# Patient Record
Sex: Female | Born: 1945
Health system: Southern US, Community
[De-identification: ages and names within clinical notes are randomized; demographics above are authoritative.]

## PROBLEM LIST (undated history)

## (undated) DIAGNOSIS — M1990 Unspecified osteoarthritis, unspecified site: Secondary | ICD-10-CM

## (undated) DIAGNOSIS — K219 Gastro-esophageal reflux disease without esophagitis: Secondary | ICD-10-CM

## (undated) DIAGNOSIS — E119 Type 2 diabetes mellitus without complications: Secondary | ICD-10-CM

## (undated) DIAGNOSIS — T7840XA Allergy, unspecified, initial encounter: Secondary | ICD-10-CM

## (undated) DIAGNOSIS — I10 Essential (primary) hypertension: Secondary | ICD-10-CM

## (undated) DIAGNOSIS — Z8619 Personal history of other infectious and parasitic diseases: Secondary | ICD-10-CM

## (undated) DIAGNOSIS — E785 Hyperlipidemia, unspecified: Secondary | ICD-10-CM

## (undated) DIAGNOSIS — R499 Unspecified voice and resonance disorder: Secondary | ICD-10-CM

## (undated) DIAGNOSIS — Z860100 Personal history of colon polyps, unspecified: Secondary | ICD-10-CM

## (undated) DIAGNOSIS — R251 Tremor, unspecified: Secondary | ICD-10-CM

## (undated) DIAGNOSIS — R739 Hyperglycemia, unspecified: Secondary | ICD-10-CM

## (undated) DIAGNOSIS — F419 Anxiety disorder, unspecified: Secondary | ICD-10-CM

## (undated) DIAGNOSIS — F410 Panic disorder [episodic paroxysmal anxiety] without agoraphobia: Secondary | ICD-10-CM

## (undated) HISTORY — DX: Panic disorder (episodic paroxysmal anxiety): F41.0

## (undated) HISTORY — PX: COLON SURGERY: SHX602

## (undated) HISTORY — PX: RECTOCELE REPAIR: SHX761

## (undated) HISTORY — DX: Anxiety disorder, unspecified: F41.9

## (undated) HISTORY — PX: ABDOMINAL HYSTERECTOMY: SHX81

## (undated) HISTORY — DX: Gastro-esophageal reflux disease without esophagitis: K21.9

## (undated) HISTORY — DX: Hyperglycemia, unspecified: R73.9

## (undated) HISTORY — PX: NASAL SINUS SURGERY: SHX719

## (undated) HISTORY — PX: TONSILLECTOMY: SUR1361

## (undated) HISTORY — PX: JOINT REPLACEMENT: SHX530

## (undated) HISTORY — PX: BLADDER SUSPENSION: SHX72

## (undated) HISTORY — DX: Personal history of colon polyps, unspecified: Z86.0100

## (undated) HISTORY — DX: Allergy, unspecified, initial encounter: T78.40XA

## (undated) HISTORY — DX: Unspecified voice and resonance disorder: R49.9

## (undated) HISTORY — DX: Personal history of other infectious and parasitic diseases: Z86.19

## (undated) HISTORY — DX: Tremor, unspecified: R25.1

## (undated) HISTORY — PX: PARTIAL HYSTERECTOMY: SHX80

## (undated) HISTORY — DX: Hyperlipidemia, unspecified: E78.5

## (undated) HISTORY — DX: Essential (primary) hypertension: I10

## (undated) HISTORY — DX: Personal history of colonic polyps: Z86.010

---

## 1998-09-17 HISTORY — PX: EYE SURGERY: SHX253

## 2004-09-18 LAB — HM DEXA SCAN: HM Dexa Scan: NORMAL

## 2004-09-27 ENCOUNTER — Ambulatory Visit: Payer: Self-pay | Admitting: Unknown Physician Specialty

## 2005-02-28 ENCOUNTER — Ambulatory Visit: Payer: Self-pay | Admitting: Unknown Physician Specialty

## 2005-04-30 ENCOUNTER — Ambulatory Visit: Payer: Self-pay | Admitting: Unknown Physician Specialty

## 2006-04-04 ENCOUNTER — Ambulatory Visit: Payer: Self-pay | Admitting: Unknown Physician Specialty

## 2006-04-05 ENCOUNTER — Ambulatory Visit: Payer: Self-pay | Admitting: Unknown Physician Specialty

## 2006-05-24 ENCOUNTER — Ambulatory Visit: Payer: Self-pay | Admitting: Unknown Physician Specialty

## 2006-06-06 ENCOUNTER — Ambulatory Visit: Payer: Self-pay | Admitting: Family Medicine

## 2006-06-07 ENCOUNTER — Ambulatory Visit: Payer: Self-pay | Admitting: Family Medicine

## 2006-06-12 ENCOUNTER — Ambulatory Visit: Payer: Self-pay | Admitting: Family Medicine

## 2006-06-14 ENCOUNTER — Ambulatory Visit: Payer: Self-pay | Admitting: Family Medicine

## 2006-06-14 ENCOUNTER — Other Ambulatory Visit: Admission: RE | Admit: 2006-06-14 | Discharge: 2006-06-14 | Payer: Self-pay | Admitting: Family Medicine

## 2006-06-14 ENCOUNTER — Encounter: Payer: Self-pay | Admitting: Family Medicine

## 2006-07-08 ENCOUNTER — Ambulatory Visit: Payer: Self-pay | Admitting: Cardiovascular Disease

## 2006-07-19 ENCOUNTER — Ambulatory Visit: Payer: Self-pay | Admitting: Unknown Physician Specialty

## 2006-07-19 LAB — HM COLONOSCOPY: HM Colonoscopy: ABNORMAL

## 2006-08-06 ENCOUNTER — Ambulatory Visit: Payer: Self-pay | Admitting: Family Medicine

## 2006-09-02 ENCOUNTER — Ambulatory Visit: Payer: Self-pay | Admitting: Cardiology

## 2006-10-21 ENCOUNTER — Ambulatory Visit: Payer: Self-pay | Admitting: Family Medicine

## 2007-01-20 ENCOUNTER — Encounter: Payer: Self-pay | Admitting: Family Medicine

## 2007-01-20 DIAGNOSIS — B029 Zoster without complications: Secondary | ICD-10-CM | POA: Insufficient documentation

## 2007-01-20 DIAGNOSIS — R498 Other voice and resonance disorders: Secondary | ICD-10-CM | POA: Insufficient documentation

## 2007-01-20 DIAGNOSIS — R059 Cough, unspecified: Secondary | ICD-10-CM | POA: Insufficient documentation

## 2007-01-20 DIAGNOSIS — R05 Cough: Secondary | ICD-10-CM | POA: Insufficient documentation

## 2007-01-20 DIAGNOSIS — T7840XA Allergy, unspecified, initial encounter: Secondary | ICD-10-CM | POA: Insufficient documentation

## 2007-01-28 DIAGNOSIS — E785 Hyperlipidemia, unspecified: Secondary | ICD-10-CM | POA: Insufficient documentation

## 2007-01-28 DIAGNOSIS — Z860100 Personal history of colon polyps, unspecified: Secondary | ICD-10-CM | POA: Insufficient documentation

## 2007-01-28 DIAGNOSIS — I10 Essential (primary) hypertension: Secondary | ICD-10-CM

## 2007-01-28 DIAGNOSIS — R32 Unspecified urinary incontinence: Secondary | ICD-10-CM | POA: Insufficient documentation

## 2007-01-28 DIAGNOSIS — K219 Gastro-esophageal reflux disease without esophagitis: Secondary | ICD-10-CM

## 2007-01-28 DIAGNOSIS — F411 Generalized anxiety disorder: Secondary | ICD-10-CM | POA: Insufficient documentation

## 2007-05-28 ENCOUNTER — Ambulatory Visit: Payer: Self-pay | Admitting: Family Medicine

## 2007-06-23 ENCOUNTER — Ambulatory Visit: Payer: Self-pay | Admitting: Family Medicine

## 2007-07-01 ENCOUNTER — Ambulatory Visit: Payer: Self-pay | Admitting: Professional

## 2007-07-08 ENCOUNTER — Ambulatory Visit: Payer: Self-pay | Admitting: Professional

## 2007-07-14 ENCOUNTER — Ambulatory Visit: Payer: Self-pay | Admitting: Professional

## 2007-07-14 ENCOUNTER — Telehealth: Payer: Self-pay | Admitting: Family Medicine

## 2007-07-22 ENCOUNTER — Ambulatory Visit: Payer: Self-pay | Admitting: Professional

## 2007-07-28 ENCOUNTER — Telehealth: Payer: Self-pay | Admitting: Family Medicine

## 2007-07-31 ENCOUNTER — Ambulatory Visit: Payer: Self-pay | Admitting: Professional

## 2007-08-04 ENCOUNTER — Ambulatory Visit: Payer: Self-pay | Admitting: Family Medicine

## 2007-08-04 DIAGNOSIS — F329 Major depressive disorder, single episode, unspecified: Secondary | ICD-10-CM

## 2007-08-06 LAB — CONVERTED CEMR LAB
ALT: 38 units/L — ABNORMAL HIGH (ref 0–35)
AST: 26 units/L (ref 0–37)
Albumin: 3.6 g/dL (ref 3.5–5.2)
Basophils Absolute: 0 10*3/uL (ref 0.0–0.1)
Bilirubin, Direct: 0.1 mg/dL (ref 0.0–0.3)
Calcium: 9.1 mg/dL (ref 8.4–10.5)
Chloride: 105 meq/L (ref 96–112)
Eosinophils Absolute: 0.1 10*3/uL (ref 0.0–0.6)
Eosinophils Relative: 1 % (ref 0.0–5.0)
GFR calc Af Amer: 94 mL/min
GFR calc non Af Amer: 78 mL/min
Glucose, Bld: 108 mg/dL — ABNORMAL HIGH (ref 70–99)
Lymphocytes Relative: 20.6 % (ref 12.0–46.0)
MCHC: 34.2 g/dL (ref 30.0–36.0)
MCV: 91.2 fL (ref 78.0–100.0)
Monocytes Relative: 7.1 % (ref 3.0–11.0)
Neutro Abs: 7.2 10*3/uL (ref 1.4–7.7)
Platelets: 326 10*3/uL (ref 150–400)
RBC: 4.82 M/uL (ref 3.87–5.11)
TSH: 3 microintl units/mL (ref 0.35–5.50)
WBC: 10.1 10*3/uL (ref 4.5–10.5)

## 2007-08-07 ENCOUNTER — Ambulatory Visit: Payer: Self-pay | Admitting: Professional

## 2007-08-19 ENCOUNTER — Ambulatory Visit: Payer: Self-pay | Admitting: Professional

## 2007-08-22 ENCOUNTER — Ambulatory Visit: Payer: Self-pay | Admitting: Family Medicine

## 2007-08-25 ENCOUNTER — Encounter (INDEPENDENT_AMBULATORY_CARE_PROVIDER_SITE_OTHER): Payer: Self-pay | Admitting: *Deleted

## 2007-08-25 LAB — FECAL OCCULT BLOOD, GUAIAC: Fecal Occult Blood: NEGATIVE

## 2007-09-02 ENCOUNTER — Ambulatory Visit: Payer: Self-pay | Admitting: Professional

## 2007-09-23 ENCOUNTER — Ambulatory Visit: Payer: Self-pay | Admitting: Professional

## 2007-10-09 ENCOUNTER — Encounter: Payer: Self-pay | Admitting: Family Medicine

## 2007-10-09 ENCOUNTER — Ambulatory Visit: Payer: Self-pay | Admitting: Family Medicine

## 2007-10-10 ENCOUNTER — Encounter (INDEPENDENT_AMBULATORY_CARE_PROVIDER_SITE_OTHER): Payer: Self-pay | Admitting: *Deleted

## 2007-12-02 ENCOUNTER — Ambulatory Visit: Payer: Self-pay | Admitting: Family Medicine

## 2007-12-02 DIAGNOSIS — R259 Unspecified abnormal involuntary movements: Secondary | ICD-10-CM | POA: Insufficient documentation

## 2007-12-02 DIAGNOSIS — R7303 Prediabetes: Secondary | ICD-10-CM | POA: Insufficient documentation

## 2007-12-02 DIAGNOSIS — R739 Hyperglycemia, unspecified: Secondary | ICD-10-CM

## 2007-12-04 LAB — CONVERTED CEMR LAB
Glucose, 1 Hour GTT: 160 mg/dL (ref 120–170)
Glucose, 2 hour: 140 mg/dL — ABNORMAL HIGH (ref 70–139)
Glucose, GTT - 3 Hour: 106 mg/dL

## 2007-12-17 ENCOUNTER — Ambulatory Visit: Payer: Self-pay | Admitting: Family Medicine

## 2008-03-18 ENCOUNTER — Ambulatory Visit: Payer: Self-pay | Admitting: Family Medicine

## 2008-03-24 ENCOUNTER — Ambulatory Visit: Payer: Self-pay | Admitting: Family Medicine

## 2008-04-21 ENCOUNTER — Telehealth: Payer: Self-pay | Admitting: Family Medicine

## 2008-04-22 ENCOUNTER — Telehealth (INDEPENDENT_AMBULATORY_CARE_PROVIDER_SITE_OTHER): Payer: Self-pay | Admitting: *Deleted

## 2008-04-26 ENCOUNTER — Ambulatory Visit: Payer: Self-pay | Admitting: Family Medicine

## 2008-04-27 LAB — CONVERTED CEMR LAB
Albumin: 3.7 g/dL (ref 3.5–5.2)
BUN: 20 mg/dL (ref 6–23)
Calcium: 9.2 mg/dL (ref 8.4–10.5)
Glucose, Bld: 105 mg/dL — ABNORMAL HIGH (ref 70–99)
Phosphorus: 3.7 mg/dL (ref 2.3–4.6)
Sodium: 139 meq/L (ref 135–145)

## 2008-06-18 ENCOUNTER — Encounter (INDEPENDENT_AMBULATORY_CARE_PROVIDER_SITE_OTHER): Payer: Self-pay | Admitting: *Deleted

## 2008-08-25 ENCOUNTER — Ambulatory Visit: Payer: Self-pay | Admitting: Family Medicine

## 2008-09-06 ENCOUNTER — Telehealth: Payer: Self-pay | Admitting: Family Medicine

## 2008-09-28 ENCOUNTER — Ambulatory Visit: Payer: Self-pay | Admitting: Family Medicine

## 2008-09-29 LAB — CONVERTED CEMR LAB
ALT: 20 units/L (ref 0–35)
Albumin: 3.9 g/dL (ref 3.5–5.2)
BUN: 17 mg/dL (ref 6–23)
Basophils Relative: 0.5 % (ref 0.0–3.0)
Bilirubin, Direct: 0.1 mg/dL (ref 0.0–0.3)
CO2: 29 meq/L (ref 19–32)
Calcium: 9.6 mg/dL (ref 8.4–10.5)
Creatinine, Ser: 0.9 mg/dL (ref 0.4–1.2)
Direct LDL: 251.3 mg/dL
Eosinophils Relative: 2.1 % (ref 0.0–5.0)
Glucose, Bld: 109 mg/dL — ABNORMAL HIGH (ref 70–99)
HCT: 44 % (ref 36.0–46.0)
Hemoglobin: 15.2 g/dL — ABNORMAL HIGH (ref 12.0–15.0)
Hgb A1c MFr Bld: 6.1 % — ABNORMAL HIGH (ref 4.6–6.0)
Lymphocytes Relative: 30.4 % (ref 12.0–46.0)
Monocytes Relative: 10.2 % (ref 3.0–12.0)
Neutro Abs: 4.4 10*3/uL (ref 1.4–7.7)
RBC: 4.96 M/uL (ref 3.87–5.11)
Total CHOL/HDL Ratio: 8.2
Total Protein: 6.9 g/dL (ref 6.0–8.3)
VLDL: 34 mg/dL (ref 0–40)

## 2008-10-04 ENCOUNTER — Telehealth: Payer: Self-pay | Admitting: Family Medicine

## 2008-10-04 ENCOUNTER — Ambulatory Visit: Payer: Self-pay | Admitting: Family Medicine

## 2008-10-04 DIAGNOSIS — N951 Menopausal and female climacteric states: Secondary | ICD-10-CM

## 2008-10-13 ENCOUNTER — Ambulatory Visit: Payer: Self-pay | Admitting: Family Medicine

## 2008-10-13 ENCOUNTER — Encounter: Payer: Self-pay | Admitting: Family Medicine

## 2008-10-15 ENCOUNTER — Encounter (INDEPENDENT_AMBULATORY_CARE_PROVIDER_SITE_OTHER): Payer: Self-pay | Admitting: *Deleted

## 2008-11-15 ENCOUNTER — Telehealth: Payer: Self-pay | Admitting: Family Medicine

## 2009-03-08 ENCOUNTER — Telehealth: Payer: Self-pay | Admitting: Family Medicine

## 2009-03-14 ENCOUNTER — Ambulatory Visit: Payer: Self-pay | Admitting: Family Medicine

## 2009-03-16 LAB — CONVERTED CEMR LAB
BUN: 16 mg/dL (ref 6–23)
Chloride: 105 meq/L (ref 96–112)
Creatinine, Ser: 0.9 mg/dL (ref 0.4–1.2)
Hgb A1c MFr Bld: 6.1 % (ref 4.6–6.5)

## 2009-03-23 ENCOUNTER — Ambulatory Visit: Payer: Self-pay | Admitting: Family Medicine

## 2009-05-25 ENCOUNTER — Ambulatory Visit: Payer: Self-pay | Admitting: Family Medicine

## 2009-06-29 ENCOUNTER — Telehealth: Payer: Self-pay | Admitting: Family Medicine

## 2009-07-19 ENCOUNTER — Ambulatory Visit: Payer: Self-pay | Admitting: Family Medicine

## 2009-09-20 ENCOUNTER — Ambulatory Visit: Payer: Self-pay | Admitting: Family Medicine

## 2009-09-20 LAB — CONVERTED CEMR LAB
Alkaline Phosphatase: 67 units/L (ref 39–117)
Basophils Absolute: 0 10*3/uL (ref 0.0–0.1)
Bilirubin, Direct: 0.1 mg/dL (ref 0.0–0.3)
CO2: 28 meq/L (ref 19–32)
Calcium: 9.7 mg/dL (ref 8.4–10.5)
Creatinine, Ser: 0.9 mg/dL (ref 0.4–1.2)
Eosinophils Absolute: 0.2 10*3/uL (ref 0.0–0.7)
Glucose, Bld: 105 mg/dL — ABNORMAL HIGH (ref 70–99)
HDL: 46.1 mg/dL (ref >39.00)
Hgb A1c MFr Bld: 6.1 % (ref 4.6–6.5)
Lymphocytes Relative: 30.5 % (ref 12.0–46.0)
MCHC: 32.7 g/dL (ref 30.0–36.0)
Neutrophils Relative %: 56.1 % (ref 43.0–77.0)
RBC: 4.97 M/uL (ref 3.87–5.11)
RDW: 12 % (ref 11.5–14.6)
Total Bilirubin: 1.1 mg/dL (ref 0.3–1.2)
Total CHOL/HDL Ratio: 7
Triglycerides: 162 mg/dL — ABNORMAL HIGH (ref 0.0–149.0)

## 2009-10-19 ENCOUNTER — Ambulatory Visit: Payer: Self-pay | Admitting: Family Medicine

## 2009-11-11 ENCOUNTER — Encounter: Payer: Self-pay | Admitting: Family Medicine

## 2009-11-11 ENCOUNTER — Ambulatory Visit: Payer: Self-pay | Admitting: Family Medicine

## 2009-11-11 LAB — HM MAMMOGRAPHY: HM Mammogram: NORMAL

## 2009-11-16 ENCOUNTER — Encounter (INDEPENDENT_AMBULATORY_CARE_PROVIDER_SITE_OTHER): Payer: Self-pay | Admitting: *Deleted

## 2010-04-11 ENCOUNTER — Ambulatory Visit: Payer: Self-pay | Admitting: Family Medicine

## 2010-04-18 ENCOUNTER — Telehealth: Payer: Self-pay | Admitting: Family Medicine

## 2010-07-10 ENCOUNTER — Telehealth: Payer: Self-pay | Admitting: Family Medicine

## 2010-07-11 ENCOUNTER — Ambulatory Visit: Payer: Self-pay | Admitting: Family Medicine

## 2010-10-17 NOTE — Progress Notes (Signed)
Summary: wants labs  Phone Note Call from Patient Call back at Home Phone [phone]   Caller: Patient Call For: dr tower Summary of Call: pt feels tired , no energy. wonders if the diuretic she is taking and her glucose levels are depleating her potassium, she has appt to see you in january but wants to have labs checked now Initial call taken by: Lowella Petties,  April 22, 2008 3:51 PM  Follow-up for Phone Call        please schedule for labs - renal profile 995.2-- thanks  Follow-up by: Judith Part MD,  April 22, 2008 5:16 PM  Additional Follow-up for Phone Call Additional follow up Details #1::        Advised patient.  ......................................................Marland KitchenNatasha Analia Zuk April 23, 2008 9:03 AM

## 2010-10-17 NOTE — Miscellaneous (Signed)
Summary: flu shot  Clinical Lists Changes  Observations: Added new observation of FLU VAX: Historical (06/17/2008 8:54)      Influenza Immunization History:    Influenza # 1:  Historical (06/17/2008)  pt given flu vacc at [facility] in [place] ([phone])  0.69ml fluvrin im L deltoid lot # [id number]

## 2010-10-17 NOTE — Progress Notes (Signed)
Summary: refill request for xanax  Phone Note Refill Request Message from:  Fax from Pharmacy  Refills Requested: Medication #1:  XANAX XR 0.5 MG XR24H-TAB 1 by mouth once daily.   Last Refilled: 01/16/2010 Faxed request from [facility] [address], [phone].  Initial call taken by: Lowella Petties CMA,  April 18, 2010 3:43 PM  Follow-up for Phone Call        she was given 58 with 3 refils in feb- that would have covered her for the year  Follow-up by: Judith Part MD,  April 18, 2010 3:59 PM  Additional Follow-up for Phone Call Additional follow up Details #1::        Spoke with Zella Ball at [facility] . He will ck with pt about written rx given to pt 10/2009.Lewanda Rife LPN  April 18, 2010 5:30 PM

## 2010-10-17 NOTE — Letter (Signed)
Summary: Results Follow up Letter  [place] [facility]  [address] [place], [place] [address]   Phone: [phone]  Fax: [phone]    11/16/2009 MRN: [id number]    [facility] [address] [place], [place]  [address]    Dear Ms. [place],  The following are the results of your recent test(s):  Test         Result    Pap Smear:        Normal _____  Not Normal _____ Comments: ______________________________________________________ Cholesterol: LDL(Bad cholesterol):         Your goal is less than:         HDL (Good cholesterol):       Your goal is more than: Comments:  ______________________________________________________ Mammogram:        Normal __X___  Not Normal _____ Comments:  Yearly follow up is recommended.   ___________________________________________________________________ Hemoccult:        Normal _____  Not normal _______ Comments:    _____________________________________________________________________ Other Tests:    We routinely do not discuss normal results over the telephone.  If you desire a copy of the results, or you have any questions about this information we can discuss them at your next office visit.   Sincerely,    [name], M.D.  MAT:lsf

## 2010-10-17 NOTE — Miscellaneous (Signed)
  Clinical Lists Changes  Observations: Added new observation of MAMMO DUE: 11/11/2010 (11/11/2009 16:52) Added new observation of MAMMOGRAM: Normal (11/11/2009 16:52)

## 2010-10-17 NOTE — Miscellaneous (Signed)
Summary: hemo results  Clinical Lists Changes  Observations: Added new observation of HEMOCCULT: negative x 3 (08/25/2007 8:05)       Preventive Care Screening  Hemoccult:    Date:  08/25/2007    Results:  negative x 3

## 2010-10-17 NOTE — Assessment & Plan Note (Signed)
Summary: 6 mo. f/u/bir   Vital Signs:  Patient profile:   65 year old female Height:      63 inches Weight:      171 pounds BMI:     30.40 Temp:     97.9 degrees F oral Pulse rate:   80 / minute Pulse rhythm:   regular BP sitting:   140 / 86  (left arm) Cuff size:   regular  Vitals Entered By: Liane Comber CMA (March 23, 2009 11:21 AM)  History of Present Illness: here for follow up of hyperglycemia/ mild DM , and HTN and dep/anx is overall doing just fine   last AIC recent 6.1-- overall stable  last opthy-- just had exam in winter 2009- Dr Clayborne Dana  diet- good overall -- but wants to loose more weight  is following DM diet the best she can ? if zoloft makes her gain had to stop walking due to R achilles tendon  wt is down one pound   is going on a cruise  ? what would be safe for sea sickness -- does not think she will have a problem  has never been on one  does like to swim   has some hand weights    HTN- bp is up a bit today at 140/86 pt ran out of amlodipine 2 days ago-- that is why bp is high    stress- mother in law is sick in [place]- has to go back and forth  anxiety is about the same- overall functioning very well  is interested in weaning off zoloft -- taking every other day-- is doing okwith that   Allergies: 1)  ! * Zetia 2)  ! Pneumovax 23 3)  Penicillin 4)  Zocor 5)  Zestril 6)  * Crestor 7)  Lipitor  Past History:  Past Surgical History: Last updated: 10/04/2008 Hysterectomy- partial- with fibroid  Sinus surgery Bladder susp. 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 Eyes:  Denies blurring and eye pain. CV:  Denies chest pain or discomfort, palpitations, shortness of breath with exertion, and swelling of feet. Resp:  Denies cough and wheezing. GI:  Denies abdominal pain, bloody stools, change in bowel habits, and indigestion. MS:  Denies joint pain. Derm:  Denies itching, poor wound healing, and rash. Neuro:  Denies numbness and tingling. Psych:  Denies panic attacks and sense of great danger. Endo:  Denies cold intolerance, excessive thirst, excessive urination, and heat intolerance.  Physical Exam  General:  overweight but generally well appearing  Head:  normocephalic, atraumatic, and no abnormalities observed.   Neck:  supple with full rom and no masses or thyromegally, no JVD or carotid bruit  Lungs:  Normal respiratory effort, chest expands  symmetrically. Lungs are clear to auscultation, no crackles or wheezes. Heart:  Normal rate and regular rhythm. S1 and S2 normal without gallop, murmur, click, rub or other extra sounds. Abdomen:  soft, non-tender, normal bowel sounds, and no masses.   Msk:  No deformity or scoliosis noted of thoracic or lumbar spine.  no acute joint changes  Pulses:  R and L carotid,radial,femoral,dorsalis pedis and posterior tibial pulses are full and equal bilaterally Extremities:  No clubbing, cyanosis, edema, or deformity noted with normal full range of motion of all joints.   Neurologic:  sensation intact to light touch, gait normal, and DTRs  symmetrical and normal.  very slt hand tremor Skin:  Intact without suspicious lesions or rashes lentigos diffusely Cervical Nodes:  No lymphadenopathy noted Psych:  normal affect, talkative and pleasant   Diabetes Management Exam:    Foot Exam (with socks and/or shoes not present):       Sensory-Pinprick/Light touch:          Left medial foot (L-4): normal          Left dorsal foot (L-5): normal          Left lateral foot (S-1): normal          Right medial foot (L-4): normal          Right dorsal foot (L-5): normal          Right lateral foot (S-1): normal       Sensory-Monofilament:          Left foot: normal          Right foot: normal       Inspection:          Left foot: normal          Right foot: normal       Nails:          Left foot: normal          Right foot: normal    Eye Exam:       Eye Exam done elsewhere          Date: 08/17/2008          Results: normal          Done by: Dr Clayborne Dana   Impression & Recommendations:  Problem # 1:  HYPERTENSION (ICD-401.9) Assessment Deteriorated  bp up off amlodipine- px given - to restart  will update if bp does not imp on it  labs and PE planned 6 mo  work on low impact exercise  Her updated medication list for this problem includes:    Altace 10 Mg Caps (Ramipril) .Marland Kitchen... 1 by mouth once daily    Triamterene-hctz 37.5-25 Mg Tabs (Triamterene-hctz) .Marland Kitchen... Take on half tab by mouth daily    Amlodipine Besylate 5 Mg Tabs (Amlodipine besylate) .Marland Kitchen... Take one by mouth daily  BP today: 140/86 Prior BP: 130/80 (10/04/2008)  Labs Reviewed: K+: 4.2 (03/14/2009) Creat: : 0.9 (03/14/2009)   Chol: 345 (09/28/2008)   HDL: 42.0 (09/28/2008)   LDL: DEL (09/28/2008)   TG: 172 (09/28/2008)  Problem # 2:  DIABETES MELLITUS, TYPE II, MILD (ICD-250.00)  mild DM stable with good diet  disc healthy diet (low simple sugar/ choose complex carbs/ low sat fat) diet and exercise in detail  opthy utd  labs in 6 mo then f/u PE  aim to get [facility]  under 6 with wt loss  Her updated medication list for this problem includes:  Altace 10 Mg Caps (Ramipril) .Marland Kitchen... 1 by mouth once daily  Problem # 3:  HYPERLIPIDEMIA (ICD-272.4) Assessment: Comment Only  very high- intol to meds pt has choosen not to re check at this time disc low sat fat diet pt understands risks  Labs Reviewed: SGOT: 22 (09/28/2008)   SGPT: 20 (09/28/2008)   HDL:42.0 (09/28/2008)  LDL:DEL (09/28/2008)  Chol:345 (09/28/2008)  Trig:172 (09/28/2008)  Problem # 4:  DEPRESSION (ICD-311) Assessment: Improved overall gradually imp is slowly weaning zoloft to every other day  will update if problems  Her updated medication list for this problem includes:    Alprazolam 1 Mg Xr24h-tab (Alprazolam) .Marland Kitchen... 1 by mouth once daily    Zoloft 100 Mg Tabs (Sertraline hcl) .Marland Kitchen... Take one by mouth daily  Complete Medication List: 1)  Altace 10 Mg Caps (Ramipril) .Marland Kitchen.. 1 by mouth once daily 2)  Prilosec Otc 20 Mg Tbec (Omeprazole magnesium) .... Take 1 tablet by mouth two times a day 3)  Zyrtec 10 Mg Tabs (Cetirizine hcl) .... Take one by mouth daily 4)  Triamterene-hctz 37.5-25 Mg Tabs (Triamterene-hctz) .... Take on half tab by mouth daily 5)  Premarin 0.45 Mg Tabs (Estrogens conjugated) .... Take one by mouth daily 6)  Amlodipine Besylate 5 Mg Tabs (Amlodipine besylate) .... Take one by mouth daily 7)  Fish Oil 1200 Mg Caps (Omega-3 fatty acids) .... Take one by mouth daily 8)  Multi-vitamin Tabs (Multiple vitamin) .... Take by mouth daily as directed 9)  Sm Calcium 600 +d Tabs (Calcium carbonate-vitamin d tabs) .... Take 2 by mouth daily 10)  Alprazolam 1 Mg Xr24h-tab (Alprazolam) .Marland Kitchen.. 1 by mouth once daily 11)  Zoloft 100 Mg Tabs (Sertraline hcl) .... Take one by mouth daily  Patient Instructions: 1)  keep watching diet carefully 2)  try to add exercise at least 20 minutes per day -- you may need to consider low impact - like bike or water -- try to vary it on different  days  3)  start back on amlodipine - since your blood pressure is up  4)  watch salt in diet  5)  schedule fasting labs in 6 months and then follow up for PE wellness prof v70.0, AIC  Prescriptions: ALPRAZOLAM 1 MG XR24H-TAB (ALPRAZOLAM) 1 by mouth once daily  #90 x 1   Entered and Authorized by:   Judith Part MD   Signed by:   Judith Part MD on 03/23/2009   Method used:   Print then Give to Patient   RxID:   [id number] PRILOSEC OTC 20 MG TBEC (OMEPRAZOLE MAGNESIUM) Take 1 tablet by mouth two times a day  #180 x 1   Entered and Authorized by:   Judith Part MD   Signed by:   Judith Part MD on 03/23/2009   Method used:   Print then Give to Patient   RxID:   [id number] AMLODIPINE BESYLATE 5 MG TABS (AMLODIPINE BESYLATE) Take one by mouth daily  #90 x 1   Entered and Authorized by:   Judith Part MD   Signed by:   Judith Part MD on 03/23/2009   Method used:   Print then Give to Patient   RxID:   [id number] TRIAMTERENE-HCTZ 37.5-25 MG TABS (TRIAMTERENE-HCTZ) take on half tab by mouth daily  #45 x 1   Entered and Authorized by:   Judith Part MD   Signed by:   Judith Part MD on 03/23/2009   Method  used:   Print then Give to Patient   RxID:   [id number] ALTACE 10 MG CAPS (RAMIPRIL) 1 by mouth once daily  #90 x 1   Entered and Authorized by:   Judith Part MD   Signed by:   Judith Part MD on 03/23/2009   Method used:   Print then Give to Patient   RxID:   [id number]   Prior Medications (reviewed today): ALTACE 10 MG CAPS (RAMIPRIL) 1 by mouth once daily PRILOSEC OTC 20 MG TBEC (OMEPRAZOLE MAGNESIUM) Take 1 tablet by mouth two times a day ZYRTEC 10 MG TABS (CETIRIZINE HCL) Take one by mouth daily TRIAMTERENE-HCTZ 37.5-25 MG TABS (TRIAMTERENE-HCTZ) take on half tab by mouth daily PREMARIN 0.45 MG TABS (ESTROGENS CONJUGATED) Take one by mouth daily AMLODIPINE BESYLATE 5 MG TABS (AMLODIPINE BESYLATE) Take one by mouth  daily FISH OIL 1200 MG CAPS (OMEGA-3 FATTY ACIDS) Take one by mouth daily MULTI-VITAMIN  TABS (MULTIPLE VITAMIN) Take by mouth daily as directed SM CALCIUM 600 +D  TABS (CALCIUM CARBONATE-VITAMIN D TABS) Take 2 by mouth daily ALPRAZOLAM 1 MG XR24H-TAB (ALPRAZOLAM) 1 by mouth once daily ZOLOFT 100 MG  TABS (SERTRALINE HCL) take one by mouth daily Current Allergies (reviewed today): ! * ZETIA ! PNEUMOVAX 23 PENICILLIN ZOCOR ZESTRIL * CRESTOR LIPITOR Current Medications (including changes made in today's visit):  ALTACE 10 MG CAPS (RAMIPRIL) 1 by mouth once daily PRILOSEC OTC 20 MG TBEC (OMEPRAZOLE MAGNESIUM) Take 1 tablet by mouth two times a day ZYRTEC 10 MG TABS (CETIRIZINE HCL) Take one by mouth daily TRIAMTERENE-HCTZ 37.5-25 MG TABS (TRIAMTERENE-HCTZ) take on half tab by mouth daily PREMARIN 0.45 MG TABS (ESTROGENS CONJUGATED) Take one by mouth daily AMLODIPINE BESYLATE 5 MG TABS (AMLODIPINE BESYLATE) Take one by mouth daily FISH OIL 1200 MG CAPS (OMEGA-3 FATTY ACIDS) Take one by mouth daily MULTI-VITAMIN  TABS (MULTIPLE VITAMIN) Take by mouth daily as directed SM CALCIUM 600 +D  TABS (CALCIUM CARBONATE-VITAMIN D TABS) Take 2 by mouth daily ALPRAZOLAM 1 MG XR24H-TAB (ALPRAZOLAM) 1 by mouth once daily ZOLOFT 100 MG  TABS (SERTRALINE HCL) take one by mouth daily    Preventive Care Screening     rxn to pneumovax 2004- severe swelling

## 2010-10-17 NOTE — Progress Notes (Signed)
Summary: alprazolam   Phone Note Refill Request Message from:  Fax from Pharmacy on July 10, 2010 11:32 AM  Refills Requested: Medication #1:  XANAX XR 0.5 MG XR24H-TAB 1 by mouth once daily.   Last Refilled: 04/18/2010 [facility] [address] [phone].  Initial call taken by: Melody Comas,  July 10, 2010 11:33 AM  Follow-up for Phone Call        I gave her #90 with 3 ref in feb of 2011-- she should not need refils  Follow-up by: Judith Part MD,  July 10, 2010 11:37 AM  Additional Follow-up for Phone Call Additional follow up Details #1::        Spoke with steve at [facility]. His computer went down while we were talking. Brett Canales said he would call our office back when his computer comes back up.Lewanda Rife LPN  July 10, 2010 2:56 PM   [facility] and was told Xanax refills expire after 6 months.so they do not have refills for the pt.Please advise. Lewanda Rife LPN  July 10, 2010 4:20 PM     Additional Follow-up for Phone Call Additional follow up Details #2::    thanks- I did not know that px written on EMR for call in  Follow-up by: Judith Part MD,  July 10, 2010 4:37 PM  Additional Follow-up for Phone Call Additional follow up Details #3:: Details for Additional Follow-up Action Taken: [facility]ication [facility] pharmacy as instructed. Lewanda Rife LPN  July 10, 2010 5:19 PM   Prescriptions: XANAX XR 0.5 MG XR24H-TAB (ALPRAZOLAM) 1 by mouth once daily  #90 x 1   Entered and Authorized by:   Judith Part MD   Signed by:   Lewanda Rife LPN on 52/84/1324   Method used:   Telephoned to ...       [facility] [phone]* (retail)       [address] [place], [place]  [address]       Ph: [phone] or [phone]       Fax: [phone]   RxID:   [id number]

## 2010-10-17 NOTE — Progress Notes (Signed)
Summary: zoloft rx ([facility] pt)  Phone Note Refill Request Message from:  Patient on March 08, 2009 2:10 PM  Refills Requested: Medication #1:  ZOLOFT 100 MG  TABS take one by mouth daily.   Last Refilled: 11/22/2008    Ph[facility]acy[facility]        [facility]. [phone]*   Pharmacy Phone:  [phone]  Initial call taken by: Liane Comber,  March 08, 2009 2:10 PM      Prescriptions: ZOLOFT 100 MG  TABS (SERTRALINE HCL) take one by mouth daily  #30 x 5   Entered and Authorized by:   Shaune Leeks MD   Signed by:   Shaune Leeks MD on 03/08/2009   Method used:   Electronically to        [facility] [phone]* (retail)       [address] [place], [place]  [address]       Ph: [phone] or [phone]       Fax: [phone]   RxID:   [id number]

## 2010-10-17 NOTE — Assessment & Plan Note (Signed)
Summary: FLU SHOT  CYD  Nurse Visit   Allergies: 1)  ! * Zetia 2)  ! Pneumovax 23 3)  Penicillin 4)  Zocor 5)  Zestril 6)  * Crestor 7)  Lipitor  Orders Added: 1)  Admin 1st Vaccine [90471] 2)  Flu Vaccine 97yrs + [13086]  Flu Vaccine Consent Questions     Do you have a history of severe allergic reactions to this vaccine? no    Any prior history of allergic reactions to egg and/or gelatin? no    Do you have a sensitivity to the preservative Thimersol? no    Do you have a past history of Guillan-Barre Syndrome? no    Do you currently have an acute febrile illness? no    Have you ever had a severe reaction to latex? no    Vaccine information given and explained to patient? yes    Are you currently pregnant? no    Lot Number:[id number]   Exp Date:03/17/2011   Site Given  Left Deltoid IM

## 2010-10-17 NOTE — Assessment & Plan Note (Signed)
Summary: SHINGLES VACCINE/DLO  Nurse Visit   Allergies: 1)  ! * Zetia 2)  ! Pneumovax 23 3)  Penicillin 4)  Zocor 5)  Zestril 6)  * Crestor 7)  Lipitor  Immunizations Administered:  Zostavax # 1:    Vaccine Type: Zostavax    Site: left deltoid    Mfr: Merck    Dose: 0.65 ml    Route: [place]    Given by: Liane Comber CMA (AAMA)    Exp. Date: 01/12/2010    Lot #: [id number]    VIS given: 06/29/05 given May 25, 2009.  Orders Added: 1)  Zoster (Shingles) Vaccine Live [90736] 2)  Admin 1st Vaccine [phone]

## 2010-10-17 NOTE — Assessment & Plan Note (Signed)
Summary: CPX/BIR   Vital Signs:  Patient profile:   65 year old female Height:      63.75 inches Weight:      194 pounds BMI:     33.68 Temp:     97.9 degrees F oral Pulse rate:   72 / minute Pulse rhythm:   regular BP sitting:   130 / 82  (left arm) Cuff size:   large  Vitals Entered By: Lowella Petties CMA (October 19, 2009 2:27 PM) CC: 30 minute check up   History of Present Illness: here for health mt exam   is doing pretty good overall   got off zoloft this summer- and feels a lot better, not a bit depressed  then she weaned off xanax - got down to .5 mg per day off of totally 1 mo and then anx started to return with poor sleep - tolerated for a while  then worse in the ams  went back on it .5 xr per day -- is feeling good on that  wants to stay on this -- may try to wean one more time  stress level is about the same   when she was anxious -- she got a "scalded tougue feeling"  ? if due to gerd or anx that is improved too -- is almost gone   acid reflux started to act up -- now is a little better  this occasionally made her cough (of note not for the altace)  once took a pill for gastric motility  (? if it was reglan)  this is gradually getting better  ? if needs to inc prilosec  did gain significant weight   wt is up 23 lb this visit  is really working on that -- is walking every am for 50 minutes  has not been eating differently   bp 130/82- stable on current meds  hyperglycemia is stable with AIC of 6.1- no change   last colonosc 07-- is due 2012 with hx of polyps and fam hx of ca   lipids high -- intol meds with LDL of 227.8 is taking the fish oil and exercising   dexa 2006 nl  ca and D is good about that twice per day   pap nl 07 hysterectomy- no gyn symptoms , no new partners    mam 1/10 self exam - no lumps or changes   TD 1/10 flu shot utd  pneumovax 04 -- bad local rxn to it  had her shingles vaccine     Allergies: 1)  ! *  Zetia 2)  ! Pneumovax 23 3)  Penicillin 4)  Zocor 5)  Zestril 6)  * Crestor 7)  Lipitor  Past History:  Past Medical History: Last updated: 03/23/2009 Anxiety / panic disorder depression voice disorder Colonic polyps, hx of GERD Hyperlipidemia (very high chol intol of statins- does not want to check it) Hypertension hyperglycemia tremor- from anx Urinary incontinence- mixed all rhinitis- gets allergy shot from Dr Chestine Spore  had shingles at age 51- mild   ENT -- Dr Chestine Spore  Past Surgical History: Last updated: 10/04/2008 Hysterectomy- partial- with fibroid  Sinus surgery Bladder susp. 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 Eyes:  Denies blurring and eye irritation. CV:  Denies chest pain or discomfort, palpitations, and shortness of breath with exertion. Resp:  Denies cough and wheezing. GI:  Complains of indigestion; denies abdominal pain and change in bowel habits. GU:  Denies abnormal vaginal bleeding, discharge, and dysuria. MS:  Denies cramps and muscle weakness. Derm:  Denies lesion(s), poor wound healing, and rash. Neuro:  Denies numbness and tingling. Psych:  Complains of anxiety; denies depression. Endo:  Denies cold intolerance, excessive thirst, excessive urination, and heat intolerance. Heme:  Denies abnormal bruising and bleeding.  Physical Exam  General:  overweight but generally well appearing  Head:  normocephalic, atraumatic, and no abnormalities observed.    Eyes:  vision grossly intact, pupils equal, pupils round, and pupils reactive to light.  no conjunctival pallor, injection or icterus  Ears:  R ear normal and L ear normal.   Nose:  no nasal discharge.   Mouth:  pharynx pink and moist.   Neck:  supple with full rom and no masses or thyromegally, no JVD or carotid bruit  Chest Wall:  No deformities, masses, or tenderness noted. Breasts:  No mass, nodules, thickening, tenderness, bulging, retraction, inflamation, nipple discharge or skin changes noted.   Lungs:  Normal respiratory effort, chest expands symmetrically. Lungs are clear to auscultation, no crackles or wheezes. Heart:  Normal rate and regular rhythm. S1 and S2 normal without gallop, murmur, click, rub or other extra sounds. Abdomen:  Bowel sounds positive,abdomen soft and non-tender without masses, organomegaly or hernias noted. no renal bruits  Msk:  No deformity or scoliosis noted of thoracic or lumbar spine.  no acute joint changes  Pulses:  R and L carotid,radial,femoral,dorsalis pedis and posterior tibial pulses are full and equal bilaterally Extremities:  No clubbing, cyanosis, edema, or deformity noted with normal full range of motion of all joints.   Neurologic:  sensation intact to light touch, gait normal, and DTRs symmetrical and normal.  very slt hand tremor Skin:  Intact without suspicious lesions or rashes Cervical Nodes:  No lymphadenopathy noted Axillary Nodes:  No palpable lymphadenopathy Inguinal Nodes:  No significant adenopathy Psych:  normal affect, talkative and pleasant   Diabetes Management Exam:    Foot Exam (with socks and/or shoes not present):       Sensory-Pinprick/Light touch:          Left medial foot (L-4): normal          Left dorsal foot (L-5): normal          Left lateral foot (S-1): normal          Right medial foot (L-4): normal          Right dorsal foot (L-5): normal          Right lateral foot (S-1): normal       Sensory-Monofilament:           Left foot: normal          Right foot: normal       Inspection:          Left foot: normal          Right foot: normal       Nails:          Left foot: normal          Right foot: normal  Impression & Recommendations:  Problem # 1:  HEALTH MAINTENANCE EXAM (ICD-V70.0) Assessment Comment Only reviewed health habits including diet, exercise and skin cancer prevention reviewed health maintenance list and family history rev labs today  Problem # 2:  OTHER SCREENING MAMMOGRAM (ICD-V76.12) Assessment: Comment Only annual mammogram scheduled adv pt to continue regular self breast exams non remarkable breast exam today  Orders: Radiology Referral (Radiology)  Problem # 3:  DIABETES MELLITUS, TYPE II, MILD (ICD-250.00) Assessment: Unchanged  overall stable with diet control disc healthy diet (low simple sugar/ choose complex carbs/ low sat fat) diet and exercise in detail  plan lab and f/u in 6 mo  Her updated medication list for this problem includes:    Altace 10 Mg Caps (Ramipril) .Marland Kitchen... 1 by mouth once daily  Labs Reviewed: Creat: 0.9 (09/20/2009)     Last Eye Exam: normal (08/17/2008) Reviewed HgBA1c results: 6.1 (09/20/2009)  6.1 (03/14/2009)  Problem # 4:  HYPERTENSION (ICD-401.9) Assessment: Unchanged  good control with current meds rev labs disc healthy diet (low simple sugar/ choose complex carbs/ low sat fat) diet and exercise in detail  f.u in 6 mo  Her updated medication list for this problem includes:    Altace 10 Mg Caps (Ramipril) .Marland Kitchen... 1 by mouth once daily    Triamterene-hctz 37.5-25 Mg Tabs (Triamterene-hctz) .Marland Kitchen... Take on half tab by mouth daily    Amlodipine Besylate 5 Mg Tabs (Amlodipine besylate) .Marland Kitchen... Take one by mouth daily  BP today: 130/82 Prior BP: 140/86 (03/23/2009)  Labs Reviewed: K+: 4.4 (09/20/2009) Creat: : 0.9 (09/20/2009)   Chol: 312 (09/20/2009)   HDL: 46.10 (09/20/2009)   LDL: DEL (09/28/2008)   TG: 162.0  (09/20/2009)  Problem # 5:  HYPERLIPIDEMIA (ICD-272.4) Assessment: Unchanged  very high- intol to meds pt has choosen not to re check at this time disc low sat fat diet pt understands risks  Labs Reviewed: SGOT: 22 (09/28/2008)   SGPT: 20 (09/28/2008)   HDL:42.0 (09/28/2008)  LDL:DEL (09/28/2008)  Chol:345 (09/28/2008)  Trig:172 (09/28/2008)  Problem # 6:  GERD (ICD-530.81) Assessment: Deteriorated worse - and pt does not want to change ppi brand  will inc to three times a day prilosec exp imp with wt loss f/u 6 mo  Her updated medication list for this problem includes:    Prilosec Otc 20 Mg Tbec (Omeprazole magnesium) .Marland Kitchen... Take 1 tablet by mouth three times a day  Problem # 7:  ANXIETY (ICD-300.00) Assessment: Deteriorated now again stable on xanax xr - will continue current dose also stressed imp of exercise The following medications were removed from the medication list:    Zoloft 100 Mg Tabs (Sertraline hcl) .Marland Kitchen... Take one by mouth daily Her updated medication list for this problem includes:    Xanax Xr 0.5 Mg Xr24h-tab (Alprazolam) .Marland Kitchen... 1 by mouth once daily  Complete Medication List: 1)  Altace 10 Mg Caps (Ramipril) .Marland Kitchen.. 1 by mouth once daily 2)  Prilosec Otc 20 Mg Tbec (Omeprazole magnesium) .... Take 1 tablet by mouth three times a day 3)  Zyrtec 10 Mg Tabs (Cetirizine hcl) .... Take one by mouth daily 4)  Triamterene-hctz 37.5-25 Mg Tabs (Triamterene-hctz) .... Take on half tab by mouth daily 5)  Premarin 0.3 Mg Tabs (Estrogens conjugated) .Marland Kitchen.. 1 by mouth once daily 6)  Amlodipine Besylate 5 Mg Tabs (Amlodipine besylate) .... Take one by mouth daily 7)  Fish Oil 1200 Mg Caps (Omega-3 fatty acids) .... Take one by mouth daily 8)  Multi-vitamin Tabs (  Multiple vitamin) .... Take by mouth daily as directed 9)  Sm Calcium 600 +d Tabs (Calcium carbonate-vitamin d tabs) .... Take 2 by mouth daily 10)  Xanax Xr 0.5 Mg Xr24h-tab (Alprazolam) .Marland Kitchen.. 1 by mouth once  daily  Patient Instructions: 1)  we will schedule mammogram at check out  2)  continue the xanax xr at current dose  3)  increase prilosec to three times per day- if reflux symptoms do not improve let me know  4)  schedule lab in 6 mo and then follow up AIC, microalbumin 5)  keep working on low sugar/ fat diet and exercise for weight loss  Prescriptions: XANAX XR 0.5 MG XR24H-TAB (ALPRAZOLAM) 1 by mouth once daily  #90 x 3   Entered and Authorized by:   Judith Part MD   Signed by:   Judith Part MD on 10/19/2009   Method used:   Print then Give to Patient   RxID:   [id number] PREMARIN 0.3 MG TABS (ESTROGENS CONJUGATED) 1 by mouth once daily  #90 x 3   Entered and Authorized by:   Judith Part MD   Signed by:   Judith Part MD on 10/19/2009   Method used:   Print then Give to Patient   RxID:   [id number] AMLODIPINE BESYLATE 5 MG TABS (AMLODIPINE BESYLATE) Take one by mouth daily  #90 x 3   Entered and Authorized by:   Judith Part MD   Signed by:   Judith Part MD on 10/19/2009   Method used:   Print then Give to Patient   RxID:   [id number] TRIAMTERENE-HCTZ 37.5-25 MG TABS (TRIAMTERENE-HCTZ) take on half tab by mouth daily  #3 months x 3   Entered and Authorized by:   Judith Part MD   Signed by:   Judith Part MD on 10/19/2009   Method used:   Print then Give to Patient   RxID:   [id number] PRILOSEC OTC 20 MG TBEC (OMEPRAZOLE MAGNESIUM) Take 1 tablet by mouth three times a day  #3 months x 3   Entered and Authorized by:   Judith Part MD   Signed by:   Judith Part MD on 10/19/2009   Method used:   Print then Give to Patient   RxID:   [id number] ALTACE 10 MG CAPS (RAMIPRIL) 1 by mouth once daily  #90 x 3   Entered and Authorized by:   Judith Part MD   Signed by:   Judith Part MD on 10/19/2009   Method used:   Print then Give to Patient   RxID:   [id number]   Prior Medications (reviewed today): ALTACE  10 MG CAPS (RAMIPRIL) 1 by mouth once daily ZYRTEC 10 MG TABS (CETIRIZINE HCL) Take one by mouth daily TRIAMTERENE-HCTZ 37.5-25 MG TABS (TRIAMTERENE-HCTZ) take on half tab by mouth daily FISH OIL 1200 MG CAPS (OMEGA-3 FATTY ACIDS) Take one by mouth daily MULTI-VITAMIN  TABS (MULTIPLE VITAMIN) Take by mouth daily as directed SM CALCIUM 600 +D  TABS (CALCIUM CARBONATE-VITAMIN D TABS) Take 2 by mouth daily XANAX XR 0.5 MG XR24H-TAB (ALPRAZOLAM) 1 by mouth once daily Current Allergies: ! * ZETIA ! PNEUMOVAX 23 PENICILLIN ZOCOR ZESTRIL * CRESTOR LIPITOR

## 2010-10-20 ENCOUNTER — Telehealth: Payer: Self-pay | Admitting: Family Medicine

## 2010-10-25 NOTE — Progress Notes (Signed)
Summary: pt has IBS flare up  Phone Note Call from Patient Call back at [phone]   Caller: Patient Summary of Call: Pt is out of town in [place] and she is having a bout of IBS.  She is asking for something to be called in to help with the cramps, something sublingual- Levsin?, to be [phone] [place].  She is also asking if there is something she can take to help coat her digestive track to help with irritation. She is seeing a lot of mucous with a little bit of blood.  She says she knows that if she gets bad enough she will need to go to ER.  Please call pt and let her know. Initial call taken by: Lowella Petties CMA, AAMA,  October 20, 2010 10:04 AM  Follow-up for Phone Call        looks like she will be due for colonosc this fall - but if no improvement at return we need to get her a GI appt if worse go to ER there- esp if fever or worse pain! px written on EMR for call in - levsin Follow-up by: Judith Part MD,  October 20, 2010 10:39 AM  Additional Follow-up for Phone Call Additional follow up Details #1::        Patient notified as instructed by telephone. Medication phoned to [facility] [phone] pharmacy as instructed. Pt stated bleeding has stopped.Lewanda Rife LPN  October 20, 2010 11:41 AM     New/Updated Medications: LEVSIN/SL 0.125 MG SUBL (HYOSCYAMINE SULFATE) 1 under toungue up to every 4-6 hours as needed cramps Prescriptions: LEVSIN/SL 0.125 MG SUBL (HYOSCYAMINE SULFATE) 1 under toungue up to every 4-6 hours as needed cramps  #30 x 0   Entered and Authorized by:   Judith Part MD   Signed by:   Lewanda Rife LPN on 57/84/6962   Method used:   Telephoned to ...       [facility] [phone]* (retail)       [address] [place], [place]  [address]       Ph: [phone] or [phone]       Fax: [phone]   RxID:   [id number]

## 2010-10-27 ENCOUNTER — Other Ambulatory Visit: Payer: Self-pay

## 2010-11-13 ENCOUNTER — Encounter: Payer: Self-pay | Admitting: Family Medicine

## 2010-11-27 ENCOUNTER — Encounter: Payer: Self-pay | Admitting: Family Medicine

## 2010-12-14 ENCOUNTER — Telehealth: Payer: Self-pay | Admitting: Family Medicine

## 2010-12-14 DIAGNOSIS — Z0000 Encounter for general adult medical examination without abnormal findings: Secondary | ICD-10-CM

## 2010-12-14 DIAGNOSIS — E119 Type 2 diabetes mellitus without complications: Secondary | ICD-10-CM

## 2010-12-14 DIAGNOSIS — E785 Hyperlipidemia, unspecified: Secondary | ICD-10-CM

## 2010-12-14 NOTE — Telephone Encounter (Signed)
Message copied by Roxy Manns on Thu Dec 14, 2010  8:22 AM ------      Message from: Margarite Gouge, NATASHA      Created: Wed Dec 13, 2010  1:07 PM      Regarding: cpx labs mon       Please order  future cpx labs for pt's upcomming lab appt.      Thanks      Rodney Booze

## 2010-12-14 NOTE — Telephone Encounter (Signed)
Wellness , lipid and AIC

## 2010-12-18 ENCOUNTER — Other Ambulatory Visit (INDEPENDENT_AMBULATORY_CARE_PROVIDER_SITE_OTHER): Payer: BC Managed Care – PPO | Admitting: Family Medicine

## 2010-12-18 DIAGNOSIS — Z0000 Encounter for general adult medical examination without abnormal findings: Secondary | ICD-10-CM

## 2010-12-18 DIAGNOSIS — E119 Type 2 diabetes mellitus without complications: Secondary | ICD-10-CM

## 2010-12-18 LAB — COMPREHENSIVE METABOLIC PANEL
ALT: 24 U/L (ref 0–35)
Albumin: 3.9 g/dL (ref 3.5–5.2)
BUN: 16 mg/dL (ref 6–23)
CO2: 26 mEq/L (ref 19–32)
Calcium: 9.5 mg/dL (ref 8.4–10.5)
Chloride: 96 mEq/L (ref 96–112)
Creatinine, Ser: 0.8 mg/dL (ref 0.4–1.2)
GFR: 77.73 mL/min (ref >60.00)

## 2010-12-18 LAB — CBC WITH DIFFERENTIAL/PLATELET
Basophils Absolute: 0 10*3/uL (ref 0.0–0.1)
Basophils Relative: 0.5 % (ref 0.0–3.0)
Hemoglobin: 14.6 g/dL (ref 12.0–15.0)
Lymphocytes Relative: 37 % (ref 12.0–46.0)
Monocytes Relative: 9.2 % (ref 3.0–12.0)
Neutro Abs: 4.4 10*3/uL (ref 1.4–7.7)
RBC: 4.83 Mil/uL (ref 3.87–5.11)

## 2010-12-18 LAB — LDL CHOLESTEROL, DIRECT: Direct LDL: 237 mg/dL

## 2010-12-18 LAB — LIPID PANEL
Cholesterol: 328 mg/dL — ABNORMAL HIGH (ref 0–200)
VLDL: 33.6 mg/dL (ref 0.0–40.0)

## 2010-12-18 LAB — TSH: TSH: 6.33 u[IU]/mL — ABNORMAL HIGH (ref 0.35–5.50)

## 2010-12-26 ENCOUNTER — Encounter: Payer: Self-pay | Admitting: Family Medicine

## 2010-12-26 ENCOUNTER — Ambulatory Visit (INDEPENDENT_AMBULATORY_CARE_PROVIDER_SITE_OTHER): Payer: BC Managed Care – PPO | Admitting: Family Medicine

## 2010-12-26 DIAGNOSIS — R4589 Other symptoms and signs involving emotional state: Secondary | ICD-10-CM

## 2010-12-26 DIAGNOSIS — E785 Hyperlipidemia, unspecified: Secondary | ICD-10-CM

## 2010-12-26 DIAGNOSIS — F439 Reaction to severe stress, unspecified: Secondary | ICD-10-CM

## 2010-12-26 DIAGNOSIS — R6889 Other general symptoms and signs: Secondary | ICD-10-CM

## 2010-12-26 DIAGNOSIS — T7840XA Allergy, unspecified, initial encounter: Secondary | ICD-10-CM

## 2010-12-26 DIAGNOSIS — N951 Menopausal and female climacteric states: Secondary | ICD-10-CM

## 2010-12-26 DIAGNOSIS — I10 Essential (primary) hypertension: Secondary | ICD-10-CM

## 2010-12-26 DIAGNOSIS — R7989 Other specified abnormal findings of blood chemistry: Secondary | ICD-10-CM | POA: Insufficient documentation

## 2010-12-26 DIAGNOSIS — F411 Generalized anxiety disorder: Secondary | ICD-10-CM

## 2010-12-26 DIAGNOSIS — Z86010 Personal history of colonic polyps: Secondary | ICD-10-CM

## 2010-12-26 DIAGNOSIS — E119 Type 2 diabetes mellitus without complications: Secondary | ICD-10-CM

## 2010-12-26 DIAGNOSIS — Z1231 Encounter for screening mammogram for malignant neoplasm of breast: Secondary | ICD-10-CM

## 2010-12-26 DIAGNOSIS — Z0000 Encounter for general adult medical examination without abnormal findings: Secondary | ICD-10-CM

## 2010-12-26 MED ORDER — RAMIPRIL 10 MG PO CAPS: 10.0000 mg | ORAL_CAPSULE | Freq: Every day | ORAL | Status: DC

## 2010-12-26 MED ORDER — ALPRAZOLAM ER 0.5 MG PO TB24: 0.5000 mg | ORAL_TABLET | Freq: Every day | ORAL | Status: DC

## 2010-12-26 MED ORDER — TRIAMTERENE-HCTZ 37.5-25 MG PO TABS: 1.0000 | ORAL_TABLET | Freq: Every day | ORAL | Status: DC

## 2010-12-26 MED ORDER — AZELASTINE HCL 0.15 % NA SOLN: 2.0000 | Freq: Two times a day (BID) | NASAL | Status: DC | PRN

## 2010-12-26 MED ORDER — OMEPRAZOLE MAGNESIUM 20 MG PO TBEC: 20.0000 mg | DELAYED_RELEASE_TABLET | Freq: Two times a day (BID) | ORAL | Status: DC

## 2010-12-26 MED ORDER — ESTROGENS CONJUGATED 0.3 MG PO TABS: 0.3000 mg | ORAL_TABLET | Freq: Every day | ORAL | Status: DC

## 2010-12-26 MED ORDER — AMLODIPINE BESYLATE 5 MG PO TABS: 5.0000 mg | ORAL_TABLET | Freq: Every day | ORAL | Status: DC

## 2010-12-26 NOTE — Assessment & Plan Note (Signed)
Still very high -- even with fair diet Failed numerous statins and pt is not open to any other opt for tx  Will need to investigate thyroid issue  Rev labs with pt in detail Rev low sat fat diet also

## 2010-12-26 NOTE — Assessment & Plan Note (Signed)
Due for colonosc in nov- 5 year Also fam hx colon cancer  Will call in fall to schedule - pt aware

## 2010-12-26 NOTE — Assessment & Plan Note (Signed)
Stable and diet controlled Rev low glycemic diet  Needs to re start exercise Will sched own eye exam

## 2010-12-26 NOTE — Assessment & Plan Note (Signed)
Reviewed health habits including diet and exercise and skin cancer prevention Also reviewed health mt list, fam hx and immunizations  Rev wellness labs in detail Enc to keep working on wt loss

## 2010-12-26 NOTE — Assessment & Plan Note (Signed)
Mam ordered Nl breast exam  Enc self exams monthly

## 2010-12-26 NOTE — Assessment & Plan Note (Signed)
Good control No changes in therapy Enc further wt loss

## 2010-12-26 NOTE — Assessment & Plan Note (Signed)
Worse with stress- but this hopefully will calm down Refilled her xanax xr  She does not want ssri at this time

## 2010-12-26 NOTE — Assessment & Plan Note (Signed)
We disc her situation caring for MIL in detail  Disc strategies and coping  Offered counseling if she wants it Does not want ssri at this time

## 2010-12-26 NOTE — Patient Instructions (Addendum)
We will refer you for mammogram at check out Please call us in October to schedule colonoscopy for November Schedule non fasting labs for thyroid profile in 3 months please

## 2010-12-26 NOTE — Progress Notes (Signed)
Subjective:    Patient ID: [name], female    DOB: 1945/11/08, 65 y.o.   MRN: [id number]  HPI Here for health mt exam and to review chronic med problems   Is feeling fairly good in general A lot of stress- MIL is sick in [place] and traveling a lot  A lot of hospital and fast food- so not optimal  Made her ibs bad (stress) and then she had some rectal bleeding after taking some nsaids  Has internal hemorroids - per Dr Markham Jordan   Anxiety got worse -- in past was on zoloft in the past Had to take some extra short acting xanax when she was in [place]  She did not tolerate well   Wt is down 8 lb  HTN is well controlled with 128/76 today No headaches or other problems   colonosc was 11/07  - will be due for that in nov  Hx polyps Father had colon cancer   Pt had partial hyst for fibroids No symptoms or problems   Mam 2/11  - has not had this year's yet  Self exam - no lumps or problems   dexa nl times 2 in past Thinks last one was within the past 5 years  Ca and vit D-- is good about that  Working in the yard a lot - not a lot of time to exercise   Td 1/10  DM  A1c is 6.4-- fairly stable from 6.3 last check Is diet controlled  opthy  Lipids -- still extremely high with LDL 237  (was 227) Intolerant of multiple statins  Does not want to try other meds   tsh is a little high at 6.33- new for her  Never had it in the past  She is not symptomatic -- except for being out of whack from stress   Past Medical History  Diagnosis Date  . Anxiety   . Panic disorder   . Depression   . Voice disorder   . Hx of colonic polyp   . GERD (gastroesophageal reflux disease)   . Hyperlipemia     Very high chol intol of statins-does not want to check it  . Hypertension   . Hyperglycemia   . Tremor     from anx  . Urinary incontinence     mixed  . Allergic rhinitis     gets allergy shot from Dr. Chestine Spore  . History of shingles     at age 89- mild    Past Surgical History    Procedure Date  . Partial hysterectomy     with fibroid  . Nasal sinus surgery   . Bladder suspension   . Rectocele repair    History   Social History  . Marital Status: Married    Spouse Name: N/A    Number of Children: 2  . Years of Education: N/A   Occupational History  . 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 Smoking status: Never Smoker   . Smokeless tobacco: Not on file  . Alcohol Use: Not on file  . Drug Use: Not on file  . Sexually Active: Not on file   Other Topics Concern  . Not on file   Social History Narrative  . No narrative on file    Family History  Problem Relation Age of Onset  . Diabetes Mother   . Hyperlipidemia Mother   . Hypertension Mother   .  Osteoarthritis Mother   . Cancer Father     Colon         Review of Systems  Constitutional: Positive for fatigue. Negative for chills.  HENT: Negative for congestion and tinnitus.   Eyes: Negative for pain and visual disturbance.  Respiratory: Negative for cough.   Cardiovascular: Negative for chest pain, palpitations and leg swelling.  Gastrointestinal: Negative for constipation.  Genitourinary: Negative for urgency, frequency, vaginal bleeding and vaginal discharge.  Musculoskeletal: Negative for back pain and joint swelling.  Skin: Negative for pallor and rash.  Neurological: Negative for dizziness, tremors and numbness.  Hematological: Negative for adenopathy. Does not bruise/bleed easily.  Psychiatric/Behavioral: Negative for suicidal ideas. The patient is nervous/anxious.        Objective:   Physical Exam  Constitutional: She is oriented to person, place, and time. She appears well-developed and well-nourished.       overwt and well appearing   HENT:  Head: Normocephalic and atraumatic.  Right Ear: External ear normal.  Left Ear: External ear normal.  Nose: Nose normal.  Mouth/Throat: Oropharynx is clear and moist.  Eyes: Conjunctivae  and EOM are normal. Pupils are equal, round, and reactive to light.  Neck: Normal range of motion. Neck supple. No JVD present. Carotid bruit is not present. No thyromegaly present.  Cardiovascular: Normal rate, regular rhythm and normal heart sounds.   Pulmonary/Chest: Breath sounds normal. She has no wheezes.  Abdominal: Soft. Bowel sounds are normal. She exhibits no mass. There is no tenderness.  Genitourinary: No breast swelling, tenderness, discharge or bleeding.  Musculoskeletal: Normal range of motion. She exhibits no edema and no tenderness.  Lymphadenopathy:    She has no cervical adenopathy.  Neurological: She is alert and oriented to person, place, and time. She has normal reflexes. She displays normal reflexes. No cranial nerve deficit.  Skin: Skin is warm and dry.  Psychiatric: She has a normal mood and affect.       Slightly anxioius today           Assessment & Plan:

## 2010-12-26 NOTE — Assessment & Plan Note (Signed)
Worse with pollen Given px for astepro Will update if not helpful

## 2010-12-26 NOTE — Assessment & Plan Note (Signed)
New slt elevated tsh Will need to follow Not particularly symptomatic - reviewed Chol high Plan to check thyroid prof in 3 mo

## 2010-12-26 NOTE — Assessment & Plan Note (Signed)
Disc risks and benefits for hrt in detail incl breast ca and vascular events/ blood clots In light of recent stress wants to continue for a bit longer  Will hope to wean soon

## 2010-12-28 ENCOUNTER — Ambulatory Visit: Payer: Self-pay | Admitting: Family Medicine

## 2010-12-28 LAB — HM MAMMOGRAPHY: HM Mammogram: NEGATIVE

## 2010-12-29 ENCOUNTER — Encounter: Payer: Self-pay | Admitting: Family Medicine

## 2011-01-12 ENCOUNTER — Encounter: Payer: Self-pay | Admitting: Family Medicine

## 2011-02-02 NOTE — Assessment & Plan Note (Signed)
[facility]                               LIPID CLINIC NOTE   NAME:[name], [name]                           MRN:          [id number]  DATE:07/08/2006                            DOB:          [birth year]    Patient is seen in the lipid clinic for further evaluation and  medication titration associated with her hyperlipidemia.  Patient had  shoulder and muscle pain initially started with Lipitor, so she had to  stop taking that 2 weeks.  Previously, she has taken Zetia, flax seed  oil, fish oil and Zocor.  The patient tolerated Zocor for a number of  years prior to addition of Zetia, but then at time her muscles aches and  pains began.  Patient exercises rarely due to plantar fasciitis.  She is  thinking about trying water aerobics.  She follows a low-fat, low-  cholesterol diet, and usually stays away from fried foods.   CURRENT MEDICATIONS:  1. Calcium.  2. Multivitamin.  3. Fish oil.  4. Flax seed  5. Prilosec  6. Zyrtec.  7. Metoclopramide  8. Altace 10 daily.  9. Amlodipine 5 daily.  10.Triamterine/hydrochlorothiazide 1/2 tablet daily.  11.Premarin 0.45 mg daily.  12.Lipitor 10 mg daily that was discontinue 2 weeks ago.   PAST MEDICAL HISTORY:  1. Pertinent for gastroesophageal reflux disease.  2. Hypertension.  3. Cholesterol mixed.  4. Incontinence.  5. Dysphonia.  6. Anxiety.  7. Zoster.   Current labs for the patient collected at Dr. Royden Purl office reveal a  total cholesterol of 329 collected on June 06, 2006, triglyceride  123, HDL 50.1, LDL 256.6.  LFTs at that time were normal.   PHYSICAL EXAMINATION:  Weight 181 pounds.  Blood pressure is 120/70.  Heart rate is 68.   PLAN:  The patient follows a low-fat, low-cholesterol diet.  She does  not exercise, but will continue to work on that.  The patient will begin  Zocor 40 mg daily.  She will call with questions or problems in the  meantime.  Prescriptions are provided to  facilitate compliance.  Followup is scheduled in 6 to 8 weeks.   Thank you for the opportunity to see the patient.      Shelby Dubin, PharmD, BCPS, CPP  Electronically Signed      Rollene Rotunda, MD, [facility]  Electronically Signed   MP/MedQ  DD: 08/16/2006  DT: 08/17/2006  Job #: [id number]   cc:   Savannah A. Milinda Antis, MD

## 2011-02-02 NOTE — Assessment & Plan Note (Signed)
[facility]                               LIPID CLINIC NOTE   NAME:[name], [name]                           MRN:          [id number]  DATE:09/02/2006                            DOB:          02-06-1946    The patient has been feeling and doing well overall since her last visit  in the Lipid Clinic.  She has been compliant with her simvastatin 40 mg  p.o. daily at bedtime.  She has had no muscle aches, pains, weakness,  fever or other problems.  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  She is considering  retirement at the end of this school session.  The patient has had a  cold, but is slowly but surely resolving from that.   PAST MEDICAL HISTORY:  Pertinent for:  1. Gastroesophageal reflux disease.  2. Hypertension.  3. Hypercholesterolemia.  4. MEDICATION INTOLERANCES ASSOCIATED WITH HYPERCHOLESTEROLEMIA.  5. Anxiety.  6. Panic disorder.   CURRENT MEDICATIONS:  1. Calcium with vitamin D daily.  2. Multivitamin daily.  3. Fish oil daily.  4. Flax seed oil daily.  5. Prilosec 20 mg twice daily.  6. Zyrtec 10 mg daily.  7. Metoclopramide 10 mg daily.  8. Altace 10 mg daily.  9. Amlodipine 5 mg daily.  10.Maxzide 1/2 tablet daily.  11.Premarin 0.45 mg daily.  12.Simvastatin 40 mg daily at bedtime.   DRUG ALLERGIES:  PENICILLIN, ZOCOR, ZETIA, CRESTOR and LIPITOR, all  other cholesterol medications which caused muscle pain.   REVIEW OF SYSTEMS:  As stated in the HPI and otherwise negative.   PHYSICAL EXAMINATION:  Weight today in the office is 175 pounds, blood  pressure is 124/76, respirations are 16.  Labs have been put into the chart.   ASSESSMENT:  The patient has done well thus far, and is currently  maintaining good improvement on simvastatin at 40 mg daily.  Certainly  we do have additional work to move the patient towards an ultimate goal  of an LDL of less than 100, but I would  like to give her approximately 3  months to get beyond the holidays before we push further or try  additional combo therapy.  The patient will call with questions or  problems in the meantime.  She will have labs drawn in 12 weeks at the  [facility] office, and then a followup appointment with me in  approximately 13 weeks.  I appreciate the opportunity to see this kind  patient.      [name], PharmD, BCPS, CPP  Electronically Signed      Savannah Rotunda, MD, [facility]  Electronically Signed   MP/MedQ  DD: 09/06/2006  DT: 09/06/2006  Job #: [id number]   cc:   Savannah A. Milinda Antis, MD

## 2011-03-27 ENCOUNTER — Other Ambulatory Visit (INDEPENDENT_AMBULATORY_CARE_PROVIDER_SITE_OTHER): Payer: BC Managed Care – PPO | Admitting: Family Medicine

## 2011-03-27 DIAGNOSIS — R7989 Other specified abnormal findings of blood chemistry: Secondary | ICD-10-CM

## 2011-03-27 DIAGNOSIS — R6889 Other general symptoms and signs: Secondary | ICD-10-CM

## 2011-03-28 LAB — T3 UPTAKE: T3 Uptake: 28.3 % (ref 22.5–37.0)

## 2011-07-18 ENCOUNTER — Ambulatory Visit (INDEPENDENT_AMBULATORY_CARE_PROVIDER_SITE_OTHER): Payer: Medicare Other

## 2011-07-18 DIAGNOSIS — Z23 Encounter for immunization: Secondary | ICD-10-CM

## 2011-09-24 ENCOUNTER — Ambulatory Visit: Payer: Self-pay | Admitting: Unknown Physician Specialty

## 2011-09-28 ENCOUNTER — Other Ambulatory Visit: Payer: Self-pay | Admitting: Internal Medicine

## 2011-09-28 NOTE — Telephone Encounter (Signed)
Spoke with [facility] said can only refill for 6 months but would give refill thru April 2013.  Jamie please schedule CPX. Thank you.

## 2011-09-28 NOTE — Telephone Encounter (Signed)
Patient requesting refill. Last seen on 12/26/10.

## 2011-09-28 NOTE — Telephone Encounter (Signed)
?   Not due yet -- until April -- confirm that please Also schedule her PE for April or may-thanks

## 2011-10-02 MED ORDER — ALPRAZOLAM ER 0.5 MG PO TB24: 0.5000 mg | ORAL_TABLET | Freq: Every day | ORAL | Status: DC

## 2011-10-02 NOTE — Telephone Encounter (Signed)
Spoke with pt's husband and pt will call back to set up CPX.

## 2011-12-24 ENCOUNTER — Other Ambulatory Visit: Payer: Self-pay | Admitting: Family Medicine

## 2011-12-24 ENCOUNTER — Other Ambulatory Visit: Payer: Self-pay

## 2011-12-24 NOTE — Telephone Encounter (Signed)
Patient has an appt 01/15/2012.

## 2011-12-24 NOTE — Telephone Encounter (Signed)
On 09/28/11 [facility] was to give Alprazolam 0.5 #90 x 1 refill. Did not list the refill. Spoke with pharmacist today and he will update 09-28-11 to have one refill. Pt already has appt with Dr Milinda Antis 01/15/12. Patient notified as instructed by telephone that [facility] has alprazolam 0.5 mg refill for pt to pick up.

## 2011-12-25 ENCOUNTER — Other Ambulatory Visit: Payer: Self-pay

## 2011-12-25 NOTE — Telephone Encounter (Signed)
Pt left v/m that [facility] did not have refill for Alprazolam. I spoke with Bridgette at [facility] and she said pt had already picked up. I called pt on cell phone and pt said disregard her call she had picked up her med.

## 2012-01-15 ENCOUNTER — Ambulatory Visit (INDEPENDENT_AMBULATORY_CARE_PROVIDER_SITE_OTHER): Payer: Medicare Other | Admitting: Family Medicine

## 2012-01-15 ENCOUNTER — Encounter: Payer: Self-pay | Admitting: Family Medicine

## 2012-01-15 VITALS — BP 122/72 | HR 73 | Temp 97.9°F | Ht 63.75 in | Wt 185.8 lb

## 2012-01-15 DIAGNOSIS — E669 Obesity, unspecified: Secondary | ICD-10-CM

## 2012-01-15 DIAGNOSIS — F411 Generalized anxiety disorder: Secondary | ICD-10-CM

## 2012-01-15 DIAGNOSIS — Z860100 Personal history of colon polyps, unspecified: Secondary | ICD-10-CM

## 2012-01-15 DIAGNOSIS — R7989 Other specified abnormal findings of blood chemistry: Secondary | ICD-10-CM

## 2012-01-15 DIAGNOSIS — E119 Type 2 diabetes mellitus without complications: Secondary | ICD-10-CM

## 2012-01-15 DIAGNOSIS — N951 Menopausal and female climacteric states: Secondary | ICD-10-CM

## 2012-01-15 DIAGNOSIS — Z1231 Encounter for screening mammogram for malignant neoplasm of breast: Secondary | ICD-10-CM

## 2012-01-15 DIAGNOSIS — I10 Essential (primary) hypertension: Secondary | ICD-10-CM

## 2012-01-15 DIAGNOSIS — E785 Hyperlipidemia, unspecified: Secondary | ICD-10-CM

## 2012-01-15 LAB — COMPREHENSIVE METABOLIC PANEL
AST: 23 U/L (ref 0–37)
BUN: 15 mg/dL (ref 6–23)
Calcium: 9.4 mg/dL (ref 8.4–10.5)
Chloride: 100 mEq/L (ref 96–112)
Creatinine, Ser: 0.8 mg/dL (ref 0.4–1.2)
GFR: 79.79 mL/min (ref >60.00)
Glucose, Bld: 105 mg/dL — ABNORMAL HIGH (ref 70–99)

## 2012-01-15 LAB — CBC WITH DIFFERENTIAL/PLATELET
Basophils Relative: 0.6 % (ref 0.0–3.0)
Eosinophils Absolute: 0.2 10*3/uL (ref 0.0–0.7)
Eosinophils Relative: 2.5 % (ref 0.0–5.0)
Hemoglobin: 15.2 g/dL — ABNORMAL HIGH (ref 12.0–15.0)
Lymphocytes Relative: 36 % (ref 12.0–46.0)
MCHC: 34 g/dL (ref 30.0–36.0)
MCV: 86.7 fl (ref 78.0–100.0)
Neutro Abs: 4.9 10*3/uL (ref 1.4–7.7)
RBC: 5.14 Mil/uL — ABNORMAL HIGH (ref 3.87–5.11)

## 2012-01-15 LAB — LIPID PANEL
HDL: 53.4 mg/dL (ref >39.00)
VLDL: 26.2 mg/dL (ref 0.0–40.0)

## 2012-01-15 LAB — TSH: TSH: 1.87 u[IU]/mL (ref 0.35–5.50)

## 2012-01-15 LAB — LDL CHOLESTEROL, DIRECT: Direct LDL: 242.8 mg/dL

## 2012-01-15 MED ORDER — TRIAMTERENE-HCTZ 37.5-25 MG PO TABS: 1.0000 | ORAL_TABLET | Freq: Every day | ORAL | Status: DC

## 2012-01-15 MED ORDER — RAMIPRIL 10 MG PO CAPS: 10.0000 mg | ORAL_CAPSULE | Freq: Every day | ORAL | Status: DC

## 2012-01-15 MED ORDER — OMEPRAZOLE MAGNESIUM 20 MG PO TBEC: 20.0000 mg | DELAYED_RELEASE_TABLET | Freq: Two times a day (BID) | ORAL | Status: DC

## 2012-01-15 NOTE — Assessment & Plan Note (Signed)
Again disc pros/ cons/ risks of HRT and pt chooses to continue due to severe/ disabling vasomotor symptoms and anxiety if she stops it  Is tolerating lower dose however She is aware of risk of breast cancer/ blood clot/ cv risks

## 2012-01-15 NOTE — Assessment & Plan Note (Signed)
Discussed how this problem influences overall health and the risks it imposes  Reviewed plan for weight loss with lower calorie diet (via better food choices and also portion control or program like weight watchers) and exercise building up to or more than 30 minutes 5 days per week including some aerobic activity    

## 2012-01-15 NOTE — Assessment & Plan Note (Signed)
Lipids are very high - baseline / with fam hx  Pt intol of all chol meds unfortunately  Will check lipids today Rev low sat fat diet in detail

## 2012-01-15 NOTE — Assessment & Plan Note (Signed)
utd colonosc this year  5 year f/u due to fam hx

## 2012-01-15 NOTE — Assessment & Plan Note (Signed)
Had normalized last summer Re check today  No symptoms

## 2012-01-15 NOTE — Patient Instructions (Addendum)
We will schedule mammogram at check out  Labs today Keep working on healthy diet and exercise for cholesterol and weight loss  Avoid red meat/ fried foods/ egg yolks/ fatty breakfast meats/ butter, cheese and high fat dairy/ and shellfish   Keep avoiding sugar and watch portions of starches- eat whole grain when you can

## 2012-01-15 NOTE — Progress Notes (Signed)
Subjective:    Patient ID: [name], female    DOB: 1945/10/05, 66 y.o.   MRN: [id number]  HPI Here for check up of chronic medical conditions and to review health mt list   Is doing well  Feels quite good overall  Lots of stress- caring for 66 year old MIL - with lung tumor   Wt is stable with bmi of 32  bp is 122/72    Today No cp or palpitations or headaches or edema  No side effects to medicines    Has hx of hyperglycemia Due for labs Lab Results  Component Value Date   HGBA1C 6.4 12/18/2010   diet- has been fair - tries to do well / but slips occas - recently ate out a lot with her daughter  Avoids sweets but still eats too many carbs  Exercise- building up on that - walks 5-6 d per week for 30 min  Has achilles tendon problems- but getting by   Lipids - due for check Lab Results  Component Value Date   CHOL 328* 12/18/2010   HDL 55.50 12/18/2010   LDLDIRECT 237.0 12/18/2010   TRIG 168.0* 12/18/2010   CHOLHDL 6 12/18/2010    Diet - avoids red meat and eats fish / lean meats and no fried foods  Is intolerant of statins - and all other chol meds   Menopause symptoms - going down on her dose to .3  She is not ready to go off it   Hx of abn tsh- due for check Lab Results  Component Value Date   TSH 1.62 03/27/2011   was ok last time   dexa nl 2 times in past No fx hx  Ca and D- is very good about that   Had pneumovax past - allergic to it so cannot update that Last one she had a very bad reaction - entire arm and neck swelled   Had partial hyst in past  No hx of abn paps  Not supra cervical    mammo 4.12- is due for that  Self exam-no lumps or changes   colonosc 1`/13 Dr Markham Jordan did it - has fam hx of colon cancer and will get one every 5 years  Patient Active Problem List  Diagnoses  . HERPES ZOSTER  . Hyperglycemia  . HYPERLIPIDEMIA  . ANXIETY  . DEPRESSION  . HYPERTENSION  . GERD  . MENOPAUSAL SYNDROME  . TREMOR  . DYSPHONIA  . COUGH, CHRONIC  .  URINARY INCONTINENCE  . ALLERGY  . COLONIC POLYPS, HX OF  . Routine general medical examination at a health care facility  . Other screening mammogram  . Stress reaction, emotional  . Abnormal TSH  . Obesity   Past Medical History  Diagnosis Date  . Anxiety   . Panic disorder   . Depression   . Voice disorder   . Hx of colonic polyp   . GERD (gastroesophageal reflux disease)   . Hyperlipemia     Very high chol intol of statins-does not want to check it  . Hypertension   . Hyperglycemia   . Tremor     from anx  . Urinary incontinence     mixed  . Allergic rhinitis     gets allergy shot from Dr. Chestine Spore  . History of shingles     at age 45- mild   Past Surgical History  Procedure Date  . Partial hysterectomy     with fibroid  . Nasal  sinus surgery   . Bladder suspension   . Rectocele repair    History  Substance Use Topics  . Smoking status: Never Smoker   . Smokeless tobacco: Not on file  . Alcohol Use: Not on file   Family History  Problem Relation Age of Onset  . Diabetes Mother   . Hyperlipidemia Mother   . Hypertension Mother   . Osteoarthritis Mother   . Cancer Father     Colon   Allergies  Allergen Reactions  . Atorvastatin     REACTION: shoulder pain  . Ezetimibe     REACTION: ?  . Lisinopril     REACTION: muscle pain  . Penicillins     REACTION: reaction not known  . Pneumococcal Vaccine Polyvalent     REACTION: severe local reaction  . Rosuvastatin     REACTION: leg pain  . Simvastatin     REACTION: muscle pain   Current Outpatient Prescriptions on File Prior to Visit  Medication Sig Dispense Refill  . ALPRAZolam (XANAX XR) 0.5 MG 24 hr tablet Take 1 tablet (0.5 mg total) by mouth daily.  90 tablet  3  . amLODipine (NORVASC) 5 MG tablet TAKE 1 TABLET EVERY DAY  90 tablet  3  . Azelastine HCl (ASTEPRO) 0.15 % SOLN 2 sprays by Nasal route 2 (two) times daily as needed.  30 mL  11  . Calcium Carbonate-Vitamin D (CALCIUM 600+D PO) Take by  mouth 2 (two) times daily.        . cetirizine (ZYRTEC) 10 MG tablet Take 10 mg by mouth daily.        . hyoscyamine (LEVSIN SL) 0.125 MG SL tablet Use one under tongue up to every 4-6 hours as needed for cramps       . Multiple Vitamin (MULTIVITAMIN) tablet Take 1 tablet by mouth daily.        Marland Kitchen PREMARIN 0.3 MG tablet TAKE 1 TABLET EVERY DAY  90 tablet  3  . DISCONTD: omeprazole (PRILOSEC OTC) 20 MG tablet Take 1 tablet (20 mg total) by mouth 2 (two) times daily.  180 tablet  3  . DISCONTD: ramipril (ALTACE) 10 MG capsule TAKE 1 CAPSULE EVERY DAY  90 capsule  3  . DISCONTD: triamterene-hydrochlorothiazide (MAXZIDE-25) 37.5-25 MG per tablet Take 1 tablet by mouth daily. Take one half tablet by mouth daily  90 tablet  3  . Omega-3 Fatty Acids (FISH OIL) 1200 MG CAPS Take by mouth daily.           Review of Systems    Review of Systems  Constitutional: Negative for fever, appetite change, and unexpected weight change. pos for fatigue  Eyes: Negative for pain and visual disturbance.  Respiratory: Negative for cough and shortness of breath.   Cardiovascular: Negative for cp or palpitations    Gastrointestinal: Negative for nausea, diarrhea and constipation.  Genitourinary: Negative for urgency and frequency.  Skin: Negative for pallor or rash   Neurological: Negative for weakness, light-headedness, numbness and headaches.  Hematological: Negative for adenopathy. Does not bruise/bleed easily.  Psychiatric/Behavioral: Negative for dysphoric mood. Anxiety is fairly controlled      Objective:   Physical Exam  Constitutional: She appears well-developed and well-nourished. No distress.       Obese and well appearing   HENT:  Head: Normocephalic and atraumatic.  Right Ear: External ear normal.  Left Ear: External ear normal.  Nose: Nose normal.  Mouth/Throat: Oropharynx is clear and moist.  Eyes: Conjunctivae  and EOM are normal. Pupils are equal, round, and reactive to light. No scleral  icterus.  Neck: Normal range of motion. Neck supple. No JVD present. Carotid bruit is not present. No thyromegaly present.  Cardiovascular: Normal rate, regular rhythm, normal heart sounds and intact distal pulses.  Exam reveals no gallop.   Pulmonary/Chest: Effort normal and breath sounds normal. No respiratory distress. She has no wheezes.  Abdominal: Soft. Bowel sounds are normal. She exhibits no distension, no abdominal bruit and no mass. There is no tenderness.  Genitourinary: No breast swelling, tenderness, discharge or bleeding.       Breast exam: No mass, nodules, thickening, tenderness, bulging, retraction, inflamation, nipple discharge or skin changes noted.  No axillary or clavicular LA.  Chaperoned exam.    Musculoskeletal: Normal range of motion. She exhibits no edema and no tenderness.  Lymphadenopathy:    She has no cervical adenopathy.  Neurological: She is alert. She has normal reflexes. No cranial nerve deficit. She exhibits normal muscle tone. Coordination normal.  Skin: Skin is warm and dry. No rash noted. No erythema. No pallor.       Solar lentigos diffusely   Psychiatric: She has a normal mood and affect.          Assessment & Plan:

## 2012-01-15 NOTE — Assessment & Plan Note (Signed)
a1c today and update  Rev low glycemic diet and exercise- pt could do better Stressed imp of exercise

## 2012-01-15 NOTE — Assessment & Plan Note (Signed)
bp in fair control at this time  No changes needed  Disc lifstyle change with low sodium diet and exercise  Lab today 

## 2012-01-15 NOTE — Assessment & Plan Note (Signed)
Controlled well with xanax - pt cut dose successfully and doing well  Will continue to follow and px with med with caution

## 2012-01-15 NOTE — Assessment & Plan Note (Signed)
Scheduled annual screening mammogram Nl breast exam today  Encouraged monthly self exams   

## 2012-01-16 ENCOUNTER — Encounter: Payer: Self-pay | Admitting: *Deleted

## 2012-02-13 ENCOUNTER — Ambulatory Visit: Payer: Self-pay | Admitting: Family Medicine

## 2012-02-15 ENCOUNTER — Encounter: Payer: Self-pay | Admitting: Family Medicine

## 2012-02-21 ENCOUNTER — Ambulatory Visit: Payer: Self-pay | Admitting: Family Medicine

## 2012-02-25 ENCOUNTER — Encounter: Payer: Self-pay | Admitting: Family Medicine

## 2012-03-03 ENCOUNTER — Telehealth: Payer: Self-pay | Admitting: *Deleted

## 2012-03-03 DIAGNOSIS — R928 Other abnormal and inconclusive findings on diagnostic imaging of breast: Secondary | ICD-10-CM

## 2012-03-03 NOTE — Telephone Encounter (Signed)
Will do that order now

## 2012-03-03 NOTE — Telephone Encounter (Signed)
Patient called and said she tried to schedule her own 6 month repeat mammo and [facility] is requiring a referral from you. Please place in chart. Thanks!

## 2012-06-04 ENCOUNTER — Ambulatory Visit (INDEPENDENT_AMBULATORY_CARE_PROVIDER_SITE_OTHER): Payer: Medicare Other

## 2012-06-04 ENCOUNTER — Telehealth: Payer: Self-pay | Admitting: Family Medicine

## 2012-06-04 DIAGNOSIS — Z23 Encounter for immunization: Secondary | ICD-10-CM

## 2012-06-04 DIAGNOSIS — R922 Inconclusive mammogram: Secondary | ICD-10-CM

## 2012-06-04 NOTE — Telephone Encounter (Signed)
Message copied by Judy Pimple on Wed Jun 04, 2012 10:16 PM ------      Message from: Carlton Adam      Created: Wed Jun 04, 2012 10:23 AM       Dr Milinda Antis, Please put an order in Epic for a Uni Left Breast Diagnostic MMG for [facility]-external. DX is Density. I have already got her scheduled for January 2014. Thanks, Shirlee Limerick

## 2012-06-26 ENCOUNTER — Other Ambulatory Visit: Payer: Self-pay | Admitting: Family Medicine

## 2012-06-26 NOTE — Telephone Encounter (Signed)
Rx called in as prescribed 

## 2012-06-26 NOTE — Telephone Encounter (Signed)
Ok to refill 

## 2012-06-26 NOTE — Telephone Encounter (Signed)
Px written for call in   

## 2012-08-18 ENCOUNTER — Ambulatory Visit: Payer: Self-pay | Admitting: Orthopedic Surgery

## 2012-09-05 ENCOUNTER — Ambulatory Visit: Payer: Self-pay | Admitting: Orthopedic Surgery

## 2012-09-18 ENCOUNTER — Ambulatory Visit: Payer: Self-pay | Admitting: Family Medicine

## 2012-09-23 ENCOUNTER — Ambulatory Visit: Payer: Self-pay | Admitting: Family Medicine

## 2012-09-25 ENCOUNTER — Encounter: Payer: Self-pay | Admitting: Family Medicine

## 2012-09-26 ENCOUNTER — Telehealth: Payer: Self-pay | Admitting: Family Medicine

## 2012-09-26 NOTE — Telephone Encounter (Signed)
I spoke to her and answered as many questions as I could but could not answer all of them re: why they are doing a bilat mammogram now after her prev study I enc her to call the [place] [facility] and schedule a consultation with one of their radiologists to explain things If that does not work out- she would like a ref to the Breast center in [place]

## 2012-09-26 NOTE — Telephone Encounter (Signed)
Pt has several questions concerning breast cancer and some upcoming procedures. She would like a call back from you. She says she's talked to triage but still has questions. Are you able to call her? Thank you.

## 2012-10-02 ENCOUNTER — Encounter: Payer: Self-pay | Admitting: Family Medicine

## 2012-12-10 ENCOUNTER — Other Ambulatory Visit: Payer: Self-pay | Admitting: *Deleted

## 2012-12-10 MED ORDER — ALPRAZOLAM ER 0.5 MG PO TB24: ORAL_TABLET | ORAL | Status: DC

## 2012-12-10 MED ORDER — AMLODIPINE BESYLATE 5 MG PO TABS: ORAL_TABLET | ORAL | Status: DC

## 2012-12-10 MED ORDER — ESTROGENS CONJUGATED 0.3 MG PO TABS: ORAL_TABLET | ORAL | Status: DC

## 2012-12-10 MED ORDER — RAMIPRIL 10 MG PO CAPS: 10.0000 mg | ORAL_CAPSULE | Freq: Every day | ORAL | Status: DC

## 2012-12-10 NOTE — Telephone Encounter (Signed)
Received fax refill request, ok to refill  

## 2012-12-10 NOTE — Telephone Encounter (Signed)
Px written for call in   

## 2012-12-10 NOTE — Telephone Encounter (Signed)
Rx called in as prescribed 

## 2013-01-14 ENCOUNTER — Encounter: Payer: Self-pay | Admitting: Family Medicine

## 2013-01-14 ENCOUNTER — Ambulatory Visit (INDEPENDENT_AMBULATORY_CARE_PROVIDER_SITE_OTHER): Payer: Medicare Other | Admitting: Family Medicine

## 2013-01-14 VITALS — BP 128/70 | HR 74 | Temp 98.3°F | Ht 64.0 in | Wt 190.5 lb

## 2013-01-14 DIAGNOSIS — E785 Hyperlipidemia, unspecified: Secondary | ICD-10-CM

## 2013-01-14 DIAGNOSIS — R7309 Other abnormal glucose: Secondary | ICD-10-CM

## 2013-01-14 DIAGNOSIS — E669 Obesity, unspecified: Secondary | ICD-10-CM

## 2013-01-14 DIAGNOSIS — R6889 Other general symptoms and signs: Secondary | ICD-10-CM

## 2013-01-14 DIAGNOSIS — Z0000 Encounter for general adult medical examination without abnormal findings: Secondary | ICD-10-CM

## 2013-01-14 DIAGNOSIS — R739 Hyperglycemia, unspecified: Secondary | ICD-10-CM

## 2013-01-14 DIAGNOSIS — R7989 Other specified abnormal findings of blood chemistry: Secondary | ICD-10-CM

## 2013-01-14 DIAGNOSIS — I10 Essential (primary) hypertension: Secondary | ICD-10-CM

## 2013-01-14 LAB — COMPREHENSIVE METABOLIC PANEL
Alkaline Phosphatase: 67 U/L (ref 39–117)
BUN: 19 mg/dL (ref 6–23)
Creatinine, Ser: 0.8 mg/dL (ref 0.4–1.2)
GFR: 72.95 mL/min (ref >60.00)
Glucose, Bld: 103 mg/dL — ABNORMAL HIGH (ref 70–99)
Total Bilirubin: 0.9 mg/dL (ref 0.3–1.2)

## 2013-01-14 LAB — CBC WITH DIFFERENTIAL/PLATELET
Basophils Relative: 0.4 % (ref 0.0–3.0)
Eosinophils Relative: 2.2 % (ref 0.0–5.0)
HCT: 46.5 % — ABNORMAL HIGH (ref 36.0–46.0)
Hemoglobin: 16 g/dL — ABNORMAL HIGH (ref 12.0–15.0)
Lymphs Abs: 3.5 10*3/uL (ref 0.7–4.0)
MCV: 85.8 fl (ref 78.0–100.0)
Monocytes Absolute: 0.9 10*3/uL (ref 0.1–1.0)
Neutro Abs: 4.9 10*3/uL (ref 1.4–7.7)
Platelets: 309 10*3/uL (ref 150.0–400.0)
RBC: 5.42 Mil/uL — ABNORMAL HIGH (ref 3.87–5.11)
WBC: 9.6 10*3/uL (ref 4.5–10.5)

## 2013-01-14 LAB — TSH: TSH: 1 u[IU]/mL (ref 0.35–5.50)

## 2013-01-14 MED ORDER — RAMIPRIL 10 MG PO CAPS: 10.0000 mg | ORAL_CAPSULE | Freq: Every day | ORAL | Status: DC

## 2013-01-14 MED ORDER — TRIAMTERENE-HCTZ 37.5-25 MG PO TABS: 1.0000 | ORAL_TABLET | Freq: Every day | ORAL | Status: DC

## 2013-01-14 NOTE — Progress Notes (Signed)
Subjective:    Patient ID: [name], female    DOB: 1946-08-31, 67 y.o.   MRN: [id number]  HPI I have personally reviewed the Medicare Annual Wellness questionnaire and have noted 1. The patient's medical and social history 2. Their use of alcohol, tobacco or illicit drugs 3. Their current medications and supplements 4. The patient's functional ability including ADL's, fall risks, home safety risks and hearing or visual             impairment. 5. Diet and physical activities 6. Evidence for depression or mood disorders  The patients weight, height, BMI have been recorded in the chart and visual acuity is per eye clinic.  I have made referrals, counseling and provided education to the patient based review of the above and I have provided the pt with a written personalized care plan for preventive services.  Bad back problems  Seeing specialist - had MRI and injections - having a very hard time , and does PT exercises  Surgery is not recommended  Bone spurs L4 - L5 Cannot do any exercise  Tried water exercise but dislikes her Y because the pool is really crowded   See scanned forms.  Routine anticipatory guidance given to patient.  See health maintenance. Flu- 9/13 vaccine  Shingles had vaccine in 9/10 PNA - cannot give it to her due to severe rxn in 2004 to the last one  Tetanus shot was 1/10 Colonosopy was 1/13 -- 5 year f/u due to family hx (was clear) Breast cancer screening- has had several f/u mammo , last one was 1/14 (density)- next is due august 2014- she is aware , watching a spot  Has had a hysterectomy partial- that was for fibroids and bleeding , last pap 07  No gyn problems at all , no hx of abn paps  Advance directive has a living will and power of attorney (husband)  Cognitive function addressed- see scanned forms- and if abnormal then additional documentation follows.  No issues at all with memory or confusion/ cognition  Mood -normal -not depressed or lack of  motivation   Falls -none at all , balance is ok   PMH and SH reviewed  Meds, vitals, and allergies reviewed.   ROS: See HPI.  Otherwise negative.   Needs labs today- a1c and labs for htn  She declines chol check since she does not tolerate any kind of medicine at all  Diet is good  No exercise recently - tries recumbant bike -gives her back pain    bp is stable today  No cp or palpitations or headaches or edema  No side effects to medicines  BP Readings from Last 3 Encounters:  01/14/13 128/70  01/15/12 122/72  12/26/10 128/76      dexa nl times 2 (06) Takes her ca and vitamin D  No broken bones   Patient Active Problem List   Diagnosis Date Noted  . Encounter for Medicare annual wellness exam 01/14/2013  . Breast density 06/04/2012  . Abnormal mammogram 03/03/2012  . Obesity 01/15/2012  . Other screening mammogram 12/26/2010  . Stress reaction, emotional 12/26/2010  . Abnormal TSH 12/26/2010  . Routine general medical examination at a health care facility 12/14/2010  . MENOPAUSAL SYNDROME 10/04/2008  . Hyperglycemia 12/02/2007  . TREMOR 12/02/2007  . DEPRESSION 08/04/2007  . HYPERLIPIDEMIA 01/28/2007  . ANXIETY 01/28/2007  . HYPERTENSION 01/28/2007  . GERD 01/28/2007  . URINARY INCONTINENCE 01/28/2007  . COLONIC POLYPS, HX OF 01/28/2007  .  HERPES ZOSTER 01/20/2007  . DYSPHONIA 01/20/2007  . COUGH, CHRONIC 01/20/2007  . ALLERGY 01/20/2007   Past Medical History  Diagnosis Date  . Anxiety   . Panic disorder   . Depression   . Voice disorder   . Hx of colonic polyp   . GERD (gastroesophageal reflux disease)   . Hyperlipemia     Very high chol intol of statins-does not want to check it  . Hypertension   . Hyperglycemia   . Tremor     from anx  . Urinary incontinence     mixed  . Allergic rhinitis     gets allergy shot from Dr. Chestine Spore  . History of shingles     at age 67- mild   Past Surgical History  Procedure Laterality Date  . Partial  hysterectomy      with fibroid  . Nasal sinus surgery    . Bladder suspension    . Rectocele repair     History  Substance Use Topics  . Smoking status: Never Smoker   . Smokeless tobacco: Not on file  . Alcohol Use: No   Family History  Problem Relation Age of Onset  . Diabetes Mother   . Hyperlipidemia Mother   . Hypertension Mother   . Osteoarthritis Mother   . Cancer Father     Colon   Allergies  Allergen Reactions  . Atorvastatin     REACTION: shoulder pain  . Ezetimibe     REACTION: ?  . Lisinopril     REACTION: muscle pain  . Penicillins     REACTION: reaction not known  . Pneumococcal Vaccine Polyvalent     REACTION: severe local reaction  . Rosuvastatin     REACTION: leg pain  . Simvastatin     REACTION: muscle pain   Current Outpatient Prescriptions on File Prior to Visit  Medication Sig Dispense Refill  . ALPRAZolam (XANAX XR) 0.5 MG 24 hr tablet TAKE 1 TABLET EVERY DAY  90 tablet  1  . amLODipine (NORVASC) 5 MG tablet TAKE 1 TABLET EVERY DAY  90 tablet  0  . Azelastine HCl (ASTEPRO) 0.15 % SOLN 2 sprays by Nasal route 2 (two) times daily as needed.  30 mL  11  . Calcium Carbonate-Vitamin D (CALCIUM 600+D PO) Take by mouth 2 (two) times daily.        . cetirizine (ZYRTEC) 10 MG tablet Take 10 mg by mouth daily.        Marland Kitchen estrogens, conjugated, (PREMARIN) 0.3 MG tablet TAKE 1 TABLET EVERY DAY  90 tablet  0  . hyoscyamine (LEVSIN SL) 0.125 MG SL tablet Use one under tongue up to every 4-6 hours as needed for cramps       . Multiple Vitamin (MULTIVITAMIN) tablet Take 1 tablet by mouth daily.        . Omega-3 Fatty Acids (FISH OIL) 1200 MG CAPS Take by mouth daily.         No current facility-administered medications on file prior to visit.    Review of Systems Review of Systems  Constitutional: Negative for fever, appetite change, fatigue and unexpected weight change.  Eyes: Negative for pain and visual disturbance.  Respiratory: Negative for cough and  shortness of breath.   Cardiovascular: Negative for cp or palpitations    Gastrointestinal: Negative for nausea, diarrhea and constipation.  Genitourinary: Negative for urgency and frequency.  Skin: Negative for pallor or rash   MSK pos for acute on chronic  back pain  Neurological: Negative for weakness, light-headedness, numbness and headaches.  Hematological: Negative for adenopathy. Does not bruise/bleed easily.  Psychiatric/Behavioral: Negative for dysphoric mood. The patient is not nervous/anxious.         Objective:   Physical Exam  Constitutional: She appears well-developed and well-nourished. No distress.  obese and well appearing   HENT:  Head: Normocephalic and atraumatic.  Right Ear: External ear normal.  Left Ear: External ear normal.  Mouth/Throat: Oropharynx is clear and moist.  Eyes: Conjunctivae and EOM are normal. Pupils are equal, round, and reactive to light. Right eye exhibits no discharge. Left eye exhibits no discharge. No scleral icterus.  Neck: Normal range of motion. Neck supple. No JVD present. Carotid bruit is not present. No thyromegaly present.  Cardiovascular: Normal rate, regular rhythm, normal heart sounds and intact distal pulses.  Exam reveals no gallop.   Pulmonary/Chest: Effort normal and breath sounds normal. No respiratory distress. She has no wheezes.  Abdominal: Soft. Bowel sounds are normal. She exhibits no distension, no abdominal bruit and no mass. There is no tenderness.  Genitourinary: No breast swelling, tenderness, discharge or bleeding.  Breast exam: No mass, nodules, thickening, tenderness, bulging, retraction, inflamation, nipple discharge or skin changes noted.  No axillary or clavicular LA.  Chaperoned exam.    Musculoskeletal: She exhibits no edema and no tenderness.  Poor rom of LS and TS  Lymphadenopathy:    She has no cervical adenopathy.  Neurological: She is alert. She has normal reflexes. No cranial nerve deficit. She exhibits  normal muscle tone. Coordination normal.  Skin: Skin is warm and dry. No rash noted. No erythema. No pallor.  Psychiatric: She has a normal mood and affect.          Assessment & Plan:

## 2013-01-14 NOTE — Patient Instructions (Addendum)
Labs today  Try going off the premarin and see how you do  Keep thinking about what kind of exercise you could do - ? Different pool/ ? Bike 2 days per week/ ? Video for chair exercise  Eat a healthy diet

## 2013-01-15 ENCOUNTER — Encounter: Payer: Self-pay | Admitting: *Deleted

## 2013-01-15 NOTE — Assessment & Plan Note (Signed)
Reviewed health habits including diet and exercise and skin cancer prevention Also reviewed health mt list, fam hx and immunizations  See HPI

## 2013-01-15 NOTE — Assessment & Plan Note (Signed)
Discussed how this problem influences overall health and the risks it imposes  Reviewed plan for weight loss with lower calorie diet (via better food choices and also portion control or program like weight watchers) and exercise building up to or more than 30 minutes 5 days per week including some aerobic activity   Disc what exercise she could poss tolerate with back pain - see AVS

## 2013-01-15 NOTE — Assessment & Plan Note (Signed)
Very high chol despite good diet  Pt intol of all meds an even failed lipid clinic  Pt no longer wants to monitor this

## 2013-01-15 NOTE — Assessment & Plan Note (Signed)
Lab today Enc low impact exercise - as much as she can tolerate Disc need for wt loss and low glycemic diet

## 2013-01-15 NOTE — Assessment & Plan Note (Signed)
This normalized Re check today to assure stability

## 2013-01-15 NOTE — Assessment & Plan Note (Signed)
bp in fair control at this time  No changes needed  Disc lifstyle change with low sodium diet and exercise   Labs today 

## 2013-02-26 ENCOUNTER — Encounter: Payer: Self-pay | Admitting: Family Medicine

## 2013-02-26 ENCOUNTER — Ambulatory Visit (INDEPENDENT_AMBULATORY_CARE_PROVIDER_SITE_OTHER): Payer: Medicare Other | Admitting: Family Medicine

## 2013-02-26 ENCOUNTER — Telehealth: Payer: Self-pay | Admitting: Family Medicine

## 2013-02-26 VITALS — BP 130/78 | HR 84 | Temp 98.2°F | Wt 186.0 lb

## 2013-02-26 DIAGNOSIS — R079 Chest pain, unspecified: Secondary | ICD-10-CM

## 2013-02-26 MED ORDER — SUCRALFATE 1 GM/10ML PO SUSP: 1.0000 g | Freq: Four times a day (QID) | ORAL | Status: DC

## 2013-02-26 NOTE — Patient Instructions (Signed)
I think this discomfort is stomach related. Stop omeprazole - take nexium 40mg  daily for next 2 weeks.  Also take carafate solution 3-4 times daily (if tolerated) to help coat stomach lining. If not improving as expected, return to see Dr. Milinda Antis. If any worsening, please see [place] again or seek urgent care.

## 2013-02-26 NOTE — Assessment & Plan Note (Addendum)
Cardiac risk factors include HTN, obesity and uncontrolled HLD, however description of pain sounds noncardiac in nature. Anticipate more related to worsening GERD after steroid taper or possible steroid induced gastritis. On omeprazole 40mg  daily. Check EKG today, provided with GI cocktail which significantly improved sxs. Will increase med to nexium for next 2 weeks (samples provided) and prescribe carafate soln to take for next 2 weeks. RTC if sxs persist or seek urgent care if sxs worsen. Pt agrees with plan. ?concomittant costochondritis - will monitor for now.  EKG today - NSR rate 80s, normal axis, intervals, no acute ST/T changes or hypertrophy.  ?[place]

## 2013-02-26 NOTE — Telephone Encounter (Signed)
Will see then. 

## 2013-02-26 NOTE — Telephone Encounter (Signed)
Patient Information:  Caller Name: Ardyth  Phone: [phone]  Patient: [name], [name]  Gender: Female  DOB: 08/03/1946  Age: 67 Years  PCP: Roxy Manns [facility])  Office Follow Up:  Does the office need to follow up with this patient?: No  Instructions For The Office: N/A   Symptoms  Reason For Call & Symptoms: Patient calling, continues to have sternal chest pain that started when she was given Prednisone taper dose.  She was advised to take Prilosec and antacids and has done this for a week with no improvement.  Her ENT had prescribed this and told her if no improvement to call her PCP to be seen.   Has this discomfort all the time with worsening sx when resting in bed.  The pain radiates into her abdominal area.  She is sleeping with 2 extra pillows.  Has nausea but no vomiting.  When she takes a deep breath she has the pain between her breast.  Reviewed Health History In EMR: Yes  Reviewed Medications In EMR: Yes  Reviewed Allergies In EMR: Yes  Reviewed Surgeries / Procedures: Yes  Date of Onset of Symptoms: 02/09/2013  Treatments Tried: Prilosec, antacids  Treatments Tried Worked: No  Guideline(s) Used:  Chest Pain  Disposition Per Guideline:   See Today in Office  Reason For Disposition Reached:   Intermittent chest pains persist > 3 days  Advice Given:  N/A  Patient Will Follow Care Advice:  YES  Appointment Scheduled:  02/26/2013 10:15:00 Appointment Scheduled Provider:  Eustaquio Boyden (Family Practice)

## 2013-02-26 NOTE — Progress Notes (Signed)
  Subjective:    Patient ID: [name], female    DOB: 11-13-1945, 67 y.o.   MRN: [id number]  HPI CC: chest/abd pain  Endorsing sharp chest pain associated with burning pain substernally and epigastrically with deep breaths over the last week, but pain persistent regardless even at rest.  Worse with laying down.  No alleviation with leaning forward.  Nothing so far alleviated sxs.  Pain not related to exertion but she hasn't been active.  No pressure/tightness.  Not reproducible.  Some nausea with this.  Pain has now spread to abdomen - described as burning pain.  Pain not related to foods - eating more bland foods. On omeprazole 20mg  bid.  Changed by ENT to omeprazole 40mg  daily for 30d.  Also using acid reducer. Was placed on steroid taper for recent ear infection by ENT, chest pain started after she started steroids. Has been on steroids in past, did not respond like this.  No cardiac history other than hypertension and hyperlipidemia.  No family history of CAD Lab Results  Component Value Date   CHOL 325* 01/15/2012   HDL 53.40 01/15/2012   LDLDIRECT 242.8 01/15/2012   TRIG 131.0 01/15/2012   CHOLHDL 6 01/15/2012    Past Medical History  Diagnosis Date  . Anxiety   . Panic disorder   . Depression   . Voice disorder   . Hx of colonic polyp   . GERD (gastroesophageal reflux disease)   . Hyperlipemia     Very high chol intol of statins-does not want to check it  . Hypertension   . Hyperglycemia   . Tremor     from anx  . Urinary incontinence     mixed  . Allergic rhinitis     gets allergy shot from Dr. Chestine Spore  . History of shingles     at age 61- mild    Family History  Problem Relation Age of Onset  . Diabetes Mother   . Hyperlipidemia Mother   . Hypertension Mother   . Osteoarthritis Mother   . Cancer Father     Colon    Review of Systems Per HPI    Objective:   Physical Exam  Nursing note and vitals reviewed. Constitutional: She appears well-developed and  well-nourished. No distress.  HENT:  Head: Normocephalic and atraumatic.  Mouth/Throat: Oropharynx is clear and moist. No oropharyngeal exudate.  Eyes: Conjunctivae are normal. Pupils are equal, round, and reactive to light.  Neck: Normal range of motion. Neck supple.  Cardiovascular: Normal rate, regular rhythm, normal heart sounds and intact distal pulses.   No murmur heard. Pulmonary/Chest: Effort normal and breath sounds normal. No respiratory distress. She has no wheezes. She has no rales. She exhibits tenderness (R 3rd/4th CC junction).  Abdominal: Soft. Normal appearance and bowel sounds are normal. She exhibits no distension and no mass. There is no hepatosplenomegaly. There is tenderness (mild) in the right upper quadrant, right lower quadrant and epigastric area. There is no rigidity, no rebound, no guarding and negative Murphy's sign.  Musculoskeletal: She exhibits no edema.  Lymphadenopathy:    She has no cervical adenopathy.  Skin: Skin is warm and dry. No rash noted.  Psychiatric: She has a normal mood and affect.       Assessment & Plan:

## 2013-02-26 NOTE — Telephone Encounter (Signed)
Jacqulyn/Patient Phone [phone] called regarding abdominal pain.  Declined 911.  No answer at time of RN call back.  Left message to call back if assistance still required.

## 2013-03-19 ENCOUNTER — Telehealth: Payer: Self-pay | Admitting: Family Medicine

## 2013-03-19 DIAGNOSIS — R9230 Dense breasts, unspecified: Secondary | ICD-10-CM

## 2013-03-19 DIAGNOSIS — R922 Inconclusive mammogram: Secondary | ICD-10-CM

## 2013-03-19 NOTE — Telephone Encounter (Signed)
Message copied by Judy Pimple on Thu Mar 19, 2013  5:02 PM ------      Message from: Buena Irish      Created: Wed Mar 18, 2013  2:47 PM      Regarding: Order Needed       [facility] called and [name] needs an order for an annual Diagnostic Bilateral MM for L breast Density.  Thank you.            Ricki Miller       ------

## 2013-03-23 ENCOUNTER — Other Ambulatory Visit: Payer: Self-pay | Admitting: Family Medicine

## 2013-03-23 NOTE — Telephone Encounter (Signed)
Looks like she got 90 xanax xr with 1 refill 3 mo ago and that should last her 6 mo, correct? Please clarify

## 2013-03-23 NOTE — Telephone Encounter (Signed)
Fax refill request, please advise  

## 2013-03-24 MED ORDER — AMLODIPINE BESYLATE 5 MG PO TABS: ORAL_TABLET | ORAL | Status: DC

## 2013-03-24 NOTE — Telephone Encounter (Signed)
I called pharmacy and pt picked up her last refill on her alprazolam xr  yesterday and it was for a 3 month supply, pt is on auto refill and the pharmacy automatically requested this refill, they forgot to put a note on the Rx saying it was for the next 3 months, I advise that it's to early to fill and that she would have to request it closer to due date, I declined this refill

## 2013-04-09 ENCOUNTER — Other Ambulatory Visit: Payer: Self-pay | Admitting: *Deleted

## 2013-04-09 MED ORDER — OMEPRAZOLE 20 MG PO CPDR: 20.0000 mg | DELAYED_RELEASE_CAPSULE | Freq: Two times a day (BID) | ORAL | Status: DC

## 2013-04-15 ENCOUNTER — Telehealth: Payer: Self-pay | Admitting: Family Medicine

## 2013-04-15 ENCOUNTER — Ambulatory Visit: Payer: Self-pay | Admitting: Family Medicine

## 2013-04-15 DIAGNOSIS — R9230 Dense breasts, unspecified: Secondary | ICD-10-CM

## 2013-04-15 DIAGNOSIS — R922 Inconclusive mammogram: Secondary | ICD-10-CM

## 2013-04-15 NOTE — Telephone Encounter (Signed)
rec a call from [facility] that pt has L breast density and needs order for her 6 mo f/u ultrasound  She is there getting the test now for  Putting in order as requested

## 2013-04-17 ENCOUNTER — Encounter: Payer: Self-pay | Admitting: Family Medicine

## 2013-04-22 ENCOUNTER — Encounter: Payer: Self-pay | Admitting: *Deleted

## 2013-04-22 ENCOUNTER — Telehealth: Payer: Self-pay | Admitting: *Deleted

## 2013-04-22 ENCOUNTER — Encounter: Payer: Self-pay | Admitting: Family Medicine

## 2013-04-22 NOTE — Telephone Encounter (Signed)
Pt notified of mammogram/US results, pt said that she did look on Mychart but will double check to make sure the results are there, if not she will let us know

## 2013-04-22 NOTE — Telephone Encounter (Signed)
Pt had a mammogram and [place] of left breast, we haven't called her and let her know the results. Pt wanted to know the results of her mammogram/US, please advise (both are scanned in EPIC)

## 2013-04-22 NOTE — Telephone Encounter (Signed)
Results are stable and consistent with a benign LN - I think I released this on my chart previously

## 2013-06-02 ENCOUNTER — Ambulatory Visit (INDEPENDENT_AMBULATORY_CARE_PROVIDER_SITE_OTHER): Payer: Medicare Other

## 2013-06-02 DIAGNOSIS — Z23 Encounter for immunization: Secondary | ICD-10-CM

## 2013-06-18 ENCOUNTER — Other Ambulatory Visit: Payer: Self-pay | Admitting: *Deleted

## 2013-06-18 MED ORDER — AMLODIPINE BESYLATE 5 MG PO TABS: ORAL_TABLET | ORAL | Status: DC

## 2013-06-18 NOTE — Telephone Encounter (Signed)
Fax refill request, please advise  

## 2013-06-19 MED ORDER — ALPRAZOLAM ER 0.5 MG PO TB24: ORAL_TABLET | ORAL | Status: DC

## 2013-06-19 NOTE — Telephone Encounter (Signed)
Rx called in as prescribed 

## 2013-06-19 NOTE — Telephone Encounter (Signed)
Px written for call in   

## 2013-08-26 ENCOUNTER — Other Ambulatory Visit: Payer: Self-pay | Admitting: *Deleted

## 2013-08-26 MED ORDER — OMEPRAZOLE 20 MG PO CPDR: 20.0000 mg | DELAYED_RELEASE_CAPSULE | Freq: Two times a day (BID) | ORAL | Status: DC

## 2013-09-15 ENCOUNTER — Other Ambulatory Visit: Payer: Self-pay | Admitting: *Deleted

## 2013-09-15 MED ORDER — ALPRAZOLAM ER 0.5 MG PO TB24: ORAL_TABLET | ORAL | Status: DC

## 2013-09-15 NOTE — Telephone Encounter (Signed)
Px written for call in   

## 2013-09-15 NOTE — Telephone Encounter (Signed)
Last filled 06/19/13

## 2013-09-16 ENCOUNTER — Ambulatory Visit: Payer: Self-pay | Admitting: Orthopedic Surgery

## 2013-09-16 ENCOUNTER — Telehealth: Payer: Self-pay | Admitting: *Deleted

## 2013-09-16 NOTE — Telephone Encounter (Signed)
Received prior auth request for xanax xr.  Auth paperwork obtained and placed in your inbox.

## 2013-09-16 NOTE — Telephone Encounter (Signed)
Rx called in as prescribed 

## 2013-09-18 NOTE — Telephone Encounter (Signed)
done

## 2013-09-21 NOTE — Telephone Encounter (Signed)
Approval form received by insurance company. Will send to provider for signature, then to be scanned into patients medical record. Pharmacy notified via fax.  

## 2013-12-14 ENCOUNTER — Ambulatory Visit (INDEPENDENT_AMBULATORY_CARE_PROVIDER_SITE_OTHER): Payer: Medicare Other | Admitting: Family Medicine

## 2013-12-14 ENCOUNTER — Encounter: Payer: Self-pay | Admitting: Family Medicine

## 2013-12-14 VITALS — BP 130/82 | HR 80 | Temp 97.9°F | Ht 64.0 in | Wt 187.8 lb

## 2013-12-14 DIAGNOSIS — F411 Generalized anxiety disorder: Secondary | ICD-10-CM

## 2013-12-14 MED ORDER — SERTRALINE HCL 50 MG PO TABS: 50.0000 mg | ORAL_TABLET | Freq: Every day | ORAL | Status: DC

## 2013-12-14 NOTE — Progress Notes (Signed)
Subjective:    Patient ID: [name], female    DOB: 1946/06/27, 68 y.o.   MRN: [id number]  HPI Here with worsened anxiety  "the least little thing puts [place] over the edge"  She "needs to do something"  A lot of things have happened in her life - stress  Not being abused  Not suicidal or homicidal   Stressors: - started with care for her MIL- in [facility] , with colon cancer - she passed away Jun 30, 2013 She had to be there a lot  Finally her husband had to go on his own - due to her own anxiety  Then she had a bad knee injury in Dec- followed by a calf muscle tear  Now has bad arthritis in it  Then she had to put her dog to sleep-brain tumors Then she had to care for her sister who had back surgery  Has not been able to exercise due to all of her orthopedic problems   Now she shakes all over all the time Nervous Cannot concentrate-even to read   In the past she was on 1 mg of xr xanax and also zoloft  buspar made her sick paxil -made her a "zombie"-so did prozac lexapro " was horrible"  She did counseling in the past and did not find it helpful  No hx of trauma or PTSD   Patient Active Problem List   Diagnosis Date Noted  . Chest pain 02/26/2013  . Encounter for Medicare annual wellness exam 01/14/2013  . Breast density 06/04/2012  . Abnormal mammogram 03/03/2012  . Obesity 01/15/2012  . Other screening mammogram 12/26/2010  . Stress reaction, emotional 12/26/2010  . Abnormal TSH 12/26/2010  . Routine general medical examination at a health care facility 12/14/2010  . MENOPAUSAL SYNDROME 10/04/2008  . Hyperglycemia 12/02/2007  . TREMOR 12/02/2007  . DEPRESSION 08/04/2007  . HYPERLIPIDEMIA 01/28/2007  . ANXIETY 01/28/2007  . HYPERTENSION 01/28/2007  . GERD 01/28/2007  . URINARY INCONTINENCE 01/28/2007  . COLONIC POLYPS, HX OF 01/28/2007  . HERPES ZOSTER 01/20/2007  . DYSPHONIA 01/20/2007  . COUGH, CHRONIC 01/20/2007  . ALLERGY 01/20/2007   Past Medical History    Diagnosis Date  . Anxiety   . Panic disorder   . Depression   . Voice disorder   . Hx of colonic polyp   . GERD (gastroesophageal reflux disease)   . Hyperlipemia     Very high chol intol of statins-does not want to check it  . Hypertension   . Hyperglycemia   . Tremor     from anx  . Urinary incontinence     mixed  . Allergic rhinitis     gets allergy shot from Dr. Carlis Abbott  . History of shingles     at age 30- mild   Past Surgical History  Procedure Laterality Date  . Partial hysterectomy      with fibroid  . Nasal sinus surgery    . Bladder suspension    . Rectocele repair     History  Substance Use Topics  . Smoking status: Never Smoker   . Smokeless tobacco: Not on file  . Alcohol Use: No   Family History  Problem Relation Age of Onset  . Diabetes Mother   . Hyperlipidemia Mother   . Hypertension Mother   . Osteoarthritis Mother   . Cancer Father     Colon   Allergies  Allergen Reactions  . Atorvastatin     REACTION: shoulder pain  .  Ezetimibe     REACTION: ?  . Lisinopril     REACTION: muscle pain  . Penicillins     REACTION: reaction not known  . Pneumococcal Vaccine Polyvalent     REACTION: severe local reaction  . Rosuvastatin     REACTION: leg pain  . Simvastatin     REACTION: muscle pain   Current Outpatient Prescriptions on File Prior to Visit  Medication Sig Dispense Refill  . ALPRAZolam (XANAX XR) 0.5 MG 24 hr tablet TAKE 1 TABLET EVERY DAY  90 tablet  1  . amLODipine (NORVASC) 5 MG tablet TAKE 1 TABLET EVERY DAY  90 tablet  1  . Azelastine HCl (ASTEPRO) 0.15 % SOLN 2 sprays by Nasal route 2 (two) times daily as needed.  30 mL  11  . Calcium Carbonate-Vitamin D (CALCIUM 600+D PO) Take by mouth 2 (two) times daily.        . cetirizine (ZYRTEC) 10 MG tablet Take 10 mg by mouth daily.        . Multiple Vitamin (MULTIVITAMIN) tablet Take 1 tablet by mouth daily.        . Omega-3 Fatty Acids (FISH OIL) 1200 MG CAPS Take by mouth daily.         . ramipril (ALTACE) 10 MG capsule Take 1 capsule (10 mg total) by mouth daily.  90 capsule  3  . sucralfate (CARAFATE) 1 GM/10ML suspension Take 10 mLs (1 g total) by mouth 4 (four) times daily. For 2 wks  420 mL  0  . triamterene-hydrochlorothiazide (MAXZIDE-25) 37.5-25 MG per tablet Take 1 tablet by mouth daily. Take one half tablet by mouth daily  90 tablet  3   No current facility-administered medications on file prior to visit.    She had px of the 1 mg xanax xr - has some at home   Is interested in psychiatry referral  Patient Active Problem List   Diagnosis Date Noted  . Chest pain 02/26/2013  . Encounter for Medicare annual wellness exam 01/14/2013  . Breast density 06/04/2012  . Abnormal mammogram 03/03/2012  . Obesity 01/15/2012  . Other screening mammogram 12/26/2010  . Stress reaction, emotional 12/26/2010  . Abnormal TSH 12/26/2010  . Routine general medical examination at a health care facility 12/14/2010  . MENOPAUSAL SYNDROME 10/04/2008  . Hyperglycemia 12/02/2007  . TREMOR 12/02/2007  . DEPRESSION 08/04/2007  . HYPERLIPIDEMIA 01/28/2007  . ANXIETY 01/28/2007  . HYPERTENSION 01/28/2007  . GERD 01/28/2007  . URINARY INCONTINENCE 01/28/2007  . COLONIC POLYPS, HX OF 01/28/2007  . HERPES ZOSTER 01/20/2007  . DYSPHONIA 01/20/2007  . COUGH, CHRONIC 01/20/2007  . ALLERGY 01/20/2007   Past Medical History  Diagnosis Date  . Anxiety   . Panic disorder   . Depression   . Voice disorder   . Hx of colonic polyp   . GERD (gastroesophageal reflux disease)   . Hyperlipemia     Very high chol intol of statins-does not want to check it  . Hypertension   . Hyperglycemia   . Tremor     from anx  . Urinary incontinence     mixed  . Allergic rhinitis     gets allergy shot from Dr. Carlis Abbott  . History of shingles     at age 29- mild   Past Surgical History  Procedure Laterality Date  . Partial hysterectomy      with fibroid  . Nasal sinus surgery    . Bladder  suspension    .  Rectocele repair     History  Substance Use Topics  . Smoking status: Never Smoker   . Smokeless tobacco: Not on file  . Alcohol Use: No   Family History  Problem Relation Age of Onset  . Diabetes Mother   . Hyperlipidemia Mother   . Hypertension Mother   . Osteoarthritis Mother   . Cancer Father     Colon   Allergies  Allergen Reactions  . Atorvastatin     REACTION: shoulder pain  . Ezetimibe     REACTION: ?  . Lisinopril     REACTION: muscle pain  . Penicillins     REACTION: reaction not known  . Pneumococcal Vaccine Polyvalent     REACTION: severe local reaction  . Rosuvastatin     REACTION: leg pain  . Simvastatin     REACTION: muscle pain   Current Outpatient Prescriptions on File Prior to Visit  Medication Sig Dispense Refill  . amLODipine (NORVASC) 5 MG tablet TAKE 1 TABLET EVERY DAY  90 tablet  1  . Azelastine HCl (ASTEPRO) 0.15 % SOLN 2 sprays by Nasal route 2 (two) times daily as needed.  30 mL  11  . Calcium Carbonate-Vitamin D (CALCIUM 600+D PO) Take by mouth 2 (two) times daily.        . cetirizine (ZYRTEC) 10 MG tablet Take 10 mg by mouth daily.        . Multiple Vitamin (MULTIVITAMIN) tablet Take 1 tablet by mouth daily.        . Omega-3 Fatty Acids (FISH OIL) 1200 MG CAPS Take by mouth daily.        . ramipril (ALTACE) 10 MG capsule Take 1 capsule (10 mg total) by mouth daily.  90 capsule  3  . sucralfate (CARAFATE) 1 GM/10ML suspension Take 10 mLs (1 g total) by mouth 4 (four) times daily. For 2 wks  420 mL  0  . triamterene-hydrochlorothiazide (MAXZIDE-25) 37.5-25 MG per tablet Take 1 tablet by mouth daily. Take one half tablet by mouth daily  90 tablet  3   No current facility-administered medications on file prior to visit.    Review of Systems Review of Systems  Constitutional: Negative for fever, appetite change, fatigue and unexpected weight change.  Eyes: Negative for pain and visual disturbance.  Respiratory: Negative for  cough and shortness of breath.   Cardiovascular: Negative for cp or palpitations    Gastrointestinal: Negative for nausea, diarrhea and constipation.  Genitourinary: Negative for urgency and frequency.  Skin: Negative for pallor or rash   Neurological: Negative for weakness, light-headedness, numbness and headaches.  Hematological: Negative for adenopathy. Does not bruise/bleed easily.  Psychiatric/Behavioral: pos for dysphoric mood and anxiety/ with stressors, neg for SI         Objective:   Physical Exam  Constitutional: She appears well-developed and well-nourished. No distress.  obese and well appearing    HENT:  Head: Normocephalic and atraumatic.  Mouth/Throat: Oropharynx is clear and moist.  Eyes: Conjunctivae and EOM are normal. Pupils are equal, round, and reactive to light. No scleral icterus.  Neck: Normal range of motion. Neck supple.  Cardiovascular: Normal rate and regular rhythm.   Pulmonary/Chest: Effort normal and breath sounds normal.  Musculoskeletal: She exhibits no edema.  Lymphadenopathy:    She has no cervical adenopathy.  Neurological: She is alert. She has normal reflexes. She displays tremor. No cranial nerve deficit. She exhibits normal muscle tone. Coordination normal.  Mild hand tremor today  Skin: Skin is warm and dry. No rash noted. No erythema. No pallor.  Psychiatric: Her speech is normal and behavior is normal. Thought content normal. Her mood appears anxious. Her affect is not blunt, not labile and not inappropriate. She exhibits a depressed mood.  Pt is tearful at times Voice quivers due to anxiety  Is attentive and open about disc stressors           Assessment & Plan:

## 2013-12-14 NOTE — Patient Instructions (Signed)
Get on your computer and google some programs for "chair exercise" - start doing that regularly  Stop up front for psychiatry referral  Take the xanax xr 1 mg daily - have the pharmacy call when due for a refill- use what you have  Start zoloft 1/2 pill daily for 1-2 weeks and then increase to one 50 mg pill daily  Update if not starting to improve in a week or if worsening   If symptoms worsen - or if you become suicidal please go to [facility] for behavior health attention immediately

## 2013-12-14 NOTE — Progress Notes (Signed)
Pre visit review using our clinic review tool, if applicable. No additional management support is needed unless otherwise documented below in the visit note. 

## 2013-12-14 NOTE — Assessment & Plan Note (Signed)
Pt has already transitioned herself back to xanax xr 1 mg from .5 because she had some left  Will continue this does - disc habit forming potential of that  Also begin zoloft 25 titrate to 50 mg as tolerated She has failed/not tolerated other meds incl buspar/paxil/prozac  And lexapro  Ref to psychiatry Reviewed stressors/ coping techniques/symptoms/ support sources/ tx options and side effects in detail today  If she develops side eff or worse depr or SI - inst to stop zoloft immed and seek attention at [facility]-she voices understanding   >25 minutes spent in face to face time with patient, >50% spent in counselling or coordination of care

## 2013-12-15 ENCOUNTER — Other Ambulatory Visit: Payer: Self-pay | Admitting: Family Medicine

## 2013-12-16 ENCOUNTER — Encounter: Payer: Self-pay | Admitting: Family Medicine

## 2014-01-13 ENCOUNTER — Telehealth: Payer: Self-pay | Admitting: Family Medicine

## 2014-01-13 ENCOUNTER — Other Ambulatory Visit (INDEPENDENT_AMBULATORY_CARE_PROVIDER_SITE_OTHER): Payer: Medicare Other

## 2014-01-13 DIAGNOSIS — I10 Essential (primary) hypertension: Secondary | ICD-10-CM

## 2014-01-13 DIAGNOSIS — R7989 Other specified abnormal findings of blood chemistry: Secondary | ICD-10-CM

## 2014-01-13 DIAGNOSIS — R739 Hyperglycemia, unspecified: Secondary | ICD-10-CM

## 2014-01-13 DIAGNOSIS — R946 Abnormal results of thyroid function studies: Secondary | ICD-10-CM

## 2014-01-13 DIAGNOSIS — E785 Hyperlipidemia, unspecified: Secondary | ICD-10-CM

## 2014-01-13 DIAGNOSIS — R7309 Other abnormal glucose: Secondary | ICD-10-CM

## 2014-01-13 LAB — CBC WITH DIFFERENTIAL/PLATELET
BASOS ABS: 0 10*3/uL (ref 0.0–0.1)
Basophils Relative: 0.4 % (ref 0.0–3.0)
Eosinophils Absolute: 0.1 10*3/uL (ref 0.0–0.7)
Eosinophils Relative: 1.6 % (ref 0.0–5.0)
HCT: 44.8 % (ref 36.0–46.0)
HEMOGLOBIN: 15 g/dL (ref 12.0–15.0)
LYMPHS PCT: 29.2 % (ref 12.0–46.0)
Lymphs Abs: 2.6 10*3/uL (ref 0.7–4.0)
MCHC: 33.4 g/dL (ref 30.0–36.0)
MCV: 88.5 fl (ref 78.0–100.0)
MONOS PCT: 9.9 % (ref 3.0–12.0)
Monocytes Absolute: 0.9 10*3/uL (ref 0.1–1.0)
Neutro Abs: 5.2 10*3/uL (ref 1.4–7.7)
Neutrophils Relative %: 58.9 % (ref 43.0–77.0)
PLATELETS: 329 10*3/uL (ref 150.0–400.0)
RBC: 5.06 Mil/uL (ref 3.87–5.11)
RDW: 13.1 % (ref 11.5–14.6)
WBC: 8.8 10*3/uL (ref 4.5–10.5)

## 2014-01-13 LAB — TSH: TSH: 3.68 u[IU]/mL (ref 0.35–5.50)

## 2014-01-13 LAB — COMPREHENSIVE METABOLIC PANEL
ALT: 29 U/L (ref 0–35)
AST: 22 U/L (ref 0–37)
Albumin: 4 g/dL (ref 3.5–5.2)
Alkaline Phosphatase: 80 U/L (ref 39–117)
BUN: 18 mg/dL (ref 6–23)
CALCIUM: 9.7 mg/dL (ref 8.4–10.5)
CHLORIDE: 98 meq/L (ref 96–112)
CO2: 28 meq/L (ref 19–32)
Creatinine, Ser: 0.9 mg/dL (ref 0.4–1.2)
GFR: 66.24 mL/min (ref >60.00)
Glucose, Bld: 116 mg/dL — ABNORMAL HIGH (ref 70–99)
Potassium: 4.7 mEq/L (ref 3.5–5.1)
SODIUM: 135 meq/L (ref 135–145)
TOTAL PROTEIN: 7 g/dL (ref 6.0–8.3)
Total Bilirubin: 0.7 mg/dL (ref 0.3–1.2)

## 2014-01-13 LAB — LIPID PANEL
Cholesterol: 328 mg/dL — ABNORMAL HIGH (ref 0–200)
HDL: 43.4 mg/dL (ref >39.00)
LDL CALC: 262 mg/dL — AB (ref 0–99)
Total CHOL/HDL Ratio: 8
Triglycerides: 112 mg/dL (ref 0.0–149.0)
VLDL: 22.4 mg/dL (ref 0.0–40.0)

## 2014-01-13 LAB — HEMOGLOBIN A1C: Hgb A1c MFr Bld: 6.1 % (ref 4.6–6.5)

## 2014-01-13 NOTE — Telephone Encounter (Signed)
Message copied by Abner Greenspan on Wed Jan 13, 2014  1:11 AM ------      Message from: Ellamae Sia      Created: Tue Jan 05, 2014 11:00 AM      Regarding: Lab orders for Wednesday, 4.29.15       Patient is scheduled for CPX labs, please order future labs, Thanks , Terri       ------

## 2014-01-13 NOTE — Telephone Encounter (Signed)
Relevant patient education assigned to patient using Emmi. ° °

## 2014-01-19 ENCOUNTER — Encounter: Payer: Self-pay | Admitting: Family Medicine

## 2014-01-19 ENCOUNTER — Ambulatory Visit (INDEPENDENT_AMBULATORY_CARE_PROVIDER_SITE_OTHER): Payer: Medicare Other | Admitting: Family Medicine

## 2014-01-19 VITALS — BP 128/82 | HR 77 | Temp 98.3°F | Ht 64.5 in | Wt 184.5 lb

## 2014-01-19 DIAGNOSIS — Z0000 Encounter for general adult medical examination without abnormal findings: Secondary | ICD-10-CM

## 2014-01-19 DIAGNOSIS — F411 Generalized anxiety disorder: Secondary | ICD-10-CM

## 2014-01-19 DIAGNOSIS — R7309 Other abnormal glucose: Secondary | ICD-10-CM

## 2014-01-19 DIAGNOSIS — E669 Obesity, unspecified: Secondary | ICD-10-CM

## 2014-01-19 DIAGNOSIS — R739 Hyperglycemia, unspecified: Secondary | ICD-10-CM

## 2014-01-19 DIAGNOSIS — E785 Hyperlipidemia, unspecified: Secondary | ICD-10-CM

## 2014-01-19 DIAGNOSIS — I10 Essential (primary) hypertension: Secondary | ICD-10-CM

## 2014-01-19 NOTE — Progress Notes (Signed)
Pre visit review using our clinic review tool, if applicable. No additional management support is needed unless otherwise documented below in the visit note. 

## 2014-01-19 NOTE — Patient Instructions (Addendum)
Your mammogram will be due after mid July - don't forget to schedule it  I'm glad your anxiety is improved  Keep watching diet (Avoid red meat/ fried foods/ egg yolks/ fatty breakfast meats/ butter, cheese and high fat dairy/ and shellfish  ) and taking fish oil  Keep exercising

## 2014-01-19 NOTE — Progress Notes (Signed)
Subjective:    Patient ID: [name], female    DOB: [birth year], 68 y.o.   MRN: [id number]  HPI I have personally reviewed the Medicare Annual Wellness questionnaire and have noted 1. The patient's medical and social history 2. Their use of alcohol, tobacco or illicit drugs 3. Their current medications and supplements 4. The patient's functional ability including ADL's, fall risks, home safety risks and hearing or visual             impairment. 5. Diet and physical activities 6. Evidence for depression or mood disorders  The patients weight, height, BMI have been recorded in the chart and visual acuity is per eye clinic.  I have made referrals, counseling and provided education to the patient based review of the above and I have provided the pt with a written personalized care plan for preventive services.  See scanned forms.  Routine anticipatory guidance given to patient.  See health maintenance. Colon cancer screening 1/13 with 5 year recall (family hx ) Breast cancer screening 7/14 - nl mammogram - and had to have [place] f/u also  Self breast exam no lumps on self exam  No gyn problems at all (just hot flashes at night- tolerating those) Flu vaccine 9/14  Tetanus vaccine  1/10 Pneumovax - had a reaction to it - declines further ones /or the prevnar  Zoster vaccine 9/10   Advance directive- has a living will - husb is her POA  Cognitive function addressed- see scanned forms- and if abnormal then additional documentation follows. - overall pretty good / occ walks into a room and briefly forgets why-then it comes to her quickly  PMH and SH reviewed  Meds, vitals, and allergies reviewed.   ROS: See HPI.  Otherwise negative.    bp is stable today  No cp or palpitations or headaches or edema  No side effects to medicines  BP Readings from Last 3 Encounters:  01/19/14 128/82  12/14/13 130/82  02/26/13 130/78     Hyperglycemia Lab Results  Component Value Date   HGBA1C 6.1  01/13/2014   Down from 6.3 She is eating better   Wt is down 3 lb Cutting back on sweets and watching carbs  Doing the chair exercise program -20-30 min per day- she likes it  bmi is 31 Obese-is trying to loose weight  Also trying to get stronger as well- also does PT exercises at home   dexa 1/06 normal  Anxiety--seeing Dr Annitta Jersey - for psychiatry  Did inc her zoloft to 75 and plans to inc to 100 on may 19th - and she is doing a little better overall Adj to the new dose now (a little nausea and grogginess)  On same xanax   Had partial hyst in past Nl pap 07  Hyperlipidemia Failed meds and lipid clinic  Lab Results  Component Value Date   CHOL 328* 01/13/2014   CHOL 325* 01/15/2012   CHOL 328* 12/18/2010   Lab Results  Component Value Date   HDL 43.40 01/13/2014   HDL 53.40 01/15/2012   HDL 55.50 12/18/2010   Lab Results  Component Value Date   LDLCALC 262* 01/13/2014   Lab Results  Component Value Date   TRIG 112.0 01/13/2014   TRIG 131.0 01/15/2012   TRIG 168.0* 12/18/2010   Lab Results  Component Value Date   CHOLHDL 8 01/13/2014   CHOLHDL 6 01/15/2012   CHOLHDL 6 12/18/2010   Lab Results  Component Value Date  LDLDIRECT 242.8 01/15/2012   LDLDIRECT 237.0 12/18/2010   LDLDIRECT 227.8 09/20/2009   very low fat diet   Patient Active Problem List   Diagnosis Date Noted  . Chest pain 02/26/2013  . Encounter for Medicare annual wellness exam 01/14/2013  . Breast density 06/04/2012  . Abnormal mammogram 03/03/2012  . Obesity 01/15/2012  . Other screening mammogram 12/26/2010  . Stress reaction, emotional 12/26/2010  . Abnormal TSH 12/26/2010  . Routine general medical examination at a health care facility 12/14/2010  . MENOPAUSAL SYNDROME 10/04/2008  . Hyperglycemia 12/02/2007  . TREMOR 12/02/2007  . DEPRESSION 08/04/2007  . HYPERLIPIDEMIA 01/28/2007  . ANXIETY 01/28/2007  . HYPERTENSION 01/28/2007  . GERD 01/28/2007  . URINARY INCONTINENCE 01/28/2007  . COLONIC  POLYPS, HX OF 01/28/2007  . HERPES ZOSTER 01/20/2007  . DYSPHONIA 01/20/2007  . COUGH, CHRONIC 01/20/2007  . ALLERGY 01/20/2007   Past Medical History  Diagnosis Date  . Anxiety   . Panic disorder   . Depression   . Voice disorder   . Hx of colonic polyp   . GERD (gastroesophageal reflux disease)   . Hyperlipemia     Very high chol intol of statins-does not want to check it  . Hypertension   . Hyperglycemia   . Tremor     from anx  . Urinary incontinence     mixed  . Allergic rhinitis     gets allergy shot from Dr. Carlis Abbott  . History of shingles     at age 25- mild   Past Surgical History  Procedure Laterality Date  . Partial hysterectomy      with fibroid  . Nasal sinus surgery    . Bladder suspension    . Rectocele repair     History  Substance Use Topics  . Smoking status: Never Smoker   . Smokeless tobacco: Not on file  . Alcohol Use: No   Family History  Problem Relation Age of Onset  . Diabetes Mother   . Hyperlipidemia Mother   . Hypertension Mother   . Osteoarthritis Mother   . Cancer Father     Colon   Allergies  Allergen Reactions  . Atorvastatin     REACTION: shoulder pain  . Ezetimibe     REACTION: ?  . Lisinopril     REACTION: muscle pain  . Penicillins     REACTION: reaction not known  . Pneumococcal Vaccine Polyvalent     REACTION: severe local reaction  . Rosuvastatin     REACTION: leg pain  . Simvastatin     REACTION: muscle pain   Current Outpatient Prescriptions on File Prior to Visit  Medication Sig Dispense Refill  . ALPRAZolam (XANAX XR) 1 MG 24 hr tablet Take 1 mg by mouth daily.      Marland Kitchen amLODipine (NORVASC) 5 MG tablet TAKE 1 TABLET EVERY DAY  90 tablet  1  . Azelastine HCl (ASTEPRO) 0.15 % SOLN 2 sprays by Nasal route 2 (two) times daily as needed.  30 mL  11  . Calcium Carbonate-Vitamin D (CALCIUM 600+D PO) Take by mouth 2 (two) times daily.        . cetirizine (ZYRTEC) 10 MG tablet Take 10 mg by mouth daily.        .  Multiple Vitamin (MULTIVITAMIN) tablet Take 1 tablet by mouth daily.        . Omega-3 Fatty Acids (FISH OIL) 1200 MG CAPS Take by mouth daily.        Marland Kitchen  omeprazole (PRILOSEC) 20 MG capsule Take 20 mg by mouth daily.      Marland Kitchen omeprazole (PRILOSEC) 40 MG capsule Take 40 mg by mouth daily.      . ramipril (ALTACE) 10 MG capsule TAKE 1 CAPSULE EVERY DAY  90 capsule  1  . triamterene-hydrochlorothiazide (MAXZIDE-25) 37.5-25 MG per tablet Take 1 tablet by mouth daily. Take one half tablet by mouth daily  90 tablet  3   No current facility-administered medications on file prior to visit.    Review of Systems Review of Systems  Constitutional: Negative for fever, appetite change, fatigue and unexpected weight change.  Eyes: Negative for pain and visual disturbance.  Respiratory: Negative for cough and shortness of breath.   Cardiovascular: Negative for cp or palpitations    Gastrointestinal: Negative for nausea, diarrhea and constipation.  Genitourinary: Negative for urgency and frequency.  Skin: Negative for pallor or rash   Neurological: Negative for weakness, light-headedness, numbness and headaches.  Hematological: Negative for adenopathy. Does not bruise/bleed easily.  Psychiatric/Behavioral: Negative for dysphoric mood. The patient is nervous/anxious--this is some improved .         Objective:   Physical Exam  Constitutional: She appears well-developed and well-nourished. No distress.  obese and well appearing   HENT:  Head: Normocephalic and atraumatic.  Right Ear: External ear normal.  Left Ear: External ear normal.  Mouth/Throat: Oropharynx is clear and moist.  Eyes: Conjunctivae and EOM are normal. Pupils are equal, round, and reactive to light. No scleral icterus.  Neck: Normal range of motion. Neck supple. No JVD present. Carotid bruit is not present. No thyromegaly present.  Cardiovascular: Normal rate, regular rhythm, normal heart sounds and intact distal pulses.  Exam reveals  no gallop.   Pulmonary/Chest: Effort normal and breath sounds normal. No respiratory distress. She has no wheezes. She exhibits no tenderness.  Abdominal: Soft. Bowel sounds are normal. She exhibits no distension, no abdominal bruit and no mass. There is no tenderness.  Genitourinary: No breast swelling, tenderness, discharge or bleeding.  Breast exam: No mass, nodules, thickening, tenderness, bulging, retraction, inflamation, nipple discharge or skin changes noted.  No axillary or clavicular LA.      Musculoskeletal: Normal range of motion. She exhibits no edema and no tenderness.  Lymphadenopathy:    She has no cervical adenopathy.  Neurological: She is alert. She has normal reflexes. No cranial nerve deficit. She exhibits normal muscle tone. Coordination normal.  Skin: Skin is warm and dry. No rash noted. No erythema. No pallor.  Psychiatric: Her speech is normal and behavior is normal. Thought content normal. Her mood appears anxious. Her affect is not blunt, not labile and not inappropriate. She does not exhibit a depressed mood.  Less anxious than last visit           Assessment & Plan:

## 2014-01-20 ENCOUNTER — Encounter: Payer: Medicare Other | Admitting: Family Medicine

## 2014-01-20 NOTE — Assessment & Plan Note (Signed)
bp in fair control at this time  BP Readings from Last 1 Encounters:  01/19/14 128/82   No changes needed Disc lifstyle change with low sodium diet and exercise  Labs reviewed

## 2014-01-20 NOTE — Assessment & Plan Note (Signed)
Discussed how this problem influences overall health and the risks it imposes  Reviewed plan for weight loss with lower calorie diet (via better food choices and also portion control or program like weight watchers) and exercise building up to or more than 30 minutes 5 days per week including some aerobic activity    

## 2014-01-20 NOTE — Assessment & Plan Note (Signed)
Reviewed health habits including diet and exercise and skin cancer prevention Reviewed appropriate screening tests for age  Also reviewed health mt list, fam hx and immunization status , as well as social and family history   Labs reviewed  See HPI  

## 2014-01-20 NOTE — Assessment & Plan Note (Signed)
Disc goals for lipids and reasons to control them Rev labs with pt Rev low sat fat diet in detail Pt is totally intolerant of all meds and also failed tx at the lipid clinic Chol is very high and going up -likely hereditary  Will continue to monitor -disc poss new med coming on market

## 2014-01-20 NOTE — Assessment & Plan Note (Signed)
Lab Results  Component Value Date   HGBA1C 6.1 01/13/2014   overall good control  Disc low glycemic diet

## 2014-01-20 NOTE — Assessment & Plan Note (Signed)
Improved with psychiatry care/ inc zoloft  Will continue those visits  Enc exercise

## 2014-04-05 ENCOUNTER — Other Ambulatory Visit: Payer: Self-pay | Admitting: Family Medicine

## 2014-05-07 ENCOUNTER — Ambulatory Visit: Payer: Self-pay | Admitting: Anesthesiology

## 2014-05-07 ENCOUNTER — Telehealth: Payer: Self-pay | Admitting: *Deleted

## 2014-05-07 LAB — POTASSIUM: Potassium: 4 mmol/L (ref 3.5–5.1)

## 2014-05-07 NOTE — Telephone Encounter (Signed)
Pt is having surgery on 05/18/14 at [facility] and they are requesting pt's last OV note, labs and last EKG, records sent

## 2014-05-10 ENCOUNTER — Ambulatory Visit: Payer: Self-pay | Admitting: Family Medicine

## 2014-05-11 ENCOUNTER — Encounter: Payer: Self-pay | Admitting: Family Medicine

## 2014-05-11 ENCOUNTER — Ambulatory Visit: Payer: Self-pay | Admitting: Unknown Physician Specialty

## 2014-05-18 ENCOUNTER — Ambulatory Visit: Payer: Self-pay | Admitting: Orthopedic Surgery

## 2014-05-31 ENCOUNTER — Other Ambulatory Visit: Payer: Self-pay | Admitting: Family Medicine

## 2014-06-15 ENCOUNTER — Other Ambulatory Visit: Payer: Self-pay | Admitting: Family Medicine

## 2014-07-26 ENCOUNTER — Other Ambulatory Visit: Payer: Self-pay | Admitting: Family Medicine

## 2014-09-17 HISTORY — PX: HAND SURGERY: SHX662

## 2014-09-17 HISTORY — PX: MEDIAL PARTIAL KNEE REPLACEMENT: SHX5965

## 2014-09-25 ENCOUNTER — Other Ambulatory Visit: Payer: Self-pay | Admitting: Family Medicine

## 2014-10-13 ENCOUNTER — Ambulatory Visit: Payer: Self-pay | Admitting: Orthopedic Surgery

## 2014-11-17 ENCOUNTER — Ambulatory Visit: Payer: Self-pay | Admitting: Orthopedic Surgery

## 2014-11-30 ENCOUNTER — Inpatient Hospital Stay: Payer: Self-pay | Admitting: Orthopedic Surgery

## 2015-01-03 ENCOUNTER — Other Ambulatory Visit: Payer: Self-pay | Admitting: Family Medicine

## 2015-01-04 DIAGNOSIS — Z8739 Personal history of other diseases of the musculoskeletal system and connective tissue: Secondary | ICD-10-CM | POA: Insufficient documentation

## 2015-01-04 DIAGNOSIS — Z8639 Personal history of other endocrine, nutritional and metabolic disease: Secondary | ICD-10-CM | POA: Insufficient documentation

## 2015-01-04 DIAGNOSIS — Z8709 Personal history of other diseases of the respiratory system: Secondary | ICD-10-CM | POA: Insufficient documentation

## 2015-01-08 NOTE — Op Note (Signed)
PATIENT NAME:  [name], [name] MR#:  [id number] DATE OF BIRTH:  [birth year]  DATE OF PROCEDURE:  05/18/2014   PREOPERATIVE DIAGNOSES: Left knee osteoarthritis, medial meniscus tear.   POSTOPERATIVE DIAGNOSIS: Left knee osteoarthritis, medial meniscus tear, with lateral meniscus tear.  PROCEDURE: Arthroscopy, left knee, with partial medial and lateral meniscectomy.   ANESTHESIA: General.   SURGEON: Laurene Footman, M.D.   DESCRIPTION OF PROCEDURE: The patient was brought to the operating room and, after adequate anesthesia was obtained, the left leg was prepped and draped in the usual sterile fashion with a tourniquet applied along with an arthroscopic legholder. After appropriate patient identification, timeout procedures were completed. An inferolateral portal was made and the arthroscope was introduced. Inspection of the suprapatellar pouch revealed moderate synovitis. There was significant patellofemoral degenerative change present, with some significant wear in the trochlea. Coming around medially, an inferomedial portal was made, and on probing, there was a complex tear of the posterior horn of the meniscus, consistent with preop MRI findings.   There was significant loss of articular cartilage in the medial compartment as well, without exposed bone, but nearly exposed bone on both tibial and femoral condyles over most of the joint surface. The anterior cruciate ligament was intact on probing centrally. When going to the lateral compartment, there was a flap tear of the middle third of the lateral meniscus. The articular cartilage was relatively well preserved in the lateral compartment.  Going back to the medial compartment, a meniscal punch was used to debride the meniscus back to a stable margin with an ArthroCare wand to smooth edges. The meniscal punch and ArthroCare wand were also used to resect a portion of the lateral meniscus. Pre- and post-procedure pictures were obtained.   After this  was completed, outflow cannula was placed in the instrumentation portal, and the knee was thoroughly irrigated to remove any debris. After all instrumentation was withdrawn, the wounds were closed with simple interrupted 4-0 nylon and 20 mL of 0.5% Sensorcaine was infiltrated into the area of the portals for postoperative analgesia.   There were no complications. No specimen. Condition to the recovery room was stable.   ____________________________ Laurene Footman, MD mjm:MT D: 05/19/2014 08:23:22 ET T: 05/19/2014 14:18:05 ET JOB#: [id number]  cc: Laurene Footman, MD, <Dictator> Laurene Footman MD ELECTRONICALLY SIGNED 05/19/2014 16:37

## 2015-01-16 NOTE — Discharge Summary (Signed)
PATIENT NAME:  [name], [name] MR#:  [id number] DATE OF BIRTH:  October 16, 1945  DATE OF ADMISSION:  11/30/2014 DATE OF DISCHARGE:  12/02/2014  ADMITTING DIAGNOSIS:  Left knee medial compartment osteoarthritis.   DISCHARGE DIAGNOSIS:  Left knee medial compartment osteoarthritis.   OPERATION:  On 11/30/2014, the patient had left knee Oxford unicompartmental arthroplasty.   SURGEON:  Laurene Footman, MD   ANESTHESIA:  Spinal.   ESTIMATED BLOOD LOSS:  50 mL.   IMPLANTS:  Oxford unicompartmental knee replacement, size medium femur, size B left medial tibial component, and a medium size 3 mm thick left medial bearing surface. The patient was stabilized, brought to the recovery room, and then brought down to the orthopedic floor.   HISTORY:  The patient is a 69 year old female who presented for evaluation for an upcoming partial knee replacement. The patient has had progressive left knee pain over the last year. It has been refractory to conservative treatment including cortisone injections and arthroscopy and oral medication.   PHYSICAL EXAMINATION: GENERAL:  Alert female with an antalgic gait on the left.  HEART:  Regular rate and rhythm.  LUNGS:  Clear to auscultation.  MUSCULOSKELETAL:  In regard to the left lower extremity, the patient has full extension with 120 degrees of flexion. The patient has tenderness along the medial joint line. The patient has no retropatellar discomfort.   HOSPITAL COURSE:  After initial admission on 11/30/2014, the patient was brought to the orthopedic floor. On postoperative day 1, the patient had a hemoglobin of 12.7. The patient ambulated with physical therapy, doing up to 30 feet x 2 and then was ready to go to rehabilitation based on her pain and family situation on 12/02/2014.   CONDITION AT DISCHARGE:  Stable.   DISPOSITION:  The patient was sent to rehabilitation.   DISCHARGE INSTRUCTIONS:  The patient will follow up with [facility] in 2  weeks. The patient will do weight bear as tolerated on the affected leg. The patient will elevate her leg with 1 to 2 pillows and keep her heels off the bed. The patient will use TED hose on both legs, removed at nighttime. The patient is encouraged to do cough and deep breathing. The patient's diet is regular. The patient will use a Polar Care to decrease swelling and keep dressing on as well as try not to get it wet, although she will have it changed on a p.r.n. basis. The patient will call the clinic if there is any bright red bleeding, calf pain, bowel or bladder difficulty, or any fever greater than 101.5. The patient will do physical therapy and occupational therapy per protocol.   DISCHARGE MEDICATIONS:  Fish oil 1200 mg 1 capsule daily, calcium 600 one tablet b.i.d., amlodipine 5 mg 1 tablet daily, Astepro 205.5 mcg 2 sprays as needed, Zyrtec 10 mg 1 tablet daily, multivitamin 1 tablet daily, Prilosec 40 mg 1 capsule daily, Prilosec 20 mg 1 tablet daily, Altace 10 mg 1 capsule daily, Zoloft 100 mg 1 tablet daily, hydrochlorothiazide/triamterene 25/37.5 capsule one-half daily, Xanax 0.5 mg 1 tablet daily at 3:00 p.m., oxycodone 5 mg 1 tablet q. 4 hours as needed for severe pain, Lovenox 40 mg subcutaneously for 14 days and then discontinue and start on aspirin 81 mg once a day, Senokot-S 1 tablet b.i.d.    ____________________________ Lenna Sciara. Reche Dixon, [place] jtm:nb D: 12/02/2014 06:47:42 ET T: 12/02/2014 07:18:30 ET JOB#: [id number]  cc: J. Reche Dixon, PA, <Dictator> J Shaliah [facility] PA ELECTRONICALLY SIGNED  12/03/2014 9:33 

## 2015-01-16 NOTE — Op Note (Signed)
PATIENT NAME:  [name], [name] MR#:  [id number] DATE OF BIRTH:  02-Dec-1945  DATE OF PROCEDURE:  11/30/2014  PREOPERATIVE DIAGNOSIS: Left knee medial compartment osteoarthritis.   POSTOPERATIVE DIAGNOSIS: Left knee medial compartment osteoarthritis.   PROCEDURE: Left knee Oxford unicompartmental arthroplasty.   SURGEON:  Hessie Knows, MD.   ANESTHESIA: Spinal.   DESCRIPTION OF PROCEDURE:  The patient was brought to the operating room and after adequate spinal anesthesia was obtained, the patient was placed with the left leg in a leg holder, allowing for flexion of the knee with a tourniquet applied to the upper thigh.  The right leg was allowed to bend slightly. The leg was then prepped and draped in the usual sterile fashion with appropriate patient identification timeout procedure completed. The tourniquet was raised at the start of the case.   A medial parapatellar arthrotomy was performed.  Inspection revealed mild patellofemoral degenerative change, normal lateral compartment, intact ACL and significant degenerative changes to the weight-bearing aspect of the femoral and tibial condyles. The signature cutting block was applied to the distal femur and distal femoral drill holes were made, followed by the reaming with the zero spigot.   Next, the tibia was addressed with the tibia guide from the signature set after excising the anterior horn of the meniscus. Three pins were placed and the vertical cut was created adjacent to the ACL. Following this, the cutting block was applied and proximal tibia cut carried out with the residual meniscus removed at this point. The posterior cut on the femur was then performed using the appropriate cutting guide for the medium, based on the preop templating.    A medium trial was placed on the femur and flexion, a 4 mm feeler gauge could fit in extension zero, so a 4 mm reaming was performed of the distal femur. Following this, the 4 feeler gauge fit in both  flexion and extension. The size B tibia was placed and the toothbrush saw used to open up this groove.  The trials were placed and a 3 mm insert gave better fit than the 4.  The 4 was somewhat tight, so the femur and tibia components were obtained. The anterior chamfer on the femoral component was also used for the upper part of the implant.    Next, the knee was thoroughly irrigated and the knee was infiltrated with Exparel diluted with saline to attain postoperative analgesia. The  bony surfaces were thoroughly irrigated and dried. The tibial component was cemented into place first, followed by the femoral component with a 4 mm feeler gauge with the knee in slight flexion as the cement set. Excess cement was removed and the 3 mm trial gave full range of motion and was stable.  It was chosen as the final implant. It was popped into place. The wound was then repaired using a heavy quill for the capsulotomy, 2-0 quill subcutaneously and skin staples. Xeroform, 4 x 4's, Webril and Ace wrap were applied and the patient was sent to the recovery room in stable condition.   ESTIMATED BLOOD LOSS: 50.   COMPLICATIONS: None.   SPECIMENS: None.   TOURNIQUET:  63 minutes for 300 mmHg.  IMPLANTS:  Oxford unicompartmental knee replacement, size medium femur, size B left medial tibial component and a medium size 3 mm thick left medial bearing surface.    ____________________________ Laurene Footman, MD mjm:at D: 11/30/2014 18:44:24 ET T: 11/30/2014 21:23:04 ET JOB#: [id number]  cc: Laurene Footman, MD, <Dictator> [facility]  MD ELECTRONICALLY SIGNED 12/01/2014 8:12

## 2015-01-17 ENCOUNTER — Telehealth: Payer: Self-pay | Admitting: Family Medicine

## 2015-01-17 DIAGNOSIS — I10 Essential (primary) hypertension: Secondary | ICD-10-CM

## 2015-01-17 DIAGNOSIS — E785 Hyperlipidemia, unspecified: Secondary | ICD-10-CM

## 2015-01-17 DIAGNOSIS — R739 Hyperglycemia, unspecified: Secondary | ICD-10-CM

## 2015-01-17 NOTE — Telephone Encounter (Signed)
-----   Message from Ellamae Sia sent at 01/14/2015 11:14 AM EDT ----- Regarding: Lab orders for Tuesday, 5.3.16 Patient is scheduled for CPX labs, please order future labs, Thanks , Karna Christmas

## 2015-01-18 ENCOUNTER — Other Ambulatory Visit (INDEPENDENT_AMBULATORY_CARE_PROVIDER_SITE_OTHER): Payer: Medicare Other

## 2015-01-18 DIAGNOSIS — E785 Hyperlipidemia, unspecified: Secondary | ICD-10-CM | POA: Diagnosis not present

## 2015-01-18 DIAGNOSIS — I10 Essential (primary) hypertension: Secondary | ICD-10-CM | POA: Diagnosis not present

## 2015-01-18 DIAGNOSIS — R739 Hyperglycemia, unspecified: Secondary | ICD-10-CM | POA: Diagnosis not present

## 2015-01-18 LAB — COMPREHENSIVE METABOLIC PANEL
ALT: 13 U/L (ref 0–35)
AST: 16 U/L (ref 0–37)
Albumin: 4 g/dL (ref 3.5–5.2)
Alkaline Phosphatase: 81 U/L (ref 39–117)
BILIRUBIN TOTAL: 0.6 mg/dL (ref 0.2–1.2)
BUN: 19 mg/dL (ref 6–23)
CO2: 32 mEq/L (ref 19–32)
Calcium: 9.9 mg/dL (ref 8.4–10.5)
Chloride: 100 mEq/L (ref 96–112)
Creatinine, Ser: 0.92 mg/dL (ref 0.40–1.20)
GFR: 64.39 mL/min (ref >60.00)
Glucose, Bld: 116 mg/dL — ABNORMAL HIGH (ref 70–99)
POTASSIUM: 4.5 meq/L (ref 3.5–5.1)
Sodium: 137 mEq/L (ref 135–145)
Total Protein: 7.2 g/dL (ref 6.0–8.3)

## 2015-01-18 LAB — CBC WITH DIFFERENTIAL/PLATELET
BASOS ABS: 0 10*3/uL (ref 0.0–0.1)
Basophils Relative: 0.5 % (ref 0.0–3.0)
EOS PCT: 2.3 % (ref 0.0–5.0)
Eosinophils Absolute: 0.2 10*3/uL (ref 0.0–0.7)
HCT: 42 % (ref 36.0–46.0)
Hemoglobin: 14.3 g/dL (ref 12.0–15.0)
LYMPHS ABS: 2.8 10*3/uL (ref 0.7–4.0)
Lymphocytes Relative: 33.6 % (ref 12.0–46.0)
MCHC: 33.9 g/dL (ref 30.0–36.0)
MCV: 86.3 fl (ref 78.0–100.0)
Monocytes Absolute: 0.7 10*3/uL (ref 0.1–1.0)
Monocytes Relative: 8.7 % (ref 3.0–12.0)
Neutro Abs: 4.6 10*3/uL (ref 1.4–7.7)
Neutrophils Relative %: 54.9 % (ref 43.0–77.0)
Platelets: 289 10*3/uL (ref 150.0–400.0)
RBC: 4.87 Mil/uL (ref 3.87–5.11)
RDW: 13.7 % (ref 11.5–15.5)
WBC: 8.4 10*3/uL (ref 4.0–10.5)

## 2015-01-18 LAB — LIPID PANEL
CHOL/HDL RATIO: 8
Cholesterol: 338 mg/dL — ABNORMAL HIGH (ref 0–200)
HDL: 42.1 mg/dL (ref >39.00)
LDL CALC: 269 mg/dL — AB (ref 0–99)
NonHDL: 295.9
Triglycerides: 136 mg/dL (ref 0.0–149.0)
VLDL: 27.2 mg/dL (ref 0.0–40.0)

## 2015-01-18 LAB — TSH: TSH: 3.64 u[IU]/mL (ref 0.35–4.50)

## 2015-01-18 LAB — HEMOGLOBIN A1C: HEMOGLOBIN A1C: 6.2 % (ref 4.6–6.5)

## 2015-01-21 ENCOUNTER — Encounter: Payer: Self-pay | Admitting: Family Medicine

## 2015-01-21 ENCOUNTER — Ambulatory Visit (INDEPENDENT_AMBULATORY_CARE_PROVIDER_SITE_OTHER): Payer: Medicare Other | Admitting: Family Medicine

## 2015-01-21 VITALS — BP 122/70 | HR 74 | Temp 98.1°F | Ht 64.0 in | Wt 178.5 lb

## 2015-01-21 DIAGNOSIS — E785 Hyperlipidemia, unspecified: Secondary | ICD-10-CM

## 2015-01-21 DIAGNOSIS — Z0000 Encounter for general adult medical examination without abnormal findings: Secondary | ICD-10-CM | POA: Diagnosis not present

## 2015-01-21 DIAGNOSIS — E669 Obesity, unspecified: Secondary | ICD-10-CM

## 2015-01-21 DIAGNOSIS — I10 Essential (primary) hypertension: Secondary | ICD-10-CM

## 2015-01-21 DIAGNOSIS — R739 Hyperglycemia, unspecified: Secondary | ICD-10-CM

## 2015-01-21 MED ORDER — RAMIPRIL 10 MG PO CAPS: 10.0000 mg | ORAL_CAPSULE | Freq: Every day | ORAL | Status: DC

## 2015-01-21 MED ORDER — AMLODIPINE BESYLATE 5 MG PO TABS: 5.0000 mg | ORAL_TABLET | Freq: Every day | ORAL | Status: DC

## 2015-01-21 NOTE — Progress Notes (Signed)
Subjective:    Patient ID: [name], female    DOB: [birth year], 69 y.o.   MRN: [id number]  HPI Here for annual medicare wellness visit as well as chronic/acute medical problems   I have personally reviewed the Medicare Annual Wellness questionnaire and have noted 1. The patient's medical and social history 2. Their use of alcohol, tobacco or illicit drugs 3. Their current medications and supplements 4. The patient's functional ability including ADL's, fall risks, home safety risks and hearing or visual             impairment. 5. Diet and physical activities 6. Evidence for depression or mood disorders  The patients weight, height, BMI have been recorded in the chart and visual acuity is per eye clinic.  I have made referrals, counseling and provided education to the patient based review of the above and I have provided the pt with a written personalized care plan for preventive services. Reviewed and updated provider list, see scanned forms.  Is in PT after knee surgery /partial knee repl and making progress but hurt her calf muscle in rehab-is a challenge   See scanned forms.  Routine anticipatory guidance given to patient.  See health maintenance. Colon cancer screening 11/13 rectal polyps - 5 year recall  Breast cancer screening mammogram 8/15 nl  Self breast exam no lumps  No gyn problems  Flu vaccine 9/15 Tetanus vaccine 1/10 Pneumovax 1/04 pretty severe local reaction to that -will not get the prevnar  Zoster vaccine 9/10  dexa times 2 have been normal  Advance directive has a living will and power of attorney  Cognitive function addressed- see scanned forms- and if abnormal then additional documentation follows. Memory is good   PMH and SH reviewed  Meds, vitals, and allergies reviewed.   ROS: See HPI.  Otherwise negative.      wt is down 6 lb with bmi of 30  Not eating as much/ better portion control and avoids sweets/sugar more than she used to   Hep C  screening - declines/not high risk   bp is stable today  No cp or palpitations or headaches or edema  No side effects to medicines  BP Readings from Last 3 Encounters:  01/21/15 122/70  01/19/14 128/82  12/14/13 130/82     Hyperlipidemia Lab Results  Component Value Date   CHOL 338* 01/18/2015   CHOL 328* 01/13/2014   CHOL 325* 01/15/2012   Lab Results  Component Value Date   HDL 42.10 01/18/2015   HDL 43.40 01/13/2014   HDL 53.40 01/15/2012   Lab Results  Component Value Date   LDLCALC 269* 01/18/2015   LDLCALC 262* 01/13/2014   Lab Results  Component Value Date   TRIG 136.0 01/18/2015   TRIG 112.0 01/13/2014   TRIG 131.0 01/15/2012   Lab Results  Component Value Date   CHOLHDL 8 01/18/2015   CHOLHDL 8 01/13/2014   CHOLHDL 6 01/15/2012   Lab Results  Component Value Date   LDLDIRECT 242.8 01/15/2012   LDLDIRECT 237.0 12/18/2010   LDLDIRECT 227.8 09/20/2009    May be a candidate for med like Repatha if she wanted to return to the lipid clinic-she will think about it  She does watch her diet closely for sat fat   Hyperglycemia-quite stable  Lab Results  Component Value Date   HGBA1C 6.2 01/18/2015    Obesity Wt down 6 lb with bmi of 30    Chemistry  Component Value Date/Time   NA 137 01/18/2015 1107   K 4.5 01/18/2015 1107   K 4.0 05/07/2014 1139   CL 100 01/18/2015 1107   CO2 32 01/18/2015 1107   BUN 19 01/18/2015 1107   CREATININE 0.92 01/18/2015 1107      Component Value Date/Time   CALCIUM 9.9 01/18/2015 1107   ALKPHOS 81 01/18/2015 1107   AST 16 01/18/2015 1107   ALT 13 01/18/2015 1107   BILITOT 0.6 01/18/2015 1107     glucose 116  Lab Results  Component Value Date   WBC 8.4 01/18/2015   HGB 14.3 01/18/2015   HCT 42.0 01/18/2015   MCV 86.3 01/18/2015   PLT 289.0 01/18/2015    Lab Results  Component Value Date   TSH 3.64 01/18/2015     Patient Active Problem List   Diagnosis Date Noted  . H/O diabetes mellitus  01/04/2015  . H/O arthritis 01/04/2015  . History of hay fever 01/04/2015  . Encounter for Medicare annual wellness exam 01/14/2013  . Obesity 01/15/2012  . Stress reaction, emotional 12/26/2010  . MENOPAUSAL SYNDROME 10/04/2008  . Hyperglycemia 12/02/2007  . TREMOR 12/02/2007  . DEPRESSION 08/04/2007  . Hyperlipidemia 01/28/2007  . ANXIETY 01/28/2007  . Essential hypertension 01/28/2007  . GERD 01/28/2007  . URINARY INCONTINENCE 01/28/2007  . COLONIC POLYPS, HX OF 01/28/2007  . HERPES ZOSTER 01/20/2007  . DYSPHONIA 01/20/2007  . COUGH, CHRONIC 01/20/2007  . ALLERGY 01/20/2007   Past Medical History  Diagnosis Date  . Anxiety   . Panic disorder   . Depression   . Voice disorder   . Hx of colonic polyp   . GERD (gastroesophageal reflux disease)   . Hyperlipemia     Very high chol intol of statins-does not want to check it  . Hypertension   . Hyperglycemia   . Tremor     from anx  . Urinary incontinence     mixed  . Allergic rhinitis     gets allergy shot from Dr. Carlis Abbott  . History of shingles     at age 10- mild   Past Surgical History  Procedure Laterality Date  . Partial hysterectomy      with fibroid  . Nasal sinus surgery    . Bladder suspension    . Rectocele repair    . Abdominal hysterectomy    . Medial partial knee replacement Left 2016   History  Substance Use Topics  . Smoking status: Never Smoker   . Smokeless tobacco: Not on file  . Alcohol Use: No   Family History  Problem Relation Age of Onset  . Diabetes Mother   . Hyperlipidemia Mother   . Hypertension Mother   . Osteoarthritis Mother   . Cancer Father     Colon  . Syncope episode Daughter    Allergies  Allergen Reactions  . Atorvastatin     REACTION: shoulder pain  . Ezetimibe     REACTION: ?  . Lisinopril     REACTION: muscle pain  . Molds & Smuts   . Penicillins     REACTION: reaction not known  . Pneumococcal Vaccine Polyvalent     REACTION: severe local reaction  .  Pollen Extract   . Rosuvastatin     REACTION: leg pain  . Simvastatin     REACTION: muscle pain   Current Outpatient Prescriptions on File Prior to Visit  Medication Sig Dispense Refill  . Azelastine HCl (ASTEPRO) 0.15 % SOLN 2  sprays by Nasal route 2 (two) times daily as needed. 30 mL 11  . Calcium Carbonate-Vitamin D (CALCIUM 600+D PO) Take by mouth 2 (two) times daily.      . cetirizine (ZYRTEC) 10 MG tablet Take 10 mg by mouth daily.      . Multiple Vitamin (MULTIVITAMIN) tablet Take 1 tablet by mouth daily.      . Omega-3 Fatty Acids (FISH OIL) 1200 MG CAPS Take by mouth daily.      Marland Kitchen omeprazole (PRILOSEC) 40 MG capsule TAKE 1 CAPSULE 30 MIN PRIOR TO FIRST MEAL 90 capsule 0  . triamterene-hydrochlorothiazide (MAXZIDE-25) 37.5-25 MG per tablet TAKE ONE TABLET EVERY DAY (Patient taking differently: TAKE 1/2 (HALF) TABLET EVERY DAY) 90 tablet 1   No current facility-administered medications on file prior to visit.      Review of Systems Review of Systems  Constitutional: Negative for fever, appetite change, fatigue and unexpected weight change.  Eyes: Negative for pain and visual disturbance.  Respiratory: Negative for cough and shortness of breath.   Cardiovascular: Negative for cp or palpitations    Gastrointestinal: Negative for nausea, diarrhea and constipation.  Genitourinary: Negative for urgency and frequency.  Skin: Negative for pallor or rash   Neurological: Negative for weakness, light-headedness, numbness and headaches.  Hematological: Negative for adenopathy. Does not bruise/bleed easily.  Psychiatric/Behavioral: Negative for dysphoric mood. The patient is not nervous/anxious.         Objective:   Physical Exam  Constitutional: She appears well-developed and well-nourished. No distress.  obese and well appearing   HENT:  Head: Normocephalic and atraumatic.  Right Ear: External ear normal.  Left Ear: External ear normal.  Mouth/Throat: Oropharynx is clear and  moist.  Chronically hoarse voice   Eyes: Conjunctivae and EOM are normal. Pupils are equal, round, and reactive to light. No scleral icterus.  Neck: Normal range of motion. Neck supple. No JVD present. Carotid bruit is not present. No thyromegaly present.  Cardiovascular: Normal rate, regular rhythm, normal heart sounds and intact distal pulses.  Exam reveals no gallop.   Pulmonary/Chest: Effort normal and breath sounds normal. No respiratory distress. She has no wheezes. She exhibits no tenderness.  Abdominal: Soft. Bowel sounds are normal. She exhibits no distension, no abdominal bruit and no mass. There is no tenderness.  Genitourinary: No breast swelling, tenderness, discharge or bleeding.  Breast exam: No mass, nodules, thickening, tenderness, bulging, retraction, inflamation, nipple discharge or skin changes noted.  No axillary or clavicular LA.      Musculoskeletal: Normal range of motion. She exhibits no edema or tenderness.  No acute joint changes   Lymphadenopathy:    She has no cervical adenopathy.  Neurological: She is alert. She has normal reflexes. No cranial nerve deficit. She exhibits normal muscle tone. Coordination normal.  Skin: Skin is warm and dry. No rash noted. No erythema. No pallor.  Psychiatric: She has a normal mood and affect.          Assessment & Plan:   Problem List Items Addressed This Visit    Encounter for Medicare annual wellness exam    Reviewed health habits including diet and exercise and skin cancer prevention Reviewed appropriate screening tests for age  Also reviewed health mt list, fam hx and immunization status , as well as social and family history   See HPI Labs reviewed in detail  Choose not to give prevnar in light of significant/severe local reaction from pneumovax 23 Enc further wt loss with diet  and exercise       Essential hypertension - Primary    bp in fair control at this time  BP Readings from Last 1 Encounters:  01/21/15  122/70   No changes needed Disc lifstyle change with low sodium diet and exercise  Labs reviewed       Relevant Medications   ramipril (ALTACE) 10 MG capsule   amLODipine (NORVASC) 5 MG tablet   Hyperglycemia    Lab Results  Component Value Date   HGBA1C 6.2 01/18/2015   Keeping this stable with diet/exercise Enc further wt loss       Hyperlipidemia    Disc goals for lipids and reasons to control them Rev labs with pt Rev low sat fat diet in detail Very high LDL and does not tolerate any statins Disc opt of Repatha or other meds of its class and given handout on it  Offered ref to cardiology/lipid clinic to see if she would be a candidate/ if affordable  She will consider-does not currently want to go      Relevant Medications   ramipril (ALTACE) 10 MG capsule   amLODipine (NORVASC) 5 MG tablet   Obesity    Discussed how this problem influences overall health and the risks it imposes  Reviewed plan for weight loss with lower calorie diet (via better food choices and also portion control or program like weight watchers) and exercise building up to or more than 30 minutes 5 days per week including some aerobic activity   Enc further wt loss- is down 6 lb

## 2015-01-21 NOTE — Progress Notes (Signed)
Pre visit review using our clinic review tool, if applicable. No additional management support is needed unless otherwise documented below in the visit note. 

## 2015-01-21 NOTE — Patient Instructions (Signed)
Take care of yourself I am going to hold off the prevnar vaccine due to your severe local reaction in the past with pneumonia vaccine  For cholesterol,  Avoid red meat/ fried foods/ egg yolks/ fatty breakfast meats/ butter, cheese and high fat dairy/ and shellfish   Here is info on a new medication for cholesterol that you may be a good candidate for - if you ever want to go back to the lipid clinic to discuss it , please let me know

## 2015-01-23 NOTE — Assessment & Plan Note (Signed)
Lab Results  Component Value Date   HGBA1C 6.2 01/18/2015   Keeping this stable with diet/exercise Enc further wt loss

## 2015-01-23 NOTE — Assessment & Plan Note (Signed)
Reviewed health habits including diet and exercise and skin cancer prevention Reviewed appropriate screening tests for age  Also reviewed health mt list, fam hx and immunization status , as well as social and family history   See HPI Labs reviewed in detail  Choose not to give prevnar in light of significant/severe local reaction from pneumovax 23 Enc further wt loss with diet and exercise

## 2015-01-23 NOTE — Assessment & Plan Note (Signed)
Discussed how this problem influences overall health and the risks it imposes  Reviewed plan for weight loss with lower calorie diet (via better food choices and also portion control or program like weight watchers) and exercise building up to or more than 30 minutes 5 days per week including some aerobic activity   Enc further wt loss- is down 6 lb

## 2015-01-23 NOTE — Assessment & Plan Note (Signed)
bp in fair control at this time  BP Readings from Last 1 Encounters:  01/21/15 122/70   No changes needed Disc lifstyle change with low sodium diet and exercise  Labs reviewed

## 2015-01-23 NOTE — Assessment & Plan Note (Signed)
Disc goals for lipids and reasons to control them Rev labs with pt Rev low sat fat diet in detail Very high LDL and does not tolerate any statins Disc opt of Repatha or other meds of its class and given handout on it  Offered ref to cardiology/lipid clinic to see if she would be a candidate/ if affordable  She will consider-does not currently want to go

## 2015-01-24 ENCOUNTER — Encounter: Payer: Self-pay | Admitting: Family Medicine

## 2015-02-21 ENCOUNTER — Other Ambulatory Visit: Payer: Self-pay

## 2015-02-21 DIAGNOSIS — F32A Depression, unspecified: Secondary | ICD-10-CM | POA: Insufficient documentation

## 2015-02-21 DIAGNOSIS — F320 Major depressive disorder, single episode, mild: Secondary | ICD-10-CM | POA: Insufficient documentation

## 2015-02-21 DIAGNOSIS — Z8679 Personal history of other diseases of the circulatory system: Secondary | ICD-10-CM | POA: Insufficient documentation

## 2015-02-22 ENCOUNTER — Other Ambulatory Visit: Payer: Self-pay

## 2015-02-22 ENCOUNTER — Ambulatory Visit (INDEPENDENT_AMBULATORY_CARE_PROVIDER_SITE_OTHER): Payer: 59 | Admitting: Psychiatry

## 2015-02-22 ENCOUNTER — Encounter: Payer: Self-pay | Admitting: Psychiatry

## 2015-02-22 VITALS — BP 110/78 | HR 100 | Temp 98.3°F | Ht 64.0 in

## 2015-02-22 DIAGNOSIS — F330 Major depressive disorder, recurrent, mild: Secondary | ICD-10-CM | POA: Diagnosis not present

## 2015-02-22 DIAGNOSIS — F411 Generalized anxiety disorder: Secondary | ICD-10-CM | POA: Diagnosis not present

## 2015-02-22 MED ORDER — ALPRAZOLAM ER 0.5 MG PO TB24: 0.5000 mg | ORAL_TABLET | Freq: Every day | ORAL | Status: DC

## 2015-02-22 MED ORDER — SERTRALINE HCL 50 MG PO TABS: ORAL_TABLET | ORAL | Status: DC

## 2015-02-22 NOTE — Progress Notes (Signed)
[facility] MD/PA/NP OP Progress Note  02/22/2015 10:42 AM [name]  MRN:  [id number]  Subjective:  Patient is a 69 year old female who presented for the follow-up appointment. She reported that she recently had knee replacement surgery and is still recuperating from the same patient stated that she is going for the trigger finger surgery tomorrow in her hand as it is bothering her and she is still having pain. Patient stated that she was unable to get the Xanax prescription as she is also on Xanax XR and would like to continue the same. She reported that she decreased the dose of Zoloft to 75 mg as she was unable to sleep. She reported that she still has problems with sleep but she feels that her anxiety is under control with the help of Zoloft at this time. She appeared calm and cooperative during the interview. Patient currently denied having any suicidal ideations or plans. She is concerned about the surgery tomorrow in her hand.  Chief Complaint:  Chief Complaint    Depression; Anxiety     Visit Diagnosis:  No diagnosis found.  Past Medical History:  Past Medical History  Diagnosis Date  . Anxiety   . Panic disorder   . Depression   . Voice disorder   . Hx of colonic polyp   . GERD (gastroesophageal reflux disease)   . Hyperlipemia     Very high chol intol of statins-does not want to check it  . Hypertension   . Hyperglycemia   . Tremor     from anx  . Urinary incontinence     mixed  . Allergic rhinitis     gets allergy shot from Dr. Carlis Abbott  . History of shingles     at age 82- mild    Past Surgical History  Procedure Laterality Date  . Partial hysterectomy      with fibroid  . Nasal sinus surgery    . Bladder suspension    . Rectocele repair    . Abdominal hysterectomy    . Medial partial knee replacement Left 2016  . Joint replacement      knee replacement - march 15   Family History:  Family History  Problem Relation Age of Onset  . Diabetes Mother   .  Hyperlipidemia Mother   . Hypertension Mother   . Osteoarthritis Mother   . Cancer Father     Colon  . Syncope episode Daughter    Social History:  History   Social History  . Marital Status: Married    Spouse Name: N/A  . Number of Children: 2  . Years of Education: N/A   Occupational History  . 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 Smoking status: Never Smoker   . Smokeless tobacco: Never Used  . Alcohol Use: No  . Drug Use: No  . Sexual Activity: No   Other Topics Concern  . None   Social History Narrative   Additional History:  Lives with husband. She stated that she spends time by exercise and doing hosehold chores. She stated that she is having issues due to knee replacement now. She is not able to walk far now.   Assessment:   Musculoskeletal: Strength & Muscle Tone: decreased Gait & Station: unsteady Patient leans: N/A  Psychiatric Specialty Exam: HPI  Review of Systems  Constitutional: Negative for weight loss.  HENT: Negative for hearing loss.   Eyes: Negative for  photophobia.  Respiratory: Negative for hemoptysis.   Cardiovascular: Negative for palpitations.  Gastrointestinal: Negative for vomiting.  Genitourinary: Negative for urgency.  Musculoskeletal: Positive for myalgias and joint pain.  Neurological: Negative for tremors.  Endo/Heme/Allergies: Negative for environmental allergies.  Psychiatric/Behavioral: Negative for depression and hallucinations. The patient has insomnia.     Blood pressure 110/78, pulse 100, temperature 98.3 F (36.8 C), temperature source Tympanic, height 5\' 4"  (1.626 m), last menstrual period 09/18/1987, SpO2 93 %.There is no weight on file to calculate BMI.  General Appearance: Casual  Eye Contact:  Fair  Speech:  Clear and Coherent  Volume:  Normal  Mood:  Anxious  Affect:  Appropriate  Thought Process:  Logical  Orientation:  Full (Time, Place, and Person)  Thought  Content:  WDL  Suicidal Thoughts:  No  Homicidal Thoughts:  No  Memory:  Immediate;   Good  Judgement:  Fair  Insight:  Fair  Psychomotor Activity:  Normal  Concentration:  Fair  Recall:  [employer] of Knowledge: Fair  Language: Fair  Akathisia:  No  Handed:  Right  AIMS (if indicated):  none  Assets:  Communication Skills  ADL's:  Intact  Cognition: WNL  Sleep:  5-6    Is the patient at risk to self?  No. Has the patient been a risk to self in the past 6 months?  No. Has the patient been a risk to self within the distant past?  No. Is the patient a risk to others?  No. Has the patient been a risk to others in the past 6 months?  No.   Has the patient been a risk to others within the distant past?  No.  Current Medications: Current Outpatient Prescriptions  Medication Sig Dispense Refill  . ALPRAZolam (XANAX XR) 0.5 MG 24 hr tablet Take 1 tablet by mouth daily.    Marland Kitchen amLODipine (NORVASC) 5 MG tablet Take 1 tablet (5 mg total) by mouth daily. 90 tablet 3  . Azelastine HCl (ASTEPRO) 0.15 % SOLN 2 sprays by Nasal route 2 (two) times daily as needed. 30 mL 11  . Calcium Carbonate-Vitamin D (CALCIUM 600+D PO) Take by mouth 2 (two) times daily.      . cetirizine (ZYRTEC) 10 MG tablet Take 10 mg by mouth daily.      . Multiple Vitamin (MULTIVITAMIN) tablet Take 1 tablet by mouth daily.      . Omega-3 Fatty Acids (FISH OIL) 1200 MG CAPS Take by mouth daily.      Marland Kitchen omeprazole (PRILOSEC) 20 MG capsule Take 20 mg by mouth daily before supper.    . ramipril (ALTACE) 10 MG capsule Take 1 capsule (10 mg total) by mouth daily. 90 capsule 3  . triamterene-hydrochlorothiazide (MAXZIDE-25) 37.5-25 MG per tablet TAKE ONE TABLET EVERY DAY (Patient taking differently: TAKE 1/2 (HALF) TABLET EVERY DAY) 90 tablet 1  . ciprofloxacin (CIPRO) 500 MG tablet     . enoxaparin (LOVENOX) 40 MG/0.4ML injection     . EPINEPHrine 0.3 mg/0.3 mL IJ SOAJ injection as needed.    Marland Kitchen omeprazole (PRILOSEC) 40 MG  capsule TAKE 1 CAPSULE 30 MIN PRIOR TO FIRST MEAL (Patient not taking: Reported on 02/22/2015) 90 capsule 0  . oxyCODONE (OXY IR/ROXICODONE) 5 MG immediate release tablet Take by mouth.     No current facility-administered medications for this visit.    Medical Decision Making:  Established Problem, Stable/Improving (1)  Treatment Plan Summary:Medication management   Discussed with patient about her medications  and we will continue her on the medications as follows Zoloft 75 mg by mouth daily and I will dispense her 90 day supply at this time She will continue on Xanax XR 0.5 milligram by mouth daily Discussed with her about the adverse effects of the medication and she demonstrated understanding   SSRI Discussed with pt about the [facility] Box warnings, increased risk of suicidal thinking when starting the medications.  GI side effects, sexual side effects, increase in manic or hypomanic symptoms as well as the discontinuation syndromes.Advised about withdrawal symptoms if stopped immediately. Pt demonstrated understanding.    BENZODIAZEPINES: Benzodiazepine medications may be habit forming.  If you feel the medication is not working as well, do not take more than the prescribed dose.  Taking too much of a benzodiazepine medication may cause respiratory depression and death.  Always store medication safely and away from children.  Benzodiazepine medications can cause drowsiness.  Avoid driving, operating machinery, or doing anything dangerous if you are not alert.Do not drink alcohol when taking benzodiazepine medications.  This will increase the sedative effect, and could lead to alcohol toxicity and death.     More than 50% of the time spent in psychoeducation, counseling and coordination of care.    This note was generated in part or whole with voice recognition software. Voice regonition is usually quite accurate but there are transcription errors that can and very often do occur. I  apologize for any typographical errors that were not detected and corrected.    Rainey Pines 02/22/2015, 10:42 AM

## 2015-03-15 ENCOUNTER — Other Ambulatory Visit: Payer: Self-pay | Admitting: Family Medicine

## 2015-04-07 ENCOUNTER — Other Ambulatory Visit: Payer: Self-pay | Admitting: Family Medicine

## 2015-04-26 ENCOUNTER — Ambulatory Visit: Payer: 59 | Admitting: Psychiatry

## 2015-05-24 ENCOUNTER — Ambulatory Visit (INDEPENDENT_AMBULATORY_CARE_PROVIDER_SITE_OTHER): Payer: 59 | Admitting: Psychiatry

## 2015-05-24 ENCOUNTER — Encounter: Payer: Self-pay | Admitting: Psychiatry

## 2015-05-24 VITALS — BP 122/68 | HR 92 | Temp 97.8°F | Ht 64.5 in | Wt 178.2 lb

## 2015-05-24 DIAGNOSIS — F331 Major depressive disorder, recurrent, moderate: Secondary | ICD-10-CM

## 2015-05-24 MED ORDER — SERTRALINE HCL 50 MG PO TABS: ORAL_TABLET | ORAL | Status: DC

## 2015-05-24 MED ORDER — ALPRAZOLAM ER 0.5 MG PO TB24: 0.5000 mg | ORAL_TABLET | Freq: Every day | ORAL | Status: DC

## 2015-05-24 NOTE — Progress Notes (Signed)
BH MD/PA/NP OP Progress Note  05/24/2015 10:09 AM [name]  MRN:  [id number]  Subjective:  Patient is a 69 year old female who presented for the follow-up appointment. She reported that  concerned about her husband who has recently been diagnosed with endocarditis after he got infected tooth and had some dental procedure done. She reported that he was sick for almost 3 weeks with pneumonia and went to the emergency room. He was diagnosed endocarditis. He will be going to the [facility] next week to have the mitral valve replacement done. She reported that she has been stressed out because of him and has been taking care of him. She reported that she is currently stable on her medications. She is still recuperating from her knee surgery. Patient currently denied having any perceptual disturbances. She denied having any suicidal ideations or plans. She sleeps well with the help of Xanax and takes Zoloft in the morning. She is asking for 90 day supply of her medications.   Chief Complaint:  Chief Complaint    Follow-up; Medication Refill     Visit Diagnosis:  No diagnosis found.  Past Medical History:  Past Medical History  Diagnosis Date  . Anxiety   . Panic disorder   . Depression   . Voice disorder   . Hx of colonic polyp   . GERD (gastroesophageal reflux disease)   . Hyperlipemia     Very high chol intol of statins-does not want to check it  . Hypertension   . Hyperglycemia   . Tremor     from anx  . Urinary incontinence     mixed  . Allergic rhinitis     gets allergy shot from Dr. Carlis Abbott  . History of shingles     at age 89- mild    Past Surgical History  Procedure Laterality Date  . Partial hysterectomy      with fibroid  . Nasal sinus surgery    . Bladder suspension    . Rectocele repair    . Abdominal hysterectomy    . Medial partial knee replacement Left 2016  . Joint replacement      knee replacement - march 15   Family History:  Family History  Problem  Relation Age of Onset  . Diabetes Mother   . Hyperlipidemia Mother   . Hypertension Mother   . Osteoarthritis Mother   . Cancer Father     Colon  . Syncope episode Daughter   . Leukemia Sister    Social History:  Social History   Social History  . Marital Status: Married    Spouse Name: N/A  . Number of Children: 2  . Years of Education: N/A   Occupational History  . 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 Smoking status: Never Smoker   . Smokeless tobacco: Never Used  . Alcohol Use: No  . Drug Use: No  . Sexual Activity: No   Other Topics Concern  . None   Social History Narrative   Additional History:  Lives with husband. She stated that she spends time by exercise and doing hosehold chores. She stated that she is having issues due to knee replacement now.   Assessment:   Musculoskeletal: Strength & Muscle Tone: decreased Gait & Station: unsteady Patient leans: N/A  Psychiatric Specialty Exam: HPI   Review of Systems  Constitutional: Negative for weight loss.  HENT: Negative for hearing loss.  Eyes: Negative for photophobia.  Respiratory: Negative for hemoptysis.   Cardiovascular: Negative for palpitations.  Gastrointestinal: Negative for vomiting.  Genitourinary: Negative for urgency.  Musculoskeletal: Positive for myalgias, back pain and joint pain.  Neurological: Negative for tremors.  Endo/Heme/Allergies: Negative for environmental allergies.  Psychiatric/Behavioral: Positive for depression. Negative for hallucinations. The patient is nervous/anxious and has insomnia.   All other systems reviewed and are negative.   Blood pressure 122/68, pulse 92, temperature 97.8 F (36.6 C), temperature source Tympanic, height 5' 4.5" (1.638 m), weight 178 lb 3.2 oz (80.831 kg), last menstrual period 09/18/1987, SpO2 98 %.Body mass index is 30.13 kg/(m^2).  General Appearance: Casual  Eye Contact:  Fair  Speech:  Clear  and Coherent  Volume:  Normal  Mood:  Anxious  Affect:  Appropriate  Thought Process:  Logical  Orientation:  Full (Time, Place, and Person)  Thought Content:  WDL  Suicidal Thoughts:  No  Homicidal Thoughts:  No  Memory:  Immediate;   Good  Judgement:  Fair  Insight:  Fair  Psychomotor Activity:  Normal  Concentration:  Fair  Recall:  [employer] of Knowledge: Fair  Language: Fair  Akathisia:  No  Handed:  Right  AIMS (if indicated):  none  Assets:  Communication Skills  ADL's:  Intact  Cognition: WNL  Sleep:  5-6    Is the patient at risk to self?  No. Has the patient been a risk to self in the past 6 months?  No. Has the patient been a risk to self within the distant past?  No. Is the patient a risk to others?  No. Has the patient been a risk to others in the past 6 months?  No.   Has the patient been a risk to others within the distant past?  No.  Current Medications: Current Outpatient Prescriptions  Medication Sig Dispense Refill  . ALPRAZolam (XANAX XR) 0.5 MG 24 hr tablet Take 1 tablet (0.5 mg total) by mouth daily. 30 tablet 3  . amLODipine (NORVASC) 5 MG tablet Take 1 tablet (5 mg total) by mouth daily. 90 tablet 3  . azelastine (ASTELIN) 0.1 % nasal spray     . Azelastine HCl (ASTEPRO) 0.15 % SOLN 2 sprays by Nasal route 2 (two) times daily as needed. 30 mL 11  . Calcium Carbonate-Vitamin D (CALCIUM 600+D PO) Take by mouth 2 (two) times daily.      . cetirizine (ZYRTEC) 10 MG tablet Take 10 mg by mouth daily.      Marland Kitchen EPINEPHrine 0.3 mg/0.3 mL IJ SOAJ injection as needed.    . Multiple Vitamin (MULTIVITAMIN) tablet Take 1 tablet by mouth daily.      . Omega-3 Fatty Acids (FISH OIL) 1200 MG CAPS Take by mouth daily.      Marland Kitchen omeprazole (PRILOSEC) 40 MG capsule TAKE 1 CAPSULE  BY MOUTH EVERY DAY 30 MIN PRIOR TO FIRST MEAL 90 capsule 1  . ramipril (ALTACE) 10 MG capsule Take 1 capsule (10 mg total) by mouth daily. 90 capsule 3  . sertraline (ZOLOFT) 50 MG tablet Take  1.5 tablets daily 135 tablet 1  . triamterene-hydrochlorothiazide (MAXZIDE-25) 37.5-25 MG per tablet TAKE ONE TABLET BY MOUTH EVERY DAY 90 tablet 2   No current facility-administered medications for this visit.    Medical Decision Making:  Established Problem, Stable/Improving (1)  Treatment Plan Summary:Medication management   Discussed with patient about her medications and we will continue her on the medications as follows Zoloft  75 mg by mouth daily and I will dispense her 90 day supply at this time She will continue on Xanax XR 0.5 milligram by mouth daily Discussed with her about the adverse effects of the medication and she demonstrated understanding   SSRI Discussed with pt about the [facility] Box warnings, increased risk of suicidal thinking when starting the medications.  GI side effects, sexual side effects, increase in manic or hypomanic symptoms as well as the discontinuation syndromes.Advised about withdrawal symptoms if stopped immediately. Pt demonstrated understanding.    BENZODIAZEPINES: Benzodiazepine medications may be habit forming.  If you feel the medication is not working as well, do not take more than the prescribed dose.  Taking too much of a benzodiazepine medication may cause respiratory depression and death.  Always store medication safely and away from children.  Benzodiazepine medications can cause drowsiness.  Avoid driving, operating machinery, or doing anything dangerous if you are not alert.Do not drink alcohol when taking benzodiazepine medications.  This will increase the sedative effect, and could lead to alcohol toxicity and death.     More than 50% of the time spent in psychoeducation, counseling and coordination of care.    This note was generated in part or whole with voice recognition software. Voice regonition is usually quite accurate but there are transcription errors that can and very often do occur. I apologize for any typographical errors that  were not detected and corrected.    Rainey Pines 05/24/2015, 10:09 AM

## 2015-05-26 ENCOUNTER — Encounter: Payer: Self-pay | Admitting: Family Medicine

## 2015-05-31 ENCOUNTER — Other Ambulatory Visit: Payer: Self-pay | Admitting: Family Medicine

## 2015-05-31 DIAGNOSIS — Z1231 Encounter for screening mammogram for malignant neoplasm of breast: Secondary | ICD-10-CM

## 2015-06-03 ENCOUNTER — Other Ambulatory Visit: Payer: Self-pay | Admitting: Family Medicine

## 2015-06-03 ENCOUNTER — Ambulatory Visit
Admission: RE | Admit: 2015-06-03 | Discharge: 2015-06-03 | Disposition: A | Discharge status: Home / Self Care | Payer: Medicare Other | Source: Ambulatory Visit | Attending: Family Medicine | Admitting: Family Medicine

## 2015-06-03 DIAGNOSIS — Z1231 Encounter for screening mammogram for malignant neoplasm of breast: Secondary | ICD-10-CM

## 2015-06-07 ENCOUNTER — Other Ambulatory Visit: Payer: Self-pay | Admitting: Psychiatry

## 2015-07-19 ENCOUNTER — Other Ambulatory Visit: Payer: Medicare Other

## 2015-07-26 ENCOUNTER — Ambulatory Visit: Payer: Medicare Other | Admitting: Family Medicine

## 2015-08-17 NOTE — Progress Notes (Signed)
Still taking refilled

## 2015-08-18 ENCOUNTER — Encounter: Payer: Self-pay | Admitting: Psychiatry

## 2015-08-18 ENCOUNTER — Ambulatory Visit (INDEPENDENT_AMBULATORY_CARE_PROVIDER_SITE_OTHER): Payer: 59 | Admitting: Psychiatry

## 2015-08-18 VITALS — BP 128/68 | HR 90 | Temp 97.0°F | Ht 64.5 in | Wt 180.0 lb

## 2015-08-18 DIAGNOSIS — F331 Major depressive disorder, recurrent, moderate: Secondary | ICD-10-CM | POA: Diagnosis not present

## 2015-08-18 NOTE — Progress Notes (Signed)
BH MD/PA/NP OP Progress Note  08/18/2015 10:38 AM [name]  MRN:  [id number]  Subjective:  Patient is a 69 year old female who presented for the follow-up appointment. She reported that her husband had the surgery done at [facility]  in November after he was diagnosed with endocarditis. She reported that it was a long surgery and he remained in the hospital for almost a week. He is now recuperating after the surgery. She reported that she was very much concerned about him but now he is doing well. Patient stated that she did well during the whole time as she was stable on her medications and felt that the medications really helped her. She takes Zoloft at night and Xanax during the daytime. She feels that the medication combination is working well for her and she does not want to change the dosage at this time. She has enough supply of medications. She is now planning for the Christmas with her family members. She appeared in good spirits during this interview. She stated that her husband will start the cardiac  rehabilitation in the next couple of weeks  Patient stated that she has enough supply of medications and does not need a refill at this time.  Chief Complaint:  Chief Complaint    Follow-up; Medication Refill     Visit Diagnosis:     ICD-9-CM ICD-10-CM   1. MDD (major depressive disorder), recurrent episode, moderate ([place]) 296.32 F33.1     Past Medical History:  Past Medical History  Diagnosis Date  . Anxiety   . Panic disorder   . Depression   . Voice disorder   . Hx of colonic polyp   . GERD (gastroesophageal reflux disease)   . Hyperlipemia     Very high chol intol of statins-does not want to check it  . Hypertension   . Hyperglycemia   . Tremor     from anx  . Urinary incontinence     mixed  . Allergic rhinitis     gets allergy shot from Dr. Carlis Abbott  . History of shingles     at age 7- mild    Past Surgical History  Procedure Laterality Date  . Partial  hysterectomy      with fibroid  . Nasal sinus surgery    . Bladder suspension    . Rectocele repair    . Abdominal hysterectomy    . Medial partial knee replacement Left 2016  . Joint replacement      knee replacement - march 15   Family History:  Family History  Problem Relation Age of Onset  . Diabetes Mother   . Hyperlipidemia Mother   . Hypertension Mother   . Osteoarthritis Mother   . Cancer Father     Colon  . Syncope episode Daughter   . Leukemia Sister    Social History:  Social History   Social History  . Marital Status: Married    Spouse Name: N/A  . Number of Children: 2  . Years of Education: N/A   Occupational History  . 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 Smoking status: Never Smoker   . Smokeless tobacco: Never Used  . Alcohol Use: No  . Drug Use: No  . Sexual Activity: No   Other Topics Concern  . None   Social History Narrative   Additional History:  Lives with husband. She stated that she spends time by exercise  and doing hosehold chores. She stated that she is having issues due to knee replacement now.   Assessment:   Musculoskeletal: Strength & Muscle Tone: decreased Gait & Station: unsteady Patient leans: N/A  Psychiatric Specialty Exam: HPI   Review of Systems  Constitutional: Negative for weight loss.  HENT: Negative for hearing loss.   Eyes: Negative for photophobia.  Respiratory: Negative for hemoptysis.   Cardiovascular: Negative for palpitations.  Gastrointestinal: Negative for vomiting.  Genitourinary: Negative for urgency.  Musculoskeletal: Positive for myalgias, back pain and joint pain.  Neurological: Negative for tremors.  Endo/Heme/Allergies: Negative for environmental allergies.  Psychiatric/Behavioral: Positive for depression. Negative for hallucinations. The patient is nervous/anxious and has insomnia.   All other systems reviewed and are negative.   Blood pressure  128/68, pulse 90, temperature 97 F (36.1 C), temperature source Tympanic, height 5' 4.5" (1.638 m), weight 180 lb (81.647 kg), last menstrual period 09/18/1987, SpO2 96 %.Body mass index is 30.43 kg/(m^2).  General Appearance: Casual  Eye Contact:  Fair  Speech:  Clear and Coherent  Volume:  Normal  Mood:  Anxious  Affect:  Appropriate  Thought Process:  Logical  Orientation:  Full (Time, Place, and Person)  Thought Content:  WDL  Suicidal Thoughts:  No  Homicidal Thoughts:  No  Memory:  Immediate;   Good  Judgement:  Fair  Insight:  Fair  Psychomotor Activity:  Normal  Concentration:  Fair  Recall:  [employer] of Knowledge: Fair  Language: Fair  Akathisia:  No  Handed:  Right  AIMS (if indicated):  none  Assets:  Communication Skills  ADL's:  Intact  Cognition: WNL  Sleep:  5-6    Is the patient at risk to self?  No. Has the patient been a risk to self in the past 6 months?  No. Has the patient been a risk to self within the distant past?  No. Is the patient a risk to others?  No. Has the patient been a risk to others in the past 6 months?  No.   Has the patient been a risk to others within the distant past?  No.  Current Medications: Current Outpatient Prescriptions  Medication Sig Dispense Refill  . ALPRAZolam (ALPRAZOLAM XR) 0.5 MG 24 hr tablet Take 1 tablet (0.5 mg total) by mouth daily. 30 tablet 1  . amLODipine (NORVASC) 5 MG tablet Take 1 tablet (5 mg total) by mouth daily. 90 tablet 3  . azelastine (ASTELIN) 0.1 % nasal spray     . Azelastine HCl (ASTEPRO) 0.15 % SOLN 2 sprays by Nasal route 2 (two) times daily as needed. 30 mL 11  . Calcium Carbonate-Vitamin D (CALCIUM 600+D PO) Take by mouth 2 (two) times daily.      . cetirizine (ZYRTEC) 10 MG tablet Take 10 mg by mouth daily.      Marland Kitchen EPINEPHrine 0.3 mg/0.3 mL IJ SOAJ injection as needed.    . Multiple Vitamin (MULTIVITAMIN) tablet Take 1 tablet by mouth daily.      . Omega-3 Fatty Acids (FISH OIL) 1200 MG  CAPS Take by mouth daily.      Marland Kitchen omeprazole (PRILOSEC) 40 MG capsule TAKE 1 CAPSULE  BY MOUTH EVERY DAY 30 MIN PRIOR TO FIRST MEAL 90 capsule 1  . ramipril (ALTACE) 10 MG capsule Take 1 capsule (10 mg total) by mouth daily. 90 capsule 3  . sertraline (ZOLOFT) 50 MG tablet Take 1.5 tablets daily 135 tablet 1  . triamterene-hydrochlorothiazide (MAXZIDE-25) 37.5-25 MG  per tablet TAKE ONE TABLET BY MOUTH EVERY DAY 90 tablet 2   No current facility-administered medications for this visit.    Medical Decision Making:  Established Problem, Stable/Improving (1)  Treatment Plan Summary:Medication management   Discussed with patient about her medications and  will continue her on the medications as follows Zoloft 75 mg by mouth daily and  Xanax XR 0.5 milligram by mouth daily  She has enough supply of the medication   Discussed with her about the adverse effects of the medication and she demonstrated understanding  Follow-up in 4 months  SSRI Discussed with pt about the [facility] Box warnings, increased risk of suicidal thinking when starting the medications.  GI side effects, sexual side effects, increase in manic or hypomanic symptoms as well as the discontinuation syndromes.Advised about withdrawal symptoms if stopped immediately. Pt demonstrated understanding.    BENZODIAZEPINES: Benzodiazepine medications may be habit forming.  If you feel the medication is not working as well, do not take more than the prescribed dose.  Taking too much of a benzodiazepine medication may cause respiratory depression and death.  Always store medication safely and away from children.  Benzodiazepine medications can cause drowsiness.  Avoid driving, operating machinery, or doing anything dangerous if you are not alert.Do not drink alcohol when taking benzodiazepine medications.  This will increase the sedative effect, and could lead to alcohol toxicity and death.     More than 50% of the time spent in  psychoeducation, counseling and coordination of care.    This note was generated in part or whole with voice recognition software. Voice regonition is usually quite accurate but there are transcription errors that can and very often do occur. I apologize for any typographical errors that were not detected and corrected.    Rainey Pines 08/18/2015, 10:38 AM

## 2015-08-23 ENCOUNTER — Ambulatory Visit: Payer: 59 | Admitting: Psychiatry

## 2015-10-05 ENCOUNTER — Other Ambulatory Visit: Payer: Self-pay | Admitting: Psychiatry

## 2015-10-13 NOTE — Telephone Encounter (Signed)
Alprazolam XR refilled.

## 2015-10-13 NOTE — Telephone Encounter (Signed)
faxed - confirmed rx for alprazolam xr .5mg  id # [id number] order # [id number]

## 2015-10-15 ENCOUNTER — Other Ambulatory Visit: Payer: Self-pay | Admitting: Family Medicine

## 2015-12-06 ENCOUNTER — Ambulatory Visit (INDEPENDENT_AMBULATORY_CARE_PROVIDER_SITE_OTHER): Payer: 59 | Admitting: Psychiatry

## 2015-12-06 ENCOUNTER — Encounter: Payer: Self-pay | Admitting: Psychiatry

## 2015-12-06 VITALS — BP 120/72 | HR 86 | Temp 98.2°F | Ht 64.5 in | Wt 185.4 lb

## 2015-12-06 DIAGNOSIS — F331 Major depressive disorder, recurrent, moderate: Secondary | ICD-10-CM | POA: Diagnosis not present

## 2015-12-06 DIAGNOSIS — F411 Generalized anxiety disorder: Secondary | ICD-10-CM | POA: Diagnosis not present

## 2015-12-06 MED ORDER — ALPRAZOLAM 0.5 MG PO TABS: 0.5000 mg | ORAL_TABLET | Freq: Every evening | ORAL | Status: DC | PRN

## 2015-12-06 MED ORDER — SERTRALINE HCL 50 MG PO TABS: ORAL_TABLET | ORAL | Status: DC

## 2015-12-06 NOTE — Progress Notes (Signed)
BH MD/PA/NP OP Progress Note  12/06/2015 10:45 AM [name]  MRN:  [id number]  Subjective:  Patient is a 70 year old female who presented for the follow-up appointment. She reported that she is doing better and has been compliant with her medications. Patient reported that she is planning to go to [place] in 3 weeks to visit her sister. Patient reported that her anxiety symptoms are  improving and she is trying to wean herself of the Xanax. She wants to switch to the regular Xanax instead of the Xanax XR.Marland Kitchen She reported that the medication makes her tired and sluggish during the daytime. She is also taking Zoloft on a regular basis. Patient currently denied having any adverse effects of the medications. Patient reported that she sleeps well at night. Her husband is also recuperating from his endocarditis at this time.  We discussed at length about her medications and the adverse effects related to them and she demonstrated understanding. She is agreeable to the medication adjustment at this time.  Chief Complaint:  Chief Complaint    Follow-up; Medication Refill     Visit Diagnosis:     ICD-9-CM ICD-10-CM   1. MDD (major depressive disorder), recurrent episode, moderate (HCC) 296.32 F33.1   2. Generalized anxiety disorder 300.02 F41.1     Past Medical History:  Past Medical History  Diagnosis Date  . Anxiety   . Panic disorder   . Depression   . Voice disorder   . Hx of colonic polyp   . GERD (gastroesophageal reflux disease)   . Hyperlipemia     Very high chol intol of statins-does not want to check it  . Hypertension   . Hyperglycemia   . Tremor     from anx  . Urinary incontinence     mixed  . Allergic rhinitis     gets allergy shot from Dr. Carlis Abbott  . History of shingles     at age 2- mild    Past Surgical History  Procedure Laterality Date  . Partial hysterectomy      with fibroid  . Nasal sinus surgery    . Bladder suspension    . Rectocele repair    . Abdominal  hysterectomy    . Medial partial knee replacement Left 2016  . Joint replacement      knee replacement - march 15   Family History:  Family History  Problem Relation Age of Onset  . Diabetes Mother   . Hyperlipidemia Mother   . Hypertension Mother   . Osteoarthritis Mother   . Cancer Father     Colon  . Syncope episode Daughter   . Leukemia Sister    Social History:  Social History   Social History  . Marital Status: Married    Spouse Name: N/A  . Number of Children: 2  . Years of Education: N/A   Occupational History  . 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 Smoking status: Never Smoker   . Smokeless tobacco: Never Used  . Alcohol Use: No  . Drug Use: No  . Sexual Activity: No   Other Topics Concern  . None   Social History Narrative   Additional History:  Lives with husband. She stated that she spends time by exercise and doing hosehold chores. She stated that she is having issues due to knee replacement now.   Assessment:   Musculoskeletal: Strength & Muscle Tone: within normal limits Gait &  Station: normal Patient leans: N/A  Psychiatric Specialty Exam: HPI   Review of Systems  Constitutional: Negative for weight loss.  HENT: Negative for hearing loss.   Eyes: Negative for photophobia.  Respiratory: Negative for hemoptysis.   Cardiovascular: Negative for palpitations.  Gastrointestinal: Negative for vomiting.  Genitourinary: Negative for urgency.  Musculoskeletal: Positive for myalgias, back pain and joint pain.  Neurological: Negative for tremors.  Endo/Heme/Allergies: Negative for environmental allergies.  Psychiatric/Behavioral: Positive for depression. Negative for hallucinations. The patient is nervous/anxious and has insomnia.   All other systems reviewed and are negative.   Blood pressure 120/72, pulse 86, temperature 98.2 F (36.8 C), temperature source Tympanic, height 5' 4.5" (1.638 m), weight  185 lb 6.4 oz (84.097 kg), last menstrual period 09/18/1987, SpO2 96 %.Body mass index is 31.34 kg/(m^2).  General Appearance: Casual  Eye Contact:  Fair  Speech:  Clear and Coherent  Volume:  Normal  Mood:  Anxious  Affect:  Appropriate  Thought Process:  Logical  Orientation:  Full (Time, Place, and Person)  Thought Content:  WDL  Suicidal Thoughts:  No  Homicidal Thoughts:  No  Memory:  Immediate;   Good  Judgement:  Fair  Insight:  Fair  Psychomotor Activity:  Normal  Concentration:  Fair  Recall:  [employer] of Knowledge: Fair  Language: Fair  Akathisia:  No  Handed:  Right  AIMS (if indicated):  none  Assets:  Communication Skills  ADL's:  Intact  Cognition: WNL  Sleep:  5-6    Is the patient at risk to self?  No. Has the patient been a risk to self in the past 6 months?  No. Has the patient been a risk to self within the distant past?  No. Is the patient a risk to others?  No. Has the patient been a risk to others in the past 6 months?  No.   Has the patient been a risk to others within the distant past?  No.  Current Medications: Current Outpatient Prescriptions  Medication Sig Dispense Refill  . ALPRAZOLAM XR 0.5 MG 24 hr tablet TAKE ONE TABLET BY MOUTH EVERY DAY 90 tablet 1  . amLODipine (NORVASC) 5 MG tablet TAKE ONE TABLET BY MOUTH EVERY DAY 90 tablet 1  . azelastine (ASTELIN) 0.1 % nasal spray     . Azelastine HCl (ASTEPRO) 0.15 % SOLN 2 sprays by Nasal route 2 (two) times daily as needed. 30 mL 11  . Calcium Carbonate-Vitamin D (CALCIUM 600+D PO) Take by mouth 2 (two) times daily.      . cetirizine (ZYRTEC) 10 MG tablet Take 10 mg by mouth daily.      Marland Kitchen EPINEPHrine 0.3 mg/0.3 mL IJ SOAJ injection as needed.    . Multiple Vitamin (MULTIVITAMIN) tablet Take 1 tablet by mouth daily.      . Omega-3 Fatty Acids (FISH OIL) 1200 MG CAPS Take by mouth daily.      Marland Kitchen omeprazole (PRILOSEC) 40 MG capsule TAKE 1 CAPSULE EVERY DAY 30 MIN PRIOR TOFIRST MEAL 90 capsule 1   . ramipril (ALTACE) 10 MG capsule TAKE 1 CAPSULE BY MOUTH EVERY DAY 90 capsule 1  . sertraline (ZOLOFT) 50 MG tablet Take 1.5 tablets daily 135 tablet 1  . triamterene-hydrochlorothiazide (MAXZIDE-25) 37.5-25 MG per tablet TAKE ONE TABLET BY MOUTH EVERY DAY 90 tablet 2   No current facility-administered medications for this visit.    Medical Decision Making:  Established Problem, Stable/Improving (1)  Treatment Plan Summary:Medication management  Discussed with patient about her medications and  will continue her on the medications as follows Zoloft 75 mg by mouth daily and  Xanax  0.5 milligram by mouth daily She will advised to take Xanax at bedtime and half pill on a when necessary basis and she demonstrated understanding.    Discussed with her about the adverse effects of the medication and she demonstrated understanding  Follow-up in 1 months  SSRI Discussed with pt about the [facility] Box warnings, increased risk of suicidal thinking when starting the medications.  GI side effects, sexual side effects, increase in manic or hypomanic symptoms as well as the discontinuation syndromes.Advised about withdrawal symptoms if stopped immediately. Pt demonstrated understanding.    BENZODIAZEPINES: Benzodiazepine medications may be habit forming.  If you feel the medication is not working as well, do not take more than the prescribed dose.  Taking too much of a benzodiazepine medication may cause respiratory depression and death.  Always store medication safely and away from children.  Benzodiazepine medications can cause drowsiness.  Avoid driving, operating machinery, or doing anything dangerous if you are not alert.Do not drink alcohol when taking benzodiazepine medications.  This will increase the sedative effect, and could lead to alcohol toxicity and death.     More than 50% of the time spent in psychoeducation, counseling and coordination of care.    This note was generated in part  or whole with voice recognition software. Voice regonition is usually quite accurate but there are transcription errors that can and very often do occur. I apologize for any typographical errors that were not detected and corrected.   Rainey Pines, MD  12/06/2015, 10:45 AM

## 2015-12-13 ENCOUNTER — Other Ambulatory Visit: Payer: Self-pay | Admitting: Family Medicine

## 2015-12-13 ENCOUNTER — Ambulatory Visit: Payer: 59 | Admitting: Psychiatry

## 2016-01-04 ENCOUNTER — Ambulatory Visit (INDEPENDENT_AMBULATORY_CARE_PROVIDER_SITE_OTHER): Payer: 59 | Admitting: Psychiatry

## 2016-01-04 ENCOUNTER — Encounter: Payer: Self-pay | Admitting: Psychiatry

## 2016-01-04 VITALS — BP 120/78 | HR 82 | Temp 97.8°F | Ht 64.5 in | Wt 185.4 lb

## 2016-01-04 DIAGNOSIS — F411 Generalized anxiety disorder: Secondary | ICD-10-CM

## 2016-01-04 DIAGNOSIS — F331 Major depressive disorder, recurrent, moderate: Secondary | ICD-10-CM

## 2016-01-04 MED ORDER — TRAZODONE HCL 50 MG PO TABS: 50.0000 mg | ORAL_TABLET | Freq: Every day | ORAL | Status: DC

## 2016-01-04 NOTE — Progress Notes (Signed)
BH MD/PA/NP OP Progress Note  01/04/2016 10:55 AM [name]  MRN:  [id number]  Subjective:  Patient is a 70 year old female who presented for the follow-up appointment. She reported that she is doing better and has been compliant with her medications. Patient reported that she has noticed some improvement since her medications were adjusted. She is not feeling so tired during the daytime as she has decrease the dose of Xanax to half pill during the day. She still taking one pill at night. She stated that she wakes up tired in the morning. She is willing to decrease the dose of Xanax as well. However she reported that she will have difficulty sleeping at night. We discussed about the different medications options. She is agreeable to starting trazodone at night. She appeared well groomed during the interview we discussed about different options and her lifestyle during this interview. Patient appeared calm and cooperative.   We discussed at length about her medications and the adverse effects related to them and she demonstrated understanding. She is agreeable to the medication adjustment at this time.  Chief Complaint:  Chief Complaint    Follow-up; Medication Refill     Visit Diagnosis:     ICD-9-CM ICD-10-CM   1. MDD (major depressive disorder), recurrent episode, moderate (HCC) 296.32 F33.1   2. Generalized anxiety disorder 300.02 F41.1     Past Medical History:  Past Medical History  Diagnosis Date  . Anxiety   . Panic disorder   . Depression   . Voice disorder   . Hx of colonic polyp   . GERD (gastroesophageal reflux disease)   . Hyperlipemia     Very high chol intol of statins-does not want to check it  . Hypertension   . Hyperglycemia   . Tremor     from anx  . Urinary incontinence     mixed  . Allergic rhinitis     gets allergy shot from Dr. Carlis Abbott  . History of shingles     at age 70- mild    Past Surgical History  Procedure Laterality Date  . Partial  hysterectomy      with fibroid  . Nasal sinus surgery    . Bladder suspension    . Rectocele repair    . Abdominal hysterectomy    . Medial partial knee replacement Left 2016  . Joint replacement      knee replacement - march 15   Family History:  Family History  Problem Relation Age of Onset  . Diabetes Mother   . Hyperlipidemia Mother   . Hypertension Mother   . Osteoarthritis Mother   . Cancer Father     Colon  . Syncope episode Daughter   . Leukemia Sister    Social History:  Social History   Social History  . Marital Status: Married    Spouse Name: N/A  . Number of Children: 2  . Years of Education: N/A   Occupational History  . 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 Smoking status: Never Smoker   . Smokeless tobacco: Never Used  . Alcohol Use: No  . Drug Use: No  . Sexual Activity: No   Other Topics Concern  . None   Social History Narrative   Additional History:  Lives with husband. She stated that she spends time by exercise and doing hosehold chores. She stated that she is having issues due to knee replacement now.  Assessment:   Musculoskeletal: Strength & Muscle Tone: within normal limits Gait & Station: normal Patient leans: N/A  Psychiatric Specialty Exam: HPI  Review of Systems  Constitutional: Negative for weight loss.  HENT: Negative for hearing loss.   Eyes: Negative for photophobia.  Respiratory: Negative for hemoptysis.   Cardiovascular: Negative for palpitations.  Gastrointestinal: Negative for vomiting.  Genitourinary: Negative for urgency.  Musculoskeletal: Positive for myalgias, back pain and joint pain.  Neurological: Negative for tremors.  Endo/Heme/Allergies: Negative for environmental allergies.  Psychiatric/Behavioral: Positive for depression. Negative for hallucinations. The patient is nervous/anxious and has insomnia.   All other systems reviewed and are negative.   Blood  pressure 120/78, pulse 82, temperature 97.8 F (36.6 C), temperature source Tympanic, height 5' 4.5" (1.638 m), weight 185 lb 6.4 oz (84.097 kg), last menstrual period 09/18/1987, SpO2 97 %.Body mass index is 31.34 kg/(m^2).  General Appearance: Casual  Eye Contact:  Fair  Speech:  Clear and Coherent  Volume:  Normal  Mood:  Anxious  Affect:  Appropriate  Thought Process:  Logical  Orientation:  Full (Time, Place, and Person)  Thought Content:  WDL  Suicidal Thoughts:  No  Homicidal Thoughts:  No  Memory:  Immediate;   Good  Judgement:  Fair  Insight:  Fair  Psychomotor Activity:  Normal  Concentration:  Fair  Recall:  [employer] of Knowledge: Fair  Language: Fair  Akathisia:  No  Handed:  Right  AIMS (if indicated):  none  Assets:  Communication Skills  ADL's:  Intact  Cognition: WNL  Sleep:  5-6    Is the patient at risk to self?  No. Has the patient been a risk to self in the past 6 months?  No. Has the patient been a risk to self within the distant past?  No. Is the patient a risk to others?  No. Has the patient been a risk to others in the past 6 months?  No.   Has the patient been a risk to others within the distant past?  No.  Current Medications: Current Outpatient Prescriptions  Medication Sig Dispense Refill  . ALPRAZolam (XANAX) 0.5 MG tablet Take 1 tablet (0.5 mg total) by mouth at bedtime as needed for anxiety. 90 tablet 0  . amLODipine (NORVASC) 5 MG tablet TAKE ONE TABLET BY MOUTH EVERY DAY 90 tablet 1  . azelastine (ASTELIN) 0.1 % nasal spray     . Azelastine HCl (ASTEPRO) 0.15 % SOLN 2 sprays by Nasal route 2 (two) times daily as needed. 30 mL 11  . Calcium Carbonate-Vitamin D (CALCIUM 600+D PO) Take by mouth 2 (two) times daily.      . cetirizine (ZYRTEC) 10 MG tablet Take 10 mg by mouth daily.      Marland Kitchen EPINEPHrine 0.3 mg/0.3 mL IJ SOAJ injection as needed.    . Multiple Vitamin (MULTIVITAMIN) tablet Take 1 tablet by mouth daily.      . Omega-3 Fatty  Acids (FISH OIL) 1200 MG CAPS Take by mouth daily.      Marland Kitchen omeprazole (PRILOSEC) 20 MG capsule TAKE 1 CAPSULE TWICE DAILY 180 capsule 0  . omeprazole (PRILOSEC) 40 MG capsule     . ramipril (ALTACE) 10 MG capsule TAKE 1 CAPSULE BY MOUTH EVERY DAY 90 capsule 1  . sertraline (ZOLOFT) 50 MG tablet Take 1.5 tablets daily 135 tablet 1  . triamterene-hydrochlorothiazide (MAXZIDE-25) 37.5-25 MG per tablet TAKE ONE TABLET BY MOUTH EVERY DAY 90 tablet 2   No current  facility-administered medications for this visit.    Medical Decision Making:  Established Problem, Stable/Improving (1)  Treatment Plan Summary:Medication management   Discussed with patient about her medications and  will continue her on the medications as follows Zoloft 75 mg by mouth daily and  Xanax  0.5 milligram by mouth daily She also takes a half pill during daytime  as necessary I will start her on trazodone 50 mg half to 1 pill and she demonstrated understanding    Discussed with her about the adverse effects of the medication and she demonstrated understanding  Follow-up in 1 months  SSRI Discussed with pt about the [facility] Box warnings, increased risk of suicidal thinking when starting the medications.  GI side effects, sexual side effects, increase in manic or hypomanic symptoms as well as the discontinuation syndromes.Advised about withdrawal symptoms if stopped immediately. Pt demonstrated understanding.    BENZODIAZEPINES: Benzodiazepine medications may be habit forming.  If you feel the medication is not working as well, do not take more than the prescribed dose.  Taking too much of a benzodiazepine medication may cause respiratory depression and death.  Always store medication safely and away from children.  Benzodiazepine medications can cause drowsiness.  Avoid driving, operating machinery, or doing anything dangerous if you are not alert.Do not drink alcohol when taking benzodiazepine medications.  This will  increase the sedative effect, and could lead to alcohol toxicity and death.     More than 50% of the time spent in psychoeducation, counseling and coordination of care.    This note was generated in part or whole with voice recognition software. Voice regonition is usually quite accurate but there are transcription errors that can and very often do occur. I apologize for any typographical errors that were not detected and corrected.   Rainey Pines, MD  01/04/2016, 10:55 AM

## 2016-01-15 ENCOUNTER — Telehealth: Payer: Self-pay | Admitting: Family Medicine

## 2016-01-15 DIAGNOSIS — E785 Hyperlipidemia, unspecified: Secondary | ICD-10-CM

## 2016-01-15 DIAGNOSIS — I10 Essential (primary) hypertension: Secondary | ICD-10-CM

## 2016-01-15 DIAGNOSIS — R739 Hyperglycemia, unspecified: Secondary | ICD-10-CM

## 2016-01-15 NOTE — Telephone Encounter (Signed)
-----   Message from Ellamae Sia sent at 01/11/2016  3:46 PM EDT ----- Regarding: lab orders for Thursdday, 5.4.17 Patient is scheduled for CPX labs, please order future labs, Thanks , Karna Christmas

## 2016-01-19 ENCOUNTER — Other Ambulatory Visit (INDEPENDENT_AMBULATORY_CARE_PROVIDER_SITE_OTHER): Payer: Medicare Other

## 2016-01-19 DIAGNOSIS — I10 Essential (primary) hypertension: Secondary | ICD-10-CM

## 2016-01-19 DIAGNOSIS — R739 Hyperglycemia, unspecified: Secondary | ICD-10-CM | POA: Diagnosis not present

## 2016-01-19 DIAGNOSIS — E785 Hyperlipidemia, unspecified: Secondary | ICD-10-CM | POA: Diagnosis not present

## 2016-01-19 LAB — COMPREHENSIVE METABOLIC PANEL
ALK PHOS: 74 U/L (ref 39–117)
ALT: 18 U/L (ref 0–35)
AST: 17 U/L (ref 0–37)
Albumin: 4.2 g/dL (ref 3.5–5.2)
BUN: 23 mg/dL (ref 6–23)
CO2: 27 mEq/L (ref 19–32)
Calcium: 9.5 mg/dL (ref 8.4–10.5)
Chloride: 102 mEq/L (ref 96–112)
Creatinine, Ser: 0.9 mg/dL (ref 0.40–1.20)
GFR: 65.85 mL/min (ref >60.00)
GLUCOSE: 116 mg/dL — AB (ref 70–99)
POTASSIUM: 4.1 meq/L (ref 3.5–5.1)
SODIUM: 137 meq/L (ref 135–145)
TOTAL PROTEIN: 6.7 g/dL (ref 6.0–8.3)
Total Bilirubin: 0.6 mg/dL (ref 0.2–1.2)

## 2016-01-19 LAB — LIPID PANEL
CHOL/HDL RATIO: 8
CHOLESTEROL: 343 mg/dL — AB (ref 0–200)
HDL: 45.4 mg/dL (ref >39.00)
LDL CALC: 275 mg/dL — AB (ref 0–99)
NonHDL: 297.72
TRIGLYCERIDES: 114 mg/dL (ref 0.0–149.0)
VLDL: 22.8 mg/dL (ref 0.0–40.0)

## 2016-01-19 LAB — CBC WITH DIFFERENTIAL/PLATELET
BASOS ABS: 0 10*3/uL (ref 0.0–0.1)
BASOS PCT: 0.4 % (ref 0.0–3.0)
EOS ABS: 0.2 10*3/uL (ref 0.0–0.7)
EOS PCT: 2.2 % (ref 0.0–5.0)
HCT: 41.2 % (ref 36.0–46.0)
Hemoglobin: 13.7 g/dL (ref 12.0–15.0)
LYMPHS ABS: 2.7 10*3/uL (ref 0.7–4.0)
Lymphocytes Relative: 32.5 % (ref 12.0–46.0)
MCHC: 33.3 g/dL (ref 30.0–36.0)
MCV: 85.9 fl (ref 78.0–100.0)
MONO ABS: 0.8 10*3/uL (ref 0.1–1.0)
Monocytes Relative: 9.7 % (ref 3.0–12.0)
NEUTROS PCT: 55.2 % (ref 43.0–77.0)
Neutro Abs: 4.5 10*3/uL (ref 1.4–7.7)
Platelets: 268 10*3/uL (ref 150.0–400.0)
RBC: 4.8 Mil/uL (ref 3.87–5.11)
RDW: 13.8 % (ref 11.5–15.5)
WBC: 8.2 10*3/uL (ref 4.0–10.5)

## 2016-01-19 LAB — HEMOGLOBIN A1C: Hgb A1c MFr Bld: 6.4 % (ref 4.6–6.5)

## 2016-01-19 LAB — TSH: TSH: 5.7 u[IU]/mL — ABNORMAL HIGH (ref 0.35–4.50)

## 2016-01-23 ENCOUNTER — Ambulatory Visit (INDEPENDENT_AMBULATORY_CARE_PROVIDER_SITE_OTHER): Payer: Medicare Other | Admitting: Family Medicine

## 2016-01-23 ENCOUNTER — Encounter: Payer: Self-pay | Admitting: Family Medicine

## 2016-01-23 VITALS — BP 122/80 | HR 70 | Temp 98.3°F | Ht 64.0 in | Wt 184.5 lb

## 2016-01-23 DIAGNOSIS — E669 Obesity, unspecified: Secondary | ICD-10-CM

## 2016-01-23 DIAGNOSIS — I10 Essential (primary) hypertension: Secondary | ICD-10-CM | POA: Diagnosis not present

## 2016-01-23 DIAGNOSIS — E2839 Other primary ovarian failure: Secondary | ICD-10-CM

## 2016-01-23 DIAGNOSIS — R739 Hyperglycemia, unspecified: Secondary | ICD-10-CM | POA: Diagnosis not present

## 2016-01-23 DIAGNOSIS — E785 Hyperlipidemia, unspecified: Secondary | ICD-10-CM | POA: Diagnosis not present

## 2016-01-23 DIAGNOSIS — Z0000 Encounter for general adult medical examination without abnormal findings: Secondary | ICD-10-CM

## 2016-01-23 DIAGNOSIS — E039 Hypothyroidism, unspecified: Secondary | ICD-10-CM | POA: Insufficient documentation

## 2016-01-23 DIAGNOSIS — R7989 Other specified abnormal findings of blood chemistry: Secondary | ICD-10-CM

## 2016-01-23 MED ORDER — AMLODIPINE BESYLATE 5 MG PO TABS: 5.0000 mg | ORAL_TABLET | Freq: Every day | ORAL | Status: DC

## 2016-01-23 MED ORDER — TRIAMTERENE-HCTZ 37.5-25 MG PO TABS
1.0000 | ORAL_TABLET | Freq: Every day | ORAL | Status: DC
Start: 2016-01-23 — End: 2017-03-04

## 2016-01-23 MED ORDER — OMEPRAZOLE 20 MG PO CPDR: 20.0000 mg | DELAYED_RELEASE_CAPSULE | Freq: Two times a day (BID) | ORAL | Status: DC

## 2016-01-23 MED ORDER — RAMIPRIL 10 MG PO CAPS: ORAL_CAPSULE | ORAL | Status: DC

## 2016-01-23 NOTE — Assessment & Plan Note (Signed)
Disc goals for lipids and reasons to control them Rev labs with pt Rev low sat fat diet in detail  This is high and pt intol of all statins Suggested consideration of pcsk9 inhibitor (info given on [facility])-offered ref to cardiology to discuss if open to it  She took the info to review and let me know

## 2016-01-23 NOTE — Assessment & Plan Note (Signed)
Ref for dexa 

## 2016-01-23 NOTE — Assessment & Plan Note (Signed)
Reviewed health habits including diet and exercise and skin cancer prevention Reviewed appropriate screening tests for age  Also reviewed health mt list, fam hx and immunization status , as well as social and family history   See HPI Labs reviewed Will schedule an AMW visit with Katha Cabal Stop at check out to schedule your medicare visit  Also to schedule for bone density test  Schedule lab in 3 months - A1C and thyroid labs  Decrease your omeprazole to 20 mg twice daily - if symptoms worsen please let me know  Here is some info on new type of cholesterol medicine - if you are interested we will set you up for a cardiology visit to discuss (you may be a candidate)

## 2016-01-23 NOTE — Progress Notes (Signed)
Subjective:    Patient ID: [name], female    DOB: 1946/08/10, 70 y.o.   MRN: [id number]  HPI Here for health maintenance exam and to review chronic medical problems    Has been doing well overall  Husband was very sick - he is better now - a big relief  He had endocarditis with abscess on his spine - long course of IV abx and then surgery  Has not had her AMW yet - will schedule that with Lesia   Hep C screen-not high risk/ declines test   Mm 9/16 neg Self exam- no lumps or changes   Td 1/10  Has had a partial hysterectomy No gyn c/o   Colonoscopy 1/13 - 5 year recall (family hx father)- she is aware   dexa nl 06 One fall (tripped on a stair) Fractures-none  Takes calcium and D and she does exercise (bike and weights)  Is open to dexa -wants to do [facility]  Zoster vaccine 9/10 Has also had shingles   Due for prevnar but she had a quite severe local rxn from [facility] 23 - so decided not to get it   bp is stable today  No cp or palpitations or headaches or edema  No side effects to medicines  BP Readings from Last 3 Encounters:  01/23/16 122/80  01/04/16 120/78  12/06/15 120/72     Hyperglycemia Lab Results  Component Value Date   HGBA1C 6.4 01/19/2016   was 6.2  No medications  Diet- not as good for 2 wk due to vacation  Also husb illness-is a stress eater  No sweets  Needs to cut carbs (potatoes)  Wt is down 1 lb 31   GERD She takes omeprazole 40 am and 20 at night  This is from ENT originally  Is open to trying 20 bid  -since she is doing better   Is also weaning off of xanax - with her psychiatrist   Cholesterol Lab Results  Component Value Date   CHOL 343* 01/19/2016   CHOL 338* 01/18/2015   CHOL 328* 01/13/2014   Lab Results  Component Value Date   HDL 45.40 01/19/2016   HDL 42.10 01/18/2015   HDL 43.40 01/13/2014   Lab Results  Component Value Date   LDLCALC 275* 01/19/2016   LDLCALC 269* 01/18/2015   LDLCALC 262* 01/13/2014   Lab  Results  Component Value Date   TRIG 114.0 01/19/2016   TRIG 136.0 01/18/2015   TRIG 112.0 01/13/2014   Lab Results  Component Value Date   CHOLHDL 8 01/19/2016   CHOLHDL 8 01/18/2015   CHOLHDL 8 01/13/2014   Lab Results  Component Value Date   LDLDIRECT 242.8 01/15/2012   LDLDIRECT 237.0 12/18/2010   LDLDIRECT 227.8 09/20/2009  does work on diet  Cannot take any statins   Still on zoloft  This is work     Journalist, newspaper Value Date/Time   NA 137 01/19/2016 1018   K 4.1 01/19/2016 1018   K 4.0 05/07/2014 1139   CL 102 01/19/2016 1018   CO2 27 01/19/2016 1018   BUN 23 01/19/2016 1018   CREATININE 0.90 01/19/2016 1018      Component Value Date/Time   CALCIUM 9.5 01/19/2016 1018   ALKPHOS 74 01/19/2016 1018   AST 17 01/19/2016 1018   ALT 18 01/19/2016 1018   BILITOT 0.6 01/19/2016 1018      tsh is a little high  Will  watch this    Patient Active Problem List   Diagnosis Date Noted  . Routine general medical examination at a health care facility 01/23/2016  . Estrogen deficiency 01/23/2016  . Abnormal TSH 01/23/2016  . Anxiety, generalized 02/21/2015  . H/O: HTN (hypertension) 02/21/2015  . Mild depression 02/21/2015  . H/O diabetes mellitus 01/04/2015  . H/O arthritis 01/04/2015  . History of hay fever 01/04/2015  . Encounter for Medicare annual wellness exam 01/14/2013  . Obesity 01/15/2012  . Stress reaction, emotional 12/26/2010  . MENOPAUSAL SYNDROME 10/04/2008  . Hyperglycemia 12/02/2007  . TREMOR 12/02/2007  . DEPRESSION 08/04/2007  . Hyperlipidemia 01/28/2007  . ANXIETY 01/28/2007  . Essential hypertension 01/28/2007  . GERD 01/28/2007  . URINARY INCONTINENCE 01/28/2007  . COLONIC POLYPS, HX OF 01/28/2007  . HERPES ZOSTER 01/20/2007  . DYSPHONIA 01/20/2007  . COUGH, CHRONIC 01/20/2007  . ALLERGY 01/20/2007   Past Medical History  Diagnosis Date  . Anxiety   . Panic disorder   . Depression   . Voice disorder   . Hx of  colonic polyp   . GERD (gastroesophageal reflux disease)   . Hyperlipemia     Very high chol intol of statins-does not want to check it  . Hypertension   . Hyperglycemia   . Tremor     from anx  . Urinary incontinence     mixed  . Allergic rhinitis     gets allergy shot from Dr. Carlis Abbott  . History of shingles     at age 31- mild   Past Surgical History  Procedure Laterality Date  . Partial hysterectomy      with fibroid  . Nasal sinus surgery    . Bladder suspension    . Rectocele repair    . Abdominal hysterectomy    . Medial partial knee replacement Left 2016  . Joint replacement      knee replacement - march 15   Social History  Substance Use Topics  . Smoking status: Never Smoker   . Smokeless tobacco: Never Used  . Alcohol Use: No   Family History  Problem Relation Age of Onset  . Diabetes Mother   . Hyperlipidemia Mother   . Hypertension Mother   . Osteoarthritis Mother   . Cancer Father     Colon  . Syncope episode Daughter   . Leukemia Sister    Allergies  Allergen Reactions  . Atorvastatin     REACTION: shoulder pain  . Ezetimibe     REACTION: ?  . Lisinopril     REACTION: muscle pain  . Molds & Smuts   . Penicillins     REACTION: reaction not known  . Pneumococcal Vaccine Swelling    REACTION: severe local reaction  . Pneumococcal Vaccine Polyvalent     REACTION: severe local reaction  . Pollen Extract   . Rosuvastatin     REACTION: leg pain  . Simvastatin     REACTION: muscle pain   Current Outpatient Prescriptions on File Prior to Visit  Medication Sig Dispense Refill  . ALPRAZolam (XANAX) 0.5 MG tablet Take 1 tablet (0.5 mg total) by mouth at bedtime as needed for anxiety. (Patient taking differently: Take 12.5 mg by mouth 2 (two) times daily. ) 90 tablet 0  . Azelastine HCl (ASTEPRO) 0.15 % SOLN 2 sprays by Nasal route 2 (two) times daily as needed. 30 mL 11  . Calcium Carbonate-Vitamin D (CALCIUM 600+D PO) Take by mouth 2 (two) times  daily.      Marland Kitchen  cetirizine (ZYRTEC) 10 MG tablet Take 10 mg by mouth daily.      Marland Kitchen EPINEPHrine 0.3 mg/0.3 mL IJ SOAJ injection as needed.    . Multiple Vitamin (MULTIVITAMIN) tablet Take 1 tablet by mouth daily.      . Omega-3 Fatty Acids (FISH OIL) 1200 MG CAPS Take by mouth daily.      . sertraline (ZOLOFT) 50 MG tablet Take 1.5 tablets daily 135 tablet 1  . traZODone (DESYREL) 50 MG tablet Take 1 tablet (50 mg total) by mouth at bedtime. Take 1/2 -1 pill at bed time as needed. 30 tablet 0   No current facility-administered medications on file prior to visit.     Review of Systems Review of Systems  Constitutional: Negative for fever, appetite change, fatigue and unexpected weight change.  Eyes: Negative for pain and visual disturbance.  Respiratory: Negative for cough and shortness of breath.   Cardiovascular: Negative for cp or palpitations    Gastrointestinal: Negative for nausea, diarrhea and constipation.  Genitourinary: Negative for urgency and frequency.  Skin: Negative for pallor or rash   Neurological: Negative for weakness, light-headedness, numbness and headaches.  Hematological: Negative for adenopathy. Does not bruise/bleed easily.  Psychiatric/Behavioral: Negative for dysphoric mood. Anxiety is currently controlled        Objective:   Physical Exam  Constitutional: She appears well-developed and well-nourished. No distress.  obese and well appearing   HENT:  Head: Normocephalic and atraumatic.  Right Ear: External ear normal.  Left Ear: External ear normal.  Mouth/Throat: Oropharynx is clear and moist.  Mild dysphonia baseline  Eyes: Conjunctivae and EOM are normal. Pupils are equal, round, and reactive to light. No scleral icterus.  Neck: Normal range of motion. Neck supple. No JVD present. Carotid bruit is not present. No thyromegaly present.  Cardiovascular: Normal rate, regular rhythm, normal heart sounds and intact distal pulses.  Exam reveals no gallop.     Pulmonary/Chest: Effort normal and breath sounds normal. No respiratory distress. She has no wheezes. She exhibits no tenderness.  Abdominal: Soft. Bowel sounds are normal. She exhibits no distension, no abdominal bruit and no mass. There is no tenderness.  Genitourinary: No breast swelling, tenderness, discharge or bleeding.  Breast exam: No mass, nodules, thickening, tenderness, bulging, retraction, inflamation, nipple discharge or skin changes noted.  No axillary or clavicular LA.      Musculoskeletal: Normal range of motion. She exhibits no edema or tenderness.  Lymphadenopathy:    She has no cervical adenopathy.  Neurological: She is alert. She has normal reflexes. No cranial nerve deficit. She exhibits normal muscle tone. Coordination normal.  Mild hand tremor  Skin: Skin is warm and dry. No rash noted. No erythema. No pallor.  Some sks and lentigines   Psychiatric: She has a normal mood and affect.          Assessment & Plan:   Problem List Items Addressed This Visit      Cardiovascular and Mediastinum   Essential hypertension - Primary    bp in fair control at this time  BP Readings from Last 1 Encounters:  01/23/16 122/80   No changes needed Disc lifstyle change with low sodium diet and exercise  Labs reviewed       Relevant Medications   triamterene-hydrochlorothiazide (MAXZIDE-25) 37.5-25 MG tablet   ramipril (ALTACE) 10 MG capsule   amLODipine (NORVASC) 5 MG tablet     Other   Abnormal TSH    Will re check this at next  labs with free T4 Is borderline  No clinical changes       Estrogen deficiency    Ref for dexa       Relevant Orders   DG Bone Density   Hyperglycemia    Lab Results  Component Value Date   HGBA1C 6.4 01/19/2016   This is up slightly Disc imp of low glycemic diet and wt loss to prevent DM2       Hyperlipidemia    Disc goals for lipids and reasons to control them Rev labs with pt Rev low sat fat diet in detail  This is high  and pt intol of all statins Suggested consideration of pcsk9 inhibitor (info given on [facility])-offered ref to cardiology to discuss if open to it  She took the info to review and let me know        Relevant Medications   triamterene-hydrochlorothiazide (MAXZIDE-25) 37.5-25 MG tablet   ramipril (ALTACE) 10 MG capsule   amLODipine (NORVASC) 5 MG tablet   Obesity    Discussed how this problem influences overall health and the risks it imposes  Reviewed plan for weight loss with lower calorie diet (via better food choices and also portion control or program like weight watchers) and exercise building up to or more than 30 minutes 5 days per week including some aerobic activity         Routine general medical examination at a health care facility    Reviewed health habits including diet and exercise and skin cancer prevention Reviewed appropriate screening tests for age  Also reviewed health mt list, fam hx and immunization status , as well as social and family history   See HPI Labs reviewed Will schedule an AMW visit with Katha Cabal Stop at check out to schedule your medicare visit  Also to schedule for bone density test  Schedule lab in 3 months - A1C and thyroid labs  Decrease your omeprazole to 20 mg twice daily - if symptoms worsen please let me know  Here is some info on new type of cholesterol medicine - if you are interested we will set you up for a cardiology visit to discuss (you may be a candidate)

## 2016-01-23 NOTE — Assessment & Plan Note (Signed)
Lab Results  Component Value Date   HGBA1C 6.4 01/19/2016   This is up slightly Disc imp of low glycemic diet and wt loss to prevent DM2

## 2016-01-23 NOTE — Assessment & Plan Note (Signed)
bp in fair control at this time  BP Readings from Last 1 Encounters:  01/23/16 122/80   No changes needed Disc lifstyle change with low sodium diet and exercise  Labs reviewed

## 2016-01-23 NOTE — Assessment & Plan Note (Signed)
Will re check this at next labs with free T4 Is borderline  No clinical changes

## 2016-01-23 NOTE — Progress Notes (Signed)
Pre visit review using our clinic review tool, if applicable. No additional management support is needed unless otherwise documented below in the visit note. 

## 2016-01-23 NOTE — Assessment & Plan Note (Signed)
Discussed how this problem influences overall health and the risks it imposes  Reviewed plan for weight loss with lower calorie diet (via better food choices and also portion control or program like weight watchers) and exercise building up to or more than 30 minutes 5 days per week including some aerobic activity    

## 2016-01-23 NOTE — Patient Instructions (Signed)
Stop at check out to schedule your medicare visit  Also to schedule for bone density test  Schedule lab in 3 months - A1C and thyroid labs  Decrease your omeprazole to 20 mg twice daily - if symptoms worsen please let me know  Here is some info on new type of cholesterol medicine - if you are interested we will set you up for a cardiology visit to discuss (you may be a candidate)

## 2016-01-31 ENCOUNTER — Ambulatory Visit (INDEPENDENT_AMBULATORY_CARE_PROVIDER_SITE_OTHER): Payer: 59 | Admitting: Psychiatry

## 2016-01-31 ENCOUNTER — Encounter: Payer: Self-pay | Admitting: Psychiatry

## 2016-01-31 VITALS — BP 138/76 | HR 88 | Temp 97.1°F | Ht 64.0 in | Wt 187.8 lb

## 2016-01-31 DIAGNOSIS — F411 Generalized anxiety disorder: Secondary | ICD-10-CM | POA: Diagnosis not present

## 2016-01-31 DIAGNOSIS — F330 Major depressive disorder, recurrent, mild: Secondary | ICD-10-CM

## 2016-01-31 NOTE — Progress Notes (Signed)
BH MD/PA/NP OP Progress Note  01/31/2016 10:43 AM [name]  MRN:  [id number]  Subjective:  Patient is a 70 year old female who presented for the follow-up appointment. She reported that she is doing better and has started decreasing the dose of Xanax. She reported that she is only taking 12.5 mg twice a day. Her memory has started improving and she is more alert. Patient reported that she is not having any worsening of her anxiety symptoms. Patient reported that she wants to continue decreasing the dose of Xanax at this time. She has taken trazodone on a when necessary basis. Patient currently denied having any adverse effects of the medication. She appeared more alert happy during the interview. We discussed about her symptoms and her vocal cord issues which have been present since she was 70 years old in detail.  She  currently denied having any suicidal homicidal ideations or plans at this time.   Chief Complaint:  Chief Complaint    Follow-up; Medication Refill     Visit Diagnosis:     ICD-9-CM ICD-10-CM   1. Mild episode of recurrent major depressive disorder ([place]) 296.31 F33.0   2. Generalized anxiety disorder 300.02 F41.1     Past Medical History:  Past Medical History  Diagnosis Date  . Anxiety   . Panic disorder   . Depression   . Voice disorder   . Hx of colonic polyp   . GERD (gastroesophageal reflux disease)   . Hyperlipemia     Very high chol intol of statins-does not want to check it  . Hypertension   . Hyperglycemia   . Tremor     from anx  . Urinary incontinence     mixed  . Allergic rhinitis     gets allergy shot from Dr. Carlis Abbott  . History of shingles     at age 11- mild    Past Surgical History  Procedure Laterality Date  . Partial hysterectomy      with fibroid  . Nasal sinus surgery    . Bladder suspension    . Rectocele repair    . Abdominal hysterectomy    . Medial partial knee replacement Left 2016  . Joint replacement      knee  replacement - march 15   Family History:  Family History  Problem Relation Age of Onset  . Diabetes Mother   . Hyperlipidemia Mother   . Hypertension Mother   . Osteoarthritis Mother   . Cancer Father     Colon  . Syncope episode Daughter   . Leukemia Sister    Social History:  Social History   Social History  . Marital Status: Married    Spouse Name: N/A  . Number of Children: 2  . Years of Education: N/A   Occupational History  . 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 Smoking status: Never Smoker   . Smokeless tobacco: Never Used  . Alcohol Use: No  . Drug Use: No  . Sexual Activity: No   Other Topics Concern  . None   Social History Narrative   Additional History:  Lives with husband. She stated that she spends time by exercise and doing hosehold chores. She stated that she is having issues due to knee replacement now.   Assessment:   Musculoskeletal: Strength & Muscle Tone: within normal limits Gait & Station: normal Patient leans: N/A  Psychiatric Specialty Exam: HPI  Review  of Systems  Constitutional: Negative for weight loss.  HENT: Negative for hearing loss.   Eyes: Negative for photophobia.  Respiratory: Negative for hemoptysis.   Cardiovascular: Negative for palpitations.  Gastrointestinal: Negative for vomiting.  Genitourinary: Negative for urgency.  Musculoskeletal: Positive for myalgias, back pain and joint pain.  Neurological: Negative for tremors.  Endo/Heme/Allergies: Negative for environmental allergies.  Psychiatric/Behavioral: Positive for depression. Negative for hallucinations. The patient is nervous/anxious and has insomnia.   All other systems reviewed and are negative.   Blood pressure 138/76, pulse 88, temperature 97.1 F (36.2 C), temperature source Tympanic, height 5\' 4"  (1.626 m), weight 187 lb 12.8 oz (85.186 kg), last menstrual period 09/18/1987, SpO2 97 %.Body mass index is 32.22  kg/(m^2).  General Appearance: Casual  Eye Contact:  Fair  Speech:  Clear and Coherent  Volume:  Normal  Mood:  Anxious  Affect:  Appropriate  Thought Process:  Logical  Orientation:  Full (Time, Place, and Person)  Thought Content:  WDL  Suicidal Thoughts:  No  Homicidal Thoughts:  No  Memory:  Immediate;   Good  Judgement:  Fair  Insight:  Fair  Psychomotor Activity:  Normal  Concentration:  Fair  Recall:  [employer] of Knowledge: Fair  Language: Fair  Akathisia:  No  Handed:  Right  AIMS (if indicated):  none  Assets:  Communication Skills  ADL's:  Intact  Cognition: WNL  Sleep:  5-6    Is the patient at risk to self?  No. Has the patient been a risk to self in the past 6 months?  No. Has the patient been a risk to self within the distant past?  No. Is the patient a risk to others?  No. Has the patient been a risk to others in the past 6 months?  No.   Has the patient been a risk to others within the distant past?  No.  Current Medications: Current Outpatient Prescriptions  Medication Sig Dispense Refill  . ALPRAZolam (XANAX) 0.5 MG tablet Take 1 tablet (0.5 mg total) by mouth at bedtime as needed for anxiety. (Patient taking differently: Take 12.5 mg by mouth 2 (two) times daily. ) 90 tablet 0  . amLODipine (NORVASC) 5 MG tablet Take 1 tablet (5 mg total) by mouth daily. 90 tablet 3  . Azelastine HCl (ASTEPRO) 0.15 % SOLN 2 sprays by Nasal route 2 (two) times daily as needed. 30 mL 11  . Calcium Carbonate-Vitamin D (CALCIUM 600+D PO) Take by mouth 2 (two) times daily.      . cetirizine (ZYRTEC) 10 MG tablet Take 10 mg by mouth daily.      Marland Kitchen EPINEPHrine 0.3 mg/0.3 mL IJ SOAJ injection as needed.    . Multiple Vitamin (MULTIVITAMIN) tablet Take 1 tablet by mouth daily.      . Omega-3 Fatty Acids (FISH OIL) 1200 MG CAPS Take by mouth daily.      Marland Kitchen omeprazole (PRILOSEC) 20 MG capsule Take 1 capsule (20 mg total) by mouth 2 (two) times daily. 180 capsule 3  . ramipril  (ALTACE) 10 MG capsule TAKE 1 CAPSULE BY MOUTH EVERY DAY 90 capsule 3  . sertraline (ZOLOFT) 50 MG tablet Take 1.5 tablets daily 135 tablet 1  . traZODone (DESYREL) 50 MG tablet Take 1 tablet (50 mg total) by mouth at bedtime. Take 1/2 -1 pill at bed time as needed. 30 tablet 0  . triamterene-hydrochlorothiazide (MAXZIDE-25) 37.5-25 MG tablet Take 1 tablet by mouth daily. 90 tablet 3  No current facility-administered medications for this visit.    Medical Decision Making:  Established Problem, Stable/Improving (1)  Treatment Plan Summary:Medication management   Discussed with patient about her medications and  will continue her on the medications as follows Zoloft 75 mg by mouth daily and  Xanax  0.25 milligram by mouth daily  I will start her on trazodone 50 mg half to 1 pill and she demonstrated understanding    Discussed with her about the adverse effects of the medication and she demonstrated understanding  Follow-up in 1 months  SSRI Discussed with pt about the [facility] Box warnings, increased risk of suicidal thinking when starting the medications.  GI side effects, sexual side effects, increase in manic or hypomanic symptoms as well as the discontinuation syndromes.Advised about withdrawal symptoms if stopped immediately. Pt demonstrated understanding.    BENZODIAZEPINES: Benzodiazepine medications may be habit forming.  If you feel the medication is not working as well, do not take more than the prescribed dose.  Taking too much of a benzodiazepine medication may cause respiratory depression and death.  Always store medication safely and away from children.  Benzodiazepine medications can cause drowsiness.  Avoid driving, operating machinery, or doing anything dangerous if you are not alert.Do not drink alcohol when taking benzodiazepine medications.  This will increase the sedative effect, and could lead to alcohol toxicity and death.     More than 50% of the time spent in  psychoeducation, counseling and coordination of care.    This note was generated in part or whole with voice recognition software. Voice regonition is usually quite accurate but there are transcription errors that can and very often do occur. I apologize for any typographical errors that were not detected and corrected.   Rainey Pines, MD  01/31/2016, 10:43 AM

## 2016-02-07 ENCOUNTER — Ambulatory Visit
Admission: RE | Admit: 2016-02-07 | Discharge: 2016-02-07 | Disposition: A | Discharge status: Home / Self Care | Payer: Medicare Other | Source: Ambulatory Visit | Attending: Family Medicine | Admitting: Family Medicine

## 2016-02-07 DIAGNOSIS — M85852 Other specified disorders of bone density and structure, left thigh: Secondary | ICD-10-CM | POA: Insufficient documentation

## 2016-02-07 DIAGNOSIS — E2839 Other primary ovarian failure: Secondary | ICD-10-CM | POA: Diagnosis present

## 2016-02-13 ENCOUNTER — Encounter: Payer: Self-pay | Admitting: Family Medicine

## 2016-02-13 DIAGNOSIS — M8580 Other specified disorders of bone density and structure, unspecified site: Secondary | ICD-10-CM | POA: Insufficient documentation

## 2016-02-28 ENCOUNTER — Encounter: Payer: Self-pay | Admitting: Psychiatry

## 2016-02-28 ENCOUNTER — Ambulatory Visit (INDEPENDENT_AMBULATORY_CARE_PROVIDER_SITE_OTHER): Payer: 59 | Admitting: Psychiatry

## 2016-02-28 VITALS — BP 127/75 | HR 82 | Temp 97.7°F | Ht 64.0 in | Wt 185.2 lb

## 2016-02-28 DIAGNOSIS — F330 Major depressive disorder, recurrent, mild: Secondary | ICD-10-CM

## 2016-02-28 MED ORDER — ALPRAZOLAM 0.25 MG PO TABS: 0.2500 mg | ORAL_TABLET | Freq: Every evening | ORAL | Status: DC | PRN

## 2016-02-28 MED ORDER — SERTRALINE HCL 50 MG PO TABS: ORAL_TABLET | ORAL | Status: DC

## 2016-02-28 MED ORDER — ZALEPLON 5 MG PO CAPS: 5.0000 mg | ORAL_CAPSULE | Freq: Every evening | ORAL | Status: DC | PRN

## 2016-02-28 NOTE — Progress Notes (Signed)
BH MD/PA/NP OP Progress Note  02/28/2016 10:03 AM DANITRA SWAREY  MRN:  [id number]  Subjective:  Patient is a 70 year old female who presented for the follow-up appointment. She reported that she is sleeping well since the medications have been adjusted. She is trying to taper the doses of the Xanax. She reported that she wants to get off the Xanax completely. Patient reported that she went to the concert yesterday and enjoyed it in the [place] in [place]. Patient reported that her anxiety is getting better. She does not have any more swings anger or paranoia. She appeared calm and collected during the interview. We discussed about the medication changes at length and she is willing to try a sleeping aid at this time. Patient reported that she feels that her depression is getting better as well. She denied having any suicidal homicidal ideations or plans.  Her memory is improving. She reported that she will try taking Zoloft at 6 PM as it might be interfering with her sleep pattern is at this time.She  currently denied having any suicidal homicidal ideations or plans at this time.   Chief Complaint:  Chief Complaint    Follow-up; Medication Refill     Visit Diagnosis:     ICD-9-CM ICD-10-CM   1. Mild episode of recurrent major depressive disorder ([place]) 296.31 F33.0     Past Medical History:  Past Medical History  Diagnosis Date  . Anxiety   . Panic disorder   . Depression   . Voice disorder   . Hx of colonic polyp   . GERD (gastroesophageal reflux disease)   . Hyperlipemia     Very high chol intol of statins-does not want to check it  . Hypertension   . Hyperglycemia   . Tremor     from anx  . Urinary incontinence     mixed  . Allergic rhinitis     gets allergy shot from Dr. Carlis Abbott  . History of shingles     at age 72- mild    Past Surgical History  Procedure Laterality Date  . Partial hysterectomy      with fibroid  . Nasal sinus surgery    . Bladder suspension    .  Rectocele repair    . Abdominal hysterectomy    . Medial partial knee replacement Left 2016  . Joint replacement      knee replacement - march 15   Family History:  Family History  Problem Relation Age of Onset  . Diabetes Mother   . Hyperlipidemia Mother   . Hypertension Mother   . Osteoarthritis Mother   . Cancer Father     Colon  . Syncope episode Daughter   . Leukemia Sister    Social History:  Social History   Social History  . Marital Status: Married    Spouse Name: N/A  . Number of Children: 2  . Years of Education: N/A   Occupational History  . 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 Smoking status: Never Smoker   . Smokeless tobacco: Never Used  . Alcohol Use: No  . Drug Use: No  . Sexual Activity: No   Other Topics Concern  . None   Social History Narrative   Additional History:  Lives with husband. She stated that she spends time by exercise and doing hosehold chores. She stated that she is having issues due to knee replacement now.   Assessment:  Musculoskeletal: Strength & Muscle Tone: within normal limits Gait & Station: normal Patient leans: N/A  Psychiatric Specialty Exam: HPI  Review of Systems  Constitutional: Negative for weight loss.  HENT: Negative for hearing loss.   Eyes: Negative for photophobia.  Respiratory: Negative for hemoptysis.   Cardiovascular: Negative for palpitations.  Gastrointestinal: Negative for vomiting.  Genitourinary: Negative for urgency.  Musculoskeletal: Positive for myalgias, back pain and joint pain.  Neurological: Negative for tremors.  Endo/Heme/Allergies: Negative for environmental allergies.  Psychiatric/Behavioral: Positive for depression. Negative for hallucinations. The patient is nervous/anxious and has insomnia.   All other systems reviewed and are negative.   Blood pressure 127/75, pulse 82, temperature 97.7 F (36.5 C), temperature source Tympanic,  height 5\' 4"  (1.626 m), weight 185 lb 3.2 oz (84.006 kg), last menstrual period 09/18/1987, SpO2 94 %.Body mass index is 31.77 kg/(m^2).  General Appearance: Casual  Eye Contact:  Fair  Speech:  Clear and Coherent  Volume:  Normal  Mood:  Anxious  Affect:  Appropriate  Thought Process:  Logical  Orientation:  Full (Time, Place, and Person)  Thought Content:  WDL  Suicidal Thoughts:  No  Homicidal Thoughts:  No  Memory:  Immediate;   Good  Judgement:  Fair  Insight:  Fair  Psychomotor Activity:  Normal  Concentration:  Fair  Recall:  [employer] of Knowledge: Fair  Language: Fair  Akathisia:  No  Handed:  Right  AIMS (if indicated):  none  Assets:  Communication Skills  ADL's:  Intact  Cognition: WNL  Sleep:  5-6    Is the patient at risk to self?  No. Has the patient been a risk to self in the past 6 months?  No. Has the patient been a risk to self within the distant past?  No. Is the patient a risk to others?  No. Has the patient been a risk to others in the past 6 months?  No.   Has the patient been a risk to others within the distant past?  No.  Current Medications: Current Outpatient Prescriptions  Medication Sig Dispense Refill  . ALPRAZolam (XANAX) 0.5 MG tablet Take 1 tablet (0.5 mg total) by mouth at bedtime as needed for anxiety. (Patient taking differently: Take 12.5 mg by mouth 2 (two) times daily. ) 90 tablet 0  . amLODipine (NORVASC) 5 MG tablet Take 1 tablet (5 mg total) by mouth daily. 90 tablet 3  . Azelastine HCl (ASTEPRO) 0.15 % SOLN 2 sprays by Nasal route 2 (two) times daily as needed. 30 mL 11  . Calcium Carbonate-Vitamin D (CALCIUM 600+D PO) Take by mouth 2 (two) times daily.      . cetirizine (ZYRTEC) 10 MG tablet Take 10 mg by mouth daily.      Marland Kitchen EPINEPHrine 0.3 mg/0.3 mL IJ SOAJ injection as needed.    . Multiple Vitamin (MULTIVITAMIN) tablet Take 1 tablet by mouth daily.      . Omega-3 Fatty Acids (FISH OIL) 1200 MG CAPS Take by mouth daily.       Marland Kitchen omeprazole (PRILOSEC) 20 MG capsule Take 1 capsule (20 mg total) by mouth 2 (two) times daily. 180 capsule 3  . ramipril (ALTACE) 10 MG capsule TAKE 1 CAPSULE BY MOUTH EVERY DAY 90 capsule 3  . sertraline (ZOLOFT) 50 MG tablet Take 1.5 tablets daily 135 tablet 1  . traZODone (DESYREL) 50 MG tablet Take 1 tablet (50 mg total) by mouth at bedtime. Take 1/2 -1 pill at bed time  as needed. 30 tablet 0  . triamterene-hydrochlorothiazide (MAXZIDE-25) 37.5-25 MG tablet Take 1 tablet by mouth daily. 90 tablet 3   No current facility-administered medications for this visit.    Medical Decision Making:  Established Problem, Stable/Improving (1)  Treatment Plan Summary:Medication management   Discussed with patient about her medications and   She will continue her on the medications as follows Zoloft 75 mg by mouth daily  Prescribed Xanax  0.25 milligram by mouth daily when necessary basis  I will start her on the 5 mg by mouth daily at bedtime when necessary for sleep and discussed with her about the side effects of medication in detail and she demonstrated understanding    Follow-up in 1 months  SSRI Discussed with pt about the [facility] Box warnings, increased risk of suicidal thinking when starting the medications.  GI side effects, sexual side effects, increase in manic or hypomanic symptoms as well as the discontinuation syndromes.Advised about withdrawal symptoms if stopped immediately. Pt demonstrated understanding.    BENZODIAZEPINES: Benzodiazepine medications may be habit forming.  If you feel the medication is not working as well, do not take more than the prescribed dose.  Taking too much of a benzodiazepine medication may cause respiratory depression and death.  Always store medication safely and away from children.  Benzodiazepine medications can cause drowsiness.  Avoid driving, operating machinery, or doing anything dangerous if you are not alert.Do not drink alcohol when taking  benzodiazepine medications.  This will increase the sedative effect, and could lead to alcohol toxicity and death.     More than 50% of the time spent in psychoeducation, counseling and coordination of care.    This note was generated in part or whole with voice recognition software. Voice regonition is usually quite accurate but there are transcription errors that can and very often do occur. I apologize for any typographical errors that were not detected and corrected.   Rainey Pines, MD  02/28/2016, 10:03 AM

## 2016-02-29 ENCOUNTER — Telehealth: Payer: Self-pay

## 2016-02-29 NOTE — Telephone Encounter (Signed)
spoke with Dr. Gretel Acre about the denial on zaleplon per dr. Gretel Acre have patient try some samples of belsomra 15mg  . will give patient some samples of belsomra 15mg  lot # [id number] exp 10-17

## 2016-02-29 NOTE — Telephone Encounter (Signed)
spoke with mrs. Gessel and she stated that was fine she will be up today to get medication samples.

## 2016-02-29 NOTE — Telephone Encounter (Signed)
received fax that stated that the zaleplon was denied. pt needs to have try and failed belsomra or rozerem

## 2016-03-07 ENCOUNTER — Ambulatory Visit (INDEPENDENT_AMBULATORY_CARE_PROVIDER_SITE_OTHER): Payer: Medicare Other

## 2016-03-07 VITALS — BP 110/70 | HR 76 | Temp 98.5°F | Ht 64.0 in | Wt 183.5 lb

## 2016-03-07 DIAGNOSIS — Z0000 Encounter for general adult medical examination without abnormal findings: Secondary | ICD-10-CM | POA: Diagnosis not present

## 2016-03-07 DIAGNOSIS — Z1159 Encounter for screening for other viral diseases: Secondary | ICD-10-CM

## 2016-03-07 NOTE — Patient Instructions (Signed)
[name] , Thank you for taking time to come for your Medicare Wellness Visit. I appreciate your ongoing commitment to your health goals. Please review the following plan we discussed and let me know if I can assist you in the future.   These are the goals we discussed: Goals    . Increase physical activity     Starting 03/07/2016, I will continue to exercise for at least 30 min 5-6 days per week.        This is a list of the screening recommended for you and due dates:  Health Maintenance  Topic Date Due  .  Hepatitis C: One time screening is recommended by Center for Disease Control  (CDC) for  adults born from [birth year] through 1965.   04/26/2016*  . Pneumonia vaccines (1 of 2 - PCV13) 10/19/2020*  . Flu Shot  04/17/2016  . Mammogram  06/02/2016  . Tetanus Vaccine  10/04/2018  . Colon Cancer Screening  09/23/2021  . DEXA scan (bone density measurement)  Completed  . Shingles Vaccine  Completed  *Topic was postponed. The date shown is not the original due date.    Preventive Care for Adults  A healthy lifestyle and preventive care can promote health and wellness. Preventive health guidelines for adults include the following key practices.  . A routine yearly physical is a good way to check with your health care provider about your health and preventive screening. It is a chance to share any concerns and updates on your health and to receive a thorough exam.  . Visit your dentist for a routine exam and preventive care every 6 months. Brush your teeth twice a day and floss once a day. Good oral hygiene prevents tooth decay and gum disease.  . The frequency of eye exams is based on your age, health, family medical history, use  of contact lenses, and other factors. Follow your health care provider's ecommendations for frequency of eye exams.  . Eat a healthy diet. Foods like vegetables, fruits, whole grains, low-fat dairy products, and lean protein foods contain the nutrients you need  without too many calories. Decrease your intake of foods high in solid fats, added sugars, and salt. Eat the right amount of calories for you. Get information about a proper diet from your health care provider, if necessary.  . Regular physical exercise is one of the most important things you can do for your health. Most adults should get at least 150 minutes of moderate-intensity exercise (any activity that increases your heart rate and causes you to sweat) each week. In addition, most adults need muscle-strengthening exercises on 2 or more days a week.  Silver Sneakers may be a benefit available to you. To determine eligibility, you may visit the website: www.silversneakers.com or contact program at 737-826-9761 Mon-Fri between 8AM-8PM.   . Maintain a healthy weight. The body mass index (BMI) is a screening tool to identify possible weight problems. It provides an estimate of body fat based on height and weight. Your health care provider can find your BMI and can help you achieve or maintain a healthy weight.   For adults 20 years and older: ? A BMI below 18.5 is considered underweight. ? A BMI of 18.5 to 24.9 is normal. ? A BMI of 25 to 29.9 is considered overweight. ? A BMI of 30 and above is considered obese.   . Maintain normal blood lipids and cholesterol levels by exercising and minimizing your intake of saturated fat. Eat  a balanced diet with plenty of fruit and vegetables. Blood tests for lipids and cholesterol should begin at age 28 and be repeated every 5 years. If your lipid or cholesterol levels are high, you are over 50, or you are at high risk for heart disease, you may need your cholesterol levels checked more frequently. Ongoing high lipid and cholesterol levels should be treated with medicines if diet and exercise are not working.  . If you smoke, find out from your health care provider how to quit. If you do not use tobacco, please do not start.  . If you choose to drink  alcohol, please do not consume more than 2 drinks per day. One drink is considered to be 12 ounces (355 mL) of beer, 5 ounces (148 mL) of wine, or 1.5 ounces (44 mL) of liquor.  . If you are 63-2 years old, ask your health care provider if you should take aspirin to prevent strokes.  . Use sunscreen. Apply sunscreen liberally and repeatedly throughout the day. You should seek shade when your shadow is shorter than you. Protect yourself by wearing long sleeves, pants, a wide-brimmed hat, and sunglasses year round, whenever you are outdoors.  . Once a month, do a whole body skin exam, using a mirror to look at the skin on your back. Tell your health care provider of new moles, moles that have irregular borders, moles that are larger than a pencil eraser, or moles that have changed in shape or color.

## 2016-03-07 NOTE — Progress Notes (Signed)
PCP notes:  Health maintenance:   Hep C screening - will be completed with future labs  Abnormal screenings:   Fall risk - hx of fall without injury in previous 12 mths  Patient concerns: None  Nurse concerns: None  Next PCP appt: 04/26/2016  I reviewed health advisor's note, was available for consultation, and agree with documentation and plan. Loura Pardon MD

## 2016-03-07 NOTE — Progress Notes (Signed)
Subjective:   [name] is a 70 y.o. female who presents for Medicare Annual (Subsequent) preventive examination.  Review of Systems:  N/A Cardiac Risk Factors include: advanced age (>22men, >84 women);dyslipidemia;hypertension;obesity (BMI >30kg/m2)     Objective:     Vitals: BP 110/70 mmHg  Pulse 76  Temp(Src) 98.5 F (36.9 C) (Oral)  Ht 5\' 4"  (1.626 m)  Wt 183 lb 8 oz (83.235 kg)  BMI 31.48 kg/m2  SpO2 96%  LMP 09/18/1987  Body mass index is 31.48 kg/(m^2).   Tobacco History  Smoking status  . Never Smoker   Smokeless tobacco  . Never Used     Counseling given: No   Past Medical History  Diagnosis Date  . Anxiety   . Panic disorder   . Depression   . Voice disorder   . Hx of colonic polyp   . GERD (gastroesophageal reflux disease)   . Hyperlipemia     Very high chol intol of statins-does not want to check it  . Hypertension   . Hyperglycemia   . Tremor     from anx  . Urinary incontinence     mixed  . Allergic rhinitis     gets allergy shot from Dr. Carlis Abbott  . History of shingles     at age 18- mild   Past Surgical History  Procedure Laterality Date  . Partial hysterectomy      with fibroid  . Nasal sinus surgery    . Bladder suspension    . Rectocele repair    . Abdominal hysterectomy    . Medial partial knee replacement Left 2016  . Joint replacement      knee replacement - march 15   Family History  Problem Relation Age of Onset  . Diabetes Mother   . Hyperlipidemia Mother   . Hypertension Mother   . Osteoarthritis Mother   . Cancer Father     Colon  . Syncope episode Daughter   . Leukemia Sister    History  Sexual Activity  . Sexual Activity: No    Outpatient Encounter Prescriptions as of 03/07/2016  Medication Sig  . amLODipine (NORVASC) 5 MG tablet Take 1 tablet (5 mg total) by mouth daily.  . Azelastine HCl (ASTEPRO) 0.15 % SOLN 2 sprays by Nasal route 2 (two) times daily as needed.  . Calcium Carbonate-Vitamin D  (CALCIUM 600+D PO) Take by mouth 2 (two) times daily.    . cetirizine (ZYRTEC) 10 MG tablet Take 10 mg by mouth daily.    Marland Kitchen EPINEPHrine 0.3 mg/0.3 mL IJ SOAJ injection as needed.  . Multiple Vitamin (MULTIVITAMIN) tablet Take 1 tablet by mouth daily.    . Omega-3 Fatty Acids (FISH OIL) 1200 MG CAPS Take by mouth daily.    Marland Kitchen omeprazole (PRILOSEC) 20 MG capsule Take 1 capsule (20 mg total) by mouth 2 (two) times daily.  . ramipril (ALTACE) 10 MG capsule TAKE 1 CAPSULE BY MOUTH EVERY DAY  . sertraline (ZOLOFT) 50 MG tablet Take 1.5 tablets daily  . triamterene-hydrochlorothiazide (MAXZIDE-25) 37.5-25 MG tablet Take 1 tablet by mouth daily.  . [DISCONTINUED] ALPRAZolam (XANAX) 0.25 MG tablet Take 1 tablet (0.25 mg total) by mouth at bedtime as needed for anxiety.  . [DISCONTINUED] zaleplon (SONATA) 5 MG capsule Take 1 capsule (5 mg total) by mouth at bedtime as needed for sleep.   No facility-administered encounter medications on file as of 03/07/2016.    Activities of Daily Living In your present state  of health, do you have any difficulty performing the following activities: 03/07/2016  Hearing? N  Vision? N  Difficulty concentrating or making decisions? N  Walking or climbing stairs? N  Dressing or bathing? N  Doing errands, shopping? N  Preparing Food and eating ? N  Using the Toilet? N  In the past six months, have you accidently leaked urine? Y  Do you have problems with loss of bowel control? N  Managing your Medications? N  Managing your Finances? N  Housekeeping or managing your Housekeeping? N    Patient Care Team: Abner Greenspan, MD as PCP - General Carloyn Manner, MD as Referring Physician (Otolaryngology) Hessie Knows, MD as Consulting Physician (Orthopedic Surgery) Rainey Pines, MD as Referring Physician (Psychiatry) Manya Silvas, MD as Consulting Physician (Gastroenterology) Eula Flax, OD as Referring Physician (Optometry)    Assessment:    Vision Screening  Comments: Last exam in June 2016; next appt scheduled 03/14/16 with Dr. Glennon Mac @ [place] and Dietary recommendations Current Exercise Habits: Home exercise routine, Type of exercise: walking;Other - see comments (stationary bike), Time (Minutes): 30, Frequency (Times/Week): 6, Weekly Exercise (Minutes/Week): 180, Intensity: Moderate, Exercise limited by: None identified  Goals    . Increase physical activity     Starting 03/07/2016, I will continue to exercise for at least 30 min 5-6 days per week.       Fall Risk Fall Risk  03/07/2016 01/23/2016 01/21/2015 01/19/2014  Falls in the past year? Yes Yes Yes No  Number falls in past yr: 1 1 1  -  Injury with Fall? No No No -  Follow up Falls evaluation completed - - -   Depression Screen PHQ 2/9 Scores 03/07/2016 01/23/2016 01/21/2015 01/19/2014  PHQ - 2 Score 0 0 0 0     Cognitive Testing MMSE - Mini Mental State Exam 03/07/2016  Orientation to time 5  Orientation to Place 5  Registration 3  Attention/ Calculation 0  Recall 3  Language- name 2 objects 0  Language- repeat 1  Language- follow 3 step command 3  Language- read & follow direction 0  Write a sentence 0  Copy design 0  Total score 20   PLEASE NOTE: A Mini-Cog screen was completed. Maximum score is 20. A value of 0 denotes this part of Folstein MMSE was not completed or the patient failed this part of the Mini-Cog screening.   Mini-Cog Screening Orientation to Time - Max 5 pts Orientation to Place - Max 5 pts Registration - Max 3 pts Recall - Max 3 pts Language Repeat - Max 1 pts Language Follow 3 Step Command - Max 3 pts  Immunization History  Administered Date(s) Administered  . H1N1 10/04/2008  . Hepatitis B 01/16/1992, 02/16/1992, 08/17/1992  . Influenza Split 07/18/2011, 06/04/2012  . Influenza Whole 06/17/2008, 07/19/2009, 07/11/2010  . Influenza,inj,Quad PF,36+ Mos 06/02/2013  . Influenza,trivalent, recombinat, inj, PF 06/09/2015  .  Influenza-Unspecified 06/10/2014  . Pneumococcal Polysaccharide-23 09/17/2002  . Td 10/04/2008  . Zoster 05/25/2009   Screening Tests Health Maintenance  Topic Date Due  . Hepatitis C Screening  04/26/2016 (Originally [birth year])  . PNA vac Low Risk Adult (1 of 2 - PCV13) 10/19/2020 (Originally 05/13/2011)  . INFLUENZA VACCINE  04/17/2016  . MAMMOGRAM  06/02/2016  . TETANUS/TDAP  10/04/2018  . COLONOSCOPY  09/23/2021  . DEXA SCAN  Completed  . ZOSTAVAX  Completed      Plan:     I have personally  reviewed and addressed the Medicare Annual Wellness questionnaire and have noted the following in the patient's chart:  A. Medical and social history B. Use of alcohol, tobacco or illicit drugs  C. Current medications and supplements D. Functional ability and status E.  Nutritional status F.  Physical activity G. Advance directives H. List of other physicians I.  Hospitalizations, surgeries, and ER visits in previous 12 months J.  [place] to include hearing, vision, cognitive, depression L. Referrals and appointments - none  In addition, I have reviewed and discussed with patient certain preventive protocols, quality metrics, and best practice recommendations. A written personalized care plan for preventive services as well as general preventive health recommendations were provided to patient.  See attached scanned questionnaire for additional information.   Signed,   Lindell Noe, MHA, BS, LPN Health Advisor

## 2016-03-07 NOTE — Progress Notes (Signed)
Pre visit review using our clinic review tool, if applicable. No additional management support is needed unless otherwise documented below in the visit note. 

## 2016-03-27 ENCOUNTER — Ambulatory Visit (INDEPENDENT_AMBULATORY_CARE_PROVIDER_SITE_OTHER): Payer: 59 | Admitting: Psychiatry

## 2016-03-27 DIAGNOSIS — F411 Generalized anxiety disorder: Secondary | ICD-10-CM | POA: Diagnosis not present

## 2016-03-27 DIAGNOSIS — F331 Major depressive disorder, recurrent, moderate: Secondary | ICD-10-CM | POA: Diagnosis not present

## 2016-03-27 MED ORDER — ALPRAZOLAM 0.5 MG PO TABS: 0.5000 mg | ORAL_TABLET | Freq: Every evening | ORAL | Status: DC | PRN

## 2016-03-27 NOTE — Progress Notes (Signed)
BH MD/PA/NP OP Progress Note  03/27/2016 10:14 AM [name]  MRN:  [id number]  Subjective:  Patient is a 70 year old female who presented for the follow-up appointment. She reported that she is not able to sleep since she has stopped taking the Xanax. She reported that she has tried several medications for insomnia and none of them is working. She has also tried the [employer]  Samples  and did not work well. She is willing to go back on the Xanax 0.5 mg at bedtime. She appeared calm and collected during the interview. We discussed at length about the medications. She is also concerned about her knee pain as she reported that the partial knee replacement was not effective and she is willing to have the full replacement done. She is going to discuss that with her orthopedics at [facility] clinic.  Pt  reported that she has been compliant with her medications. She denied having any suicidal ideations or plans. She denied having any perceptual disturbances. She has good relationship with her family members.  Chief Complaint:   Visit Diagnosis:     ICD-9-CM ICD-10-CM   1. MDD (major depressive disorder), recurrent episode, moderate (HCC) 296.32 F33.1   2. Generalized anxiety disorder 300.02 F41.1     Past Medical History:  Past Medical History  Diagnosis Date  . Anxiety   . Panic disorder   . Depression   . Voice disorder   . Hx of colonic polyp   . GERD (gastroesophageal reflux disease)   . Hyperlipemia     Very high chol intol of statins-does not want to check it  . Hypertension   . Hyperglycemia   . Tremor     from anx  . Urinary incontinence     mixed  . Allergic rhinitis     gets allergy shot from Dr. Carlis Abbott  . History of shingles     at age 89- mild    Past Surgical History  Procedure Laterality Date  . Partial hysterectomy      with fibroid  . Nasal sinus surgery    . Bladder suspension    . Rectocele repair    . Abdominal hysterectomy    . Medial partial knee  replacement Left 2016  . Joint replacement      knee replacement - march 15   Family History:  Family History  Problem Relation Age of Onset  . Diabetes Mother   . Hyperlipidemia Mother   . Hypertension Mother   . Osteoarthritis Mother   . Cancer Father     Colon  . Syncope episode Daughter   . Leukemia Sister    Social History:  Social History   Social History  . Marital Status: Married    Spouse Name: N/A  . Number of Children: 2  . Years of Education: N/A   Occupational History  . 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 Smoking status: Never Smoker   . Smokeless tobacco: Never Used  . Alcohol Use: No  . Drug Use: No  . Sexual Activity: No   Other Topics Concern  . Not on file   Social History Narrative   Additional History:  Lives with husband. She stated that she spends time by exercise and doing hosehold chores. She stated that she is having issues due to knee replacement now.   Assessment:   Musculoskeletal: Strength & Muscle Tone: within normal limits Gait & Station: normal  Patient leans: N/A  Psychiatric Specialty Exam: HPI  Review of Systems  Constitutional: Negative for weight loss.  HENT: Negative for hearing loss.   Eyes: Negative for photophobia.  Respiratory: Negative for hemoptysis.   Cardiovascular: Negative for palpitations.  Gastrointestinal: Negative for vomiting.  Genitourinary: Negative for urgency.  Musculoskeletal: Positive for myalgias, back pain and joint pain.  Neurological: Negative for tremors.  Endo/Heme/Allergies: Negative for environmental allergies.  Psychiatric/Behavioral: Negative for depression and hallucinations. The patient has insomnia. The patient is not nervous/anxious.   All other systems reviewed and are negative.   Last menstrual period 09/18/1987.There is no weight on file to calculate BMI.  General Appearance: Casual  Eye Contact:  Fair  Speech:  Clear and Coherent   Volume:  Normal  Mood:  Anxious  Affect:  Appropriate  Thought Process:  Logical  Orientation:  Full (Time, Place, and Person)  Thought Content:  WDL  Suicidal Thoughts:  No  Homicidal Thoughts:  No  Memory:  Immediate;   Good  Judgement:  Fair  Insight:  Fair  Psychomotor Activity:  Normal  Concentration:  Fair  Recall:  [employer] of Knowledge: Fair  Language: Fair  Akathisia:  No  Handed:  Right  AIMS (if indicated):  none  Assets:  Communication Skills  ADL's:  Intact  Cognition: WNL  Sleep:  5-6    Is the patient at risk to self?  No. Has the patient been a risk to self in the past 6 months?  No. Has the patient been a risk to self within the distant past?  No. Is the patient a risk to others?  No. Has the patient been a risk to others in the past 6 months?  No.   Has the patient been a risk to others within the distant past?  No.  Current Medications: Current Outpatient Prescriptions  Medication Sig Dispense Refill  . amLODipine (NORVASC) 5 MG tablet Take 1 tablet (5 mg total) by mouth daily. 90 tablet 3  . Azelastine HCl (ASTEPRO) 0.15 % SOLN 2 sprays by Nasal route 2 (two) times daily as needed. 30 mL 11  . Calcium Carbonate-Vitamin D (CALCIUM 600+D PO) Take by mouth 2 (two) times daily.      . cetirizine (ZYRTEC) 10 MG tablet Take 10 mg by mouth daily.      Marland Kitchen EPINEPHrine 0.3 mg/0.3 mL IJ SOAJ injection as needed.    . Multiple Vitamin (MULTIVITAMIN) tablet Take 1 tablet by mouth daily.      . Omega-3 Fatty Acids (FISH OIL) 1200 MG CAPS Take by mouth daily.      Marland Kitchen omeprazole (PRILOSEC) 20 MG capsule Take 1 capsule (20 mg total) by mouth 2 (two) times daily. 180 capsule 3  . ramipril (ALTACE) 10 MG capsule TAKE 1 CAPSULE BY MOUTH EVERY DAY 90 capsule 3  . sertraline (ZOLOFT) 50 MG tablet Take 1.5 tablets daily 135 tablet 1  . triamterene-hydrochlorothiazide (MAXZIDE-25) 37.5-25 MG tablet Take 1 tablet by mouth daily. 90 tablet 3   No current  facility-administered medications for this visit.    Medical Decision Making:  Established Problem, Stable/Improving (1)  Treatment Plan Summary:Medication management   Discussed with patient about her medications and   She will continue her on the medications as follows Zoloft 75 mg by mouth daily  Prescribed Xanax  0.5  milligram by mouth  qhs when necessary basis #90.   I will start her on the 5 mg by mouth daily at bedtime  when necessary for sleep and discussed with her about the side effects of medication in detail and she demonstrated understanding    Follow-up in 3  months  SSRI Discussed with pt about the [place] warnings, increased risk of suicidal thinking when starting the medications.  GI side effects, sexual side effects, increase in manic or hypomanic symptoms as well as the discontinuation syndromes.Advised about withdrawal symptoms if stopped immediately. Pt demonstrated understanding.    BENZODIAZEPINES: Benzodiazepine medications may be habit forming.  If you feel the medication is not working as well, do not take more than the prescribed dose.  Taking too much of a benzodiazepine medication may cause respiratory depression and death.  Always store medication safely and away from children.  Benzodiazepine medications can cause drowsiness.  Avoid driving, operating machinery, or doing anything dangerous if you are not alert.Do not drink alcohol when taking benzodiazepine medications.  This will increase the sedative effect, and could lead to alcohol toxicity and death.     More than 50% of the time spent in psychoeducation, counseling and coordination of care.    This note was generated in part or whole with voice recognition software. Voice regonition is usually quite accurate but there are transcription errors that can and very often do occur. I apologize for any typographical errors that were not detected and corrected.   Rainey Pines, MD  03/27/2016, 10:14 AM

## 2016-04-10 ENCOUNTER — Encounter
Admission: RE | Admit: 2016-04-10 | Discharge: 2016-04-10 | Disposition: A | Discharge status: Home / Self Care | Payer: Medicare Other | Source: Ambulatory Visit | Attending: Orthopedic Surgery | Admitting: Orthopedic Surgery

## 2016-04-10 DIAGNOSIS — Z01812 Encounter for preprocedural laboratory examination: Secondary | ICD-10-CM | POA: Diagnosis not present

## 2016-04-10 DIAGNOSIS — Z01810 Encounter for preprocedural cardiovascular examination: Secondary | ICD-10-CM | POA: Insufficient documentation

## 2016-04-10 HISTORY — DX: Unspecified osteoarthritis, unspecified site: M19.90

## 2016-04-10 HISTORY — DX: Type 2 diabetes mellitus without complications: E11.9

## 2016-04-10 LAB — BASIC METABOLIC PANEL
ANION GAP: 8 (ref 5–15)
BUN: 23 mg/dL — ABNORMAL HIGH (ref 6–20)
CALCIUM: 9.4 mg/dL (ref 8.9–10.3)
CO2: 28 mmol/L (ref 22–32)
CREATININE: 0.87 mg/dL (ref 0.44–1.00)
Chloride: 104 mmol/L (ref 101–111)
Glucose, Bld: 86 mg/dL (ref 65–99)
Potassium: 4 mmol/L (ref 3.5–5.1)
SODIUM: 140 mmol/L (ref 135–145)

## 2016-04-10 NOTE — Patient Instructions (Signed)
  Your procedure is scheduled [id number] August 10 , 2017. Report to Same Day Surgery. To find out your arrival time please call [phone] between 1PM - 3PM on Wednesday April 25, 2016.  Remember: Instructions that are not followed completely may result in serious medical risk, up to and including death, or upon the discretion of your surgeon and anesthesiologist your surgery may need to be rescheduled.    _x___ 1. Do not eat food or drink liquids after midnight. No gum chewing or hard candies.     ____ 2. No Alcohol for 24 hours before or after surgery.   ____ 3. Bring all medications with you on the day of surgery if instructed.    __x__ 4. Notify your doctor if there is any change in your medical condition     (cold, fever, infections).     Do not wear jewelry, make-up, hairpins, clips or nail polish.  Do not wear lotions, powders, or perfumes. You may wear deodorant.  Do not shave 48 hours prior to surgery. Men may shave face and neck.  Do not bring valuables to the hospital.    [facility] is not responsible for any belongings or valuables.               Contacts, dentures or bridgework may not be worn into surgery.  Leave your suitcase in the car. After surgery it may be brought to your room.  For patients admitted to the hospital, discharge time is determined by your treatment team.   Patients discharged the day of surgery will not be allowed to drive home.    Please read over the following fact sheets that you were given:   [facility] Preparing for Surgery  _x___ Take these medicines the morning of surgery with A SIP OF WATER:    1. Amlodipine  2. RAMIPRIL  3. PRILOSEC   ____ Fleet Enema (as directed)   _x___ Use CHG Soap as directed on instruction sheet  ____ Use inhalers on the day of surgery and bring to hospital day of surgery  ____ Stop metformin 2 days prior to surgery    ____ Take 1/2 of usual insulin dose the night before surgery and none on the  morning of surgery.   ____ Stop Coumadin/Plavix/aspirin on does not apply.  _X___ Stop Anti-inflammatories such as Advil, Aleve, Ibuprofen, Motrin, Naproxen, Naprosyn, Goodies powders or aspirin products. OK TO TAKE TYLENOL.  _X___ Stop supplements FISH OIL 7 DAYS PRIOR TO SURGERY.    ____ Bring C-Pap to the hospital.

## 2016-04-17 ENCOUNTER — Other Ambulatory Visit: Payer: Medicare Other

## 2016-04-23 ENCOUNTER — Other Ambulatory Visit: Payer: Medicare Other

## 2016-04-26 ENCOUNTER — Ambulatory Visit: Payer: Medicare Other | Admitting: Anesthesiology

## 2016-04-26 ENCOUNTER — Encounter: Payer: Self-pay | Admitting: *Deleted

## 2016-04-26 ENCOUNTER — Ambulatory Visit
Admission: RE | Admit: 2016-04-26 | Discharge: 2016-04-26 | Disposition: A | Discharge status: Home / Self Care | Payer: Medicare Other | Source: Ambulatory Visit | Attending: Orthopedic Surgery | Admitting: Orthopedic Surgery

## 2016-04-26 ENCOUNTER — Other Ambulatory Visit: Payer: Medicare Other

## 2016-04-26 ENCOUNTER — Encounter
Admission: RE | Disposition: A | Discharge status: Home / Self Care | Payer: Self-pay | Source: Ambulatory Visit | Attending: Orthopedic Surgery

## 2016-04-26 DIAGNOSIS — F419 Anxiety disorder, unspecified: Secondary | ICD-10-CM | POA: Diagnosis not present

## 2016-04-26 DIAGNOSIS — Z86010 Personal history of colonic polyps: Secondary | ICD-10-CM | POA: Diagnosis not present

## 2016-04-26 DIAGNOSIS — Z96652 Presence of left artificial knee joint: Secondary | ICD-10-CM | POA: Diagnosis not present

## 2016-04-26 DIAGNOSIS — M6588 Other synovitis and tenosynovitis, other site: Secondary | ICD-10-CM | POA: Insufficient documentation

## 2016-04-26 DIAGNOSIS — I10 Essential (primary) hypertension: Secondary | ICD-10-CM | POA: Insufficient documentation

## 2016-04-26 DIAGNOSIS — F329 Major depressive disorder, single episode, unspecified: Secondary | ICD-10-CM | POA: Diagnosis not present

## 2016-04-26 DIAGNOSIS — E119 Type 2 diabetes mellitus without complications: Secondary | ICD-10-CM | POA: Insufficient documentation

## 2016-04-26 DIAGNOSIS — K219 Gastro-esophageal reflux disease without esophagitis: Secondary | ICD-10-CM | POA: Diagnosis not present

## 2016-04-26 DIAGNOSIS — E785 Hyperlipidemia, unspecified: Secondary | ICD-10-CM | POA: Insufficient documentation

## 2016-04-26 DIAGNOSIS — M1990 Unspecified osteoarthritis, unspecified site: Secondary | ICD-10-CM | POA: Diagnosis not present

## 2016-04-26 DIAGNOSIS — M25562 Pain in left knee: Secondary | ICD-10-CM | POA: Diagnosis present

## 2016-04-26 HISTORY — PX: SYNOVECTOMY: SHX5180

## 2016-04-26 HISTORY — PX: KNEE ARTHROSCOPY: SHX127

## 2016-04-26 LAB — GLUCOSE, CAPILLARY
Glucose-Capillary: 111 mg/dL — ABNORMAL HIGH (ref 65–99)
Glucose-Capillary: 105 mg/dL — ABNORMAL HIGH (ref 65–99)

## 2016-04-26 SURGERY — ARTHROSCOPY, KNEE
Anesthesia: General | Site: Knee | Laterality: Left | Wound class: Clean

## 2016-04-26 MED ORDER — FENTANYL CITRATE (PF) 100 MCG/2ML IJ SOLN
INTRAMUSCULAR | Status: AC
  Administered 2016-04-26: 50 ug via INTRAVENOUS
  Filled 2016-04-26: qty 2

## 2016-04-26 MED ORDER — METOCLOPRAMIDE HCL 10 MG PO TABS: 5.0000 mg | ORAL_TABLET | Freq: Three times a day (TID) | ORAL | Status: DC | PRN

## 2016-04-26 MED ORDER — FENTANYL CITRATE (PF) 100 MCG/2ML IJ SOLN
25.0000 ug | INTRAMUSCULAR | Status: DC | PRN
  Administered 2016-04-26 (×3): 50 ug via INTRAVENOUS

## 2016-04-26 MED ORDER — ONDANSETRON HCL 4 MG/2ML IJ SOLN
INTRAMUSCULAR | Status: DC | PRN
  Administered 2016-04-26: 4 mg via INTRAVENOUS

## 2016-04-26 MED ORDER — OXYCODONE HCL 5 MG/5ML PO SOLN: 5.0000 mg | Freq: Once | ORAL | Status: DC | PRN

## 2016-04-26 MED ORDER — ONDANSETRON HCL 4 MG/2ML IJ SOLN: 4.0000 mg | Freq: Four times a day (QID) | INTRAMUSCULAR | Status: DC | PRN

## 2016-04-26 MED ORDER — GLYCOPYRROLATE 0.2 MG/ML IJ SOLN
INTRAMUSCULAR | Status: DC | PRN
  Administered 2016-04-26: 0.2 mg via INTRAVENOUS

## 2016-04-26 MED ORDER — OXYCODONE HCL 5 MG PO TABS: 5.0000 mg | ORAL_TABLET | Freq: Once | ORAL | Status: DC | PRN

## 2016-04-26 MED ORDER — MIDAZOLAM HCL 2 MG/2ML IJ SOLN
INTRAMUSCULAR | Status: DC | PRN
Start: 2016-04-26 — End: 2016-04-26
  Administered 2016-04-26: 2 mg via INTRAVENOUS

## 2016-04-26 MED ORDER — MEPERIDINE HCL 25 MG/ML IJ SOLN: 6.2500 mg | INTRAMUSCULAR | Status: DC | PRN

## 2016-04-26 MED ORDER — PROMETHAZINE HCL 25 MG/ML IJ SOLN: 6.2500 mg | INTRAMUSCULAR | Status: DC | PRN

## 2016-04-26 MED ORDER — FENTANYL CITRATE (PF) 100 MCG/2ML IJ SOLN
INTRAMUSCULAR | Status: DC | PRN
  Administered 2016-04-26 (×2): 50 ug via INTRAVENOUS

## 2016-04-26 MED ORDER — HYDROCODONE-ACETAMINOPHEN 5-325 MG PO TABS
ORAL_TABLET | ORAL | Status: AC
  Filled 2016-04-26: qty 1

## 2016-04-26 MED ORDER — CLINDAMYCIN PHOSPHATE 900 MG/50ML IV SOLN
900.0000 mg | Freq: Once | INTRAVENOUS | Status: AC
  Administered 2016-04-26: 900 mg via INTRAVENOUS

## 2016-04-26 MED ORDER — HYDROCODONE-ACETAMINOPHEN 5-325 MG PO TABS: 1.0000 | ORAL_TABLET | Freq: Four times a day (QID) | ORAL | 0 refills | Status: DC | PRN

## 2016-04-26 MED ORDER — EPHEDRINE SULFATE 50 MG/ML IJ SOLN
INTRAMUSCULAR | Status: DC | PRN
  Administered 2016-04-26: 10 mg via INTRAVENOUS

## 2016-04-26 MED ORDER — BUPIVACAINE-EPINEPHRINE (PF) 0.5% -1:200000 IJ SOLN
INTRAMUSCULAR | Status: AC
  Filled 2016-04-26: qty 30

## 2016-04-26 MED ORDER — METOCLOPRAMIDE HCL 5 MG/ML IJ SOLN: 5.0000 mg | Freq: Three times a day (TID) | INTRAMUSCULAR | Status: DC | PRN

## 2016-04-26 MED ORDER — SODIUM CHLORIDE 0.9 % IV SOLN: INTRAVENOUS | Status: DC

## 2016-04-26 MED ORDER — PROPOFOL 10 MG/ML IV BOLUS
INTRAVENOUS | Status: DC | PRN
  Administered 2016-04-26: 150 mg via INTRAVENOUS
  Administered 2016-04-26: 30 mg via INTRAVENOUS

## 2016-04-26 MED ORDER — LIDOCAINE HCL (CARDIAC) 20 MG/ML IV SOLN
INTRAVENOUS | Status: DC | PRN
  Administered 2016-04-26: 100 mg via INTRAVENOUS

## 2016-04-26 MED ORDER — HYDROCODONE-ACETAMINOPHEN 5-325 MG PO TABS
1.0000 | ORAL_TABLET | ORAL | Status: DC | PRN
  Administered 2016-04-26: 1 via ORAL

## 2016-04-26 MED ORDER — CLINDAMYCIN PHOSPHATE 900 MG/50ML IV SOLN
INTRAVENOUS | Status: AC
  Filled 2016-04-26: qty 50

## 2016-04-26 MED ORDER — ONDANSETRON HCL 4 MG PO TABS: 4.0000 mg | ORAL_TABLET | Freq: Four times a day (QID) | ORAL | Status: DC | PRN

## 2016-04-26 MED ORDER — SODIUM CHLORIDE 0.9 % IV SOLN
INTRAVENOUS | Status: DC
  Administered 2016-04-26: 08:00:00 via INTRAVENOUS

## 2016-04-26 SURGICAL SUPPLY — 28 items
BANDAGE ACE 4X5 VEL STRL LF (GAUZE/BANDAGES/DRESSINGS) ×3 IMPLANT
BANDAGE ELASTIC 4 LF NS (GAUZE/BANDAGES/DRESSINGS) ×3 IMPLANT
BLADE FULL RADIUS 3.5 (BLADE) IMPLANT
BLADE INCISOR PLUS 4.5 (BLADE) IMPLANT
BLADE SHAVER 4.5 DBL SERAT CV (CUTTER) IMPLANT
BLADE SHAVER 4.5X7 STR FR (MISCELLANEOUS) IMPLANT
CHLORAPREP W/TINT 26ML (MISCELLANEOUS) ×3 IMPLANT
CUFF TOURN 24 STER (MISCELLANEOUS) ×3 IMPLANT
CUFF TOURN 30 STER DUAL PORT (MISCELLANEOUS) IMPLANT
DRAPE C-ARM XRAY 36X54 (DRAPES) ×3 IMPLANT
GAUZE SPONGE 4X4 12PLY STRL (GAUZE/BANDAGES/DRESSINGS) ×3 IMPLANT
GLOVE SURG ORTHO 9.0 STRL STRW (GLOVE) ×3 IMPLANT
GOWN SRG 2XL LVL 4 RGLN SLV (GOWNS) ×1 IMPLANT
GOWN STRL NON-REIN 2XL LVL4 (GOWNS) ×2
GOWN STRL REUS W/ TWL LRG LVL3 (GOWN DISPOSABLE) ×1 IMPLANT
GOWN STRL REUS W/TWL LRG LVL3 (GOWN DISPOSABLE) ×2
IV LACTATED RINGER IRRG 3000ML (IV SOLUTION) ×4
IV LR IRRIG 3000ML ARTHROMATIC (IV SOLUTION) ×2 IMPLANT
KIT RM TURNOVER STRD PROC AR (KITS) ×3 IMPLANT
MANIFOLD NEPTUNE II (INSTRUMENTS) ×3 IMPLANT
PACK ARTHROSCOPY KNEE (MISCELLANEOUS) ×3 IMPLANT
SET TUBE SUCT SHAVER OUTFL 24K (TUBING) ×3 IMPLANT
SET TUBE TIP INTRA-ARTICULAR (MISCELLANEOUS) ×3 IMPLANT
SUT ETHILON 4-0 (SUTURE) ×2
SUT ETHILON 4-0 FS2 18XMFL BLK (SUTURE) ×1
SUTURE ETHLN 4-0 FS2 18XMF BLK (SUTURE) ×1 IMPLANT
TUBING ARTHRO INFLOW-ONLY STRL (TUBING) ×3 IMPLANT
WAND HAND CNTRL MULTIVAC 50 (MISCELLANEOUS) ×3 IMPLANT

## 2016-04-26 NOTE — Discharge Instructions (Addendum)
Take pain medicine as directed. Try to minimize activity this weekend. Leave bandage in place until follow-up visit. Take aspirin 325 mg daily until walking normally  AMBULATORY SURGERY  DISCHARGE INSTRUCTIONS   1) The drugs that you were given will stay in your system until tomorrow so for the next 24 hours you should not:  A) Drive an automobile B) Make any legal decisions C) Drink any alcoholic beverage   2) You may resume regular meals tomorrow.  Today it is better to start with liquids and gradually work up to solid foods.  You may eat anything you prefer, but it is better to start with liquids, then soup and crackers, and gradually work up to solid foods.   3) Please notify your doctor immediately if you have any unusual bleeding, trouble breathing, redness and pain at the surgery site, drainage, fever, or pain not relieved by medication.    4) Additional Instructions:        Please contact your physician with any problems or Same Day Surgery at [phone], Monday through Friday 6 am to 4 pm, or [place] at [facility] number at [phone].

## 2016-04-26 NOTE — Anesthesia Preprocedure Evaluation (Signed)
Anesthesia Evaluation  Patient identified by MRN, date of birth, ID band Patient awake    Reviewed: Allergy & Precautions, NPO status , Patient's Chart, lab work & pertinent test results  History of Anesthesia Complications Negative for: history of anesthetic complications  Airway Mallampati: I  TM Distance: >3 FB Neck ROM: Full    Dental no notable dental hx.    Pulmonary neg pulmonary ROS, neg COPD,    breath sounds clear to auscultation- rhonchi (-) wheezing      Cardiovascular Exercise Tolerance: Good hypertension, Pt. on medications (-) CAD and (-) Past MI  Rhythm:Regular Rate:Normal - Systolic murmurs and - Diastolic murmurs    Neuro/Psych Anxiety Depression negative neurological ROS     GI/Hepatic Neg liver ROS, GERD  ,  Endo/Other  diabetes (borderline, diet controlled)  Renal/GU negative Renal ROS     Musculoskeletal  (+) Arthritis , Osteoarthritis,    Abdominal (+) + obese,   Peds  Hematology negative hematology ROS (+)   Anesthesia Other Findings Past Medical History: No date: Allergic rhinitis     Comment: gets allergy shot from Dr. Carlis Abbott No date: Anxiety No date: Arthritis No date: Diabetes mellitus without complication (HCC)     Comment: diet controlled No date: GERD (gastroesophageal reflux disease) No date: History of shingles     Comment: at age 2- mild No date: Hx of colonic polyp No date: Hyperglycemia No date: Hyperlipemia     Comment: Very high chol intol of statins-does not want               to check it No date: Hypertension No date: Panic disorder No date: Tremor     Comment: from anx No date: Urinary incontinence     Comment: mixed No date: Voice disorder   Reproductive/Obstetrics                             Anesthesia Physical Anesthesia Plan  ASA: II  Anesthesia Plan: General   Post-op Pain Management:    Induction: Intravenous  Airway  Management Planned: LMA  Additional Equipment:   Intra-op Plan:   Post-operative Plan:   Informed Consent: I have reviewed the patients History and Physical, chart, labs and discussed the procedure including the risks, benefits and alternatives for the proposed anesthesia with the patient or authorized representative who has indicated his/her understanding and acceptance.   Dental advisory given  Plan Discussed with: CRNA and Anesthesiologist  Anesthesia Plan Comments:         Anesthesia Quick Evaluation

## 2016-04-26 NOTE — H&P (Signed)
Reviewed paper H+P, will be scanned into chart. No changes noted.  

## 2016-04-26 NOTE — OR Nursing (Signed)
Clindamycin sent to OR with patient 

## 2016-04-26 NOTE — Anesthesia Postprocedure Evaluation (Signed)
Anesthesia Post Note  Patient: [name]  Procedure(s) Performed: Procedure(s) (LRB): ARTHROSCOPY KNEE (Left) PARTIAL SYNOVECTOMY (Left)  Patient location during evaluation: PACU Anesthesia Type: General Level of consciousness: awake and alert and oriented Pain management: pain level controlled Vital Signs Assessment: post-procedure vital signs reviewed and stable Respiratory status: spontaneous breathing, nonlabored ventilation and respiratory function stable Cardiovascular status: blood pressure returned to baseline and stable Postop Assessment: no signs of nausea or vomiting Anesthetic complications: no    Last Vitals:  Vitals:   04/26/16 0921 04/26/16 0926  BP: 119/65   Pulse: 79 83  Resp: 14 (!) 8  Temp:      Last Pain:  Vitals:   04/26/16 0926  TempSrc:   PainSc: 6                  Sergey Ishler

## 2016-04-26 NOTE — Transfer of Care (Signed)
Immediate Anesthesia Transfer of Care Note  Patient: [name]  Procedure(s) Performed: Procedure(s): ARTHROSCOPY KNEE (Left) PARTIAL SYNOVECTOMY (Left)  Patient Location: PACU  Anesthesia Type:General  Level of Consciousness: sedated and responds to stimulation  Airway & Oxygen Therapy: Patient Spontanous Breathing and Patient connected to face mask oxygen  Post-op Assessment: Report given to RN and Post -op Vital signs reviewed and stable  Post vital signs: Reviewed and stable  Last Vitals:  Vitals:   04/26/16 0852 04/26/16 0853  BP: 94/81 94/81  Pulse: 88 89  Resp: 18   Temp: 36.4 C     Last Pain:  Vitals:   04/26/16 0718  TempSrc: Oral  PainSc: 6          Complications: No apparent anesthesia complications

## 2016-04-26 NOTE — Op Note (Signed)
04/26/2016  8:47 AM  PATIENT:  [name]  70 y.o. female  PRE-OPERATIVE DIAGNOSIS:  STATUS POST LEFT PARTIAL KNEE REPLACEMENT,KNEE PAIN UNSPECIFIED, synovitis  POST-OPERATIVE DIAGNOSIS:  STATUS POST LEFT PARTIAL KNEE REPLACEMENT, synovitis  PROCEDURE:  Procedure(s): ARTHROSCOPY KNEE (Left) PARTIAL SYNOVECTOMY (Left)  SURGEON: Laurene Footman, MD  ASSISTANTS: None  ANESTHESIA:   general  EBL:  Total I/O In: -  Out: 2 [Blood:2]  BLOOD ADMINISTERED:none  DRAINS: none   LOCAL MEDICATIONS USED:  NONE  SPECIMEN:  No Specimen  DISPOSITION OF SPECIMEN:  N/A  COUNTS:  YES  TOURNIQUET:    IMPLANTS: None  DICTATION: .Dragon Dictation patient was brought to the operating room and after adequate anesthesia was obtained the left leg was prepped and draped in sterile fashion with a tourniquet and leg holder applied. After patient identification and timeout procedures were completed, an inferior lateral portal was made and the arthroscope introduced. Care was taken not to damage the previous implants. Initial inspection revealed mild patellofemoral chondromalacia with plica band medially there appeared to impinge over the femoral condyle, additionally there was a layer of scar tissue that was impinging at the articulation of the femoral condyle and the plastic on the tibia. An inferior medial portal was made and ArthriCare wand was used to address both these bands of tissue to prevent impingement around the implant. The anterior cruciate ligament was intact and lateral compartment was normal in appearance the gutters were absent any loose bodies. After thorough irrigation of the knee after removing the bands of scar and synovium on range of motion the tibial bearing surface appeared to move well. Instrumentation was withdrawn and the wounds closed with simple 4-0 nylon followed by Xeroform 4 x 4 web roll and Ace wrap  PLAN OF CARE: Discharge to home after PACU  PATIENT DISPOSITION:  PACU  - hemodynamically stable.

## 2016-04-26 NOTE — Anesthesia Procedure Notes (Signed)
Procedure Name: LMA Insertion Performed by: Terri Rorrer Pre-anesthesia Checklist: Patient identified, Patient being monitored, Timeout performed, Emergency Drugs available and Suction available Patient Re-evaluated:Patient Re-evaluated prior to inductionOxygen Delivery Method: Circle system utilized Preoxygenation: Pre-oxygenation with 100% oxygen Intubation Type: IV induction Ventilation: Mask ventilation without difficulty LMA: LMA inserted LMA Size: 4.0 Tube type: Oral Number of attempts: 1 Placement Confirmation: positive ETCO2 and breath sounds checked- equal and bilateral Tube secured with: Tape Dental Injury: Teeth and Oropharynx as per pre-operative assessment        

## 2016-04-27 ENCOUNTER — Encounter: Payer: Self-pay | Admitting: Orthopedic Surgery

## 2016-05-14 ENCOUNTER — Ambulatory Visit (INDEPENDENT_AMBULATORY_CARE_PROVIDER_SITE_OTHER): Payer: Medicare Other | Admitting: Family Medicine

## 2016-05-14 ENCOUNTER — Encounter: Payer: Self-pay | Admitting: Family Medicine

## 2016-05-14 ENCOUNTER — Telehealth: Payer: Self-pay

## 2016-05-14 DIAGNOSIS — L0231 Cutaneous abscess of buttock: Secondary | ICD-10-CM | POA: Insufficient documentation

## 2016-05-14 MED ORDER — SULFAMETHOXAZOLE-TRIMETHOPRIM 800-160 MG PO TABS: 2.0000 | ORAL_TABLET | Freq: Two times a day (BID) | ORAL | 0 refills | Status: DC

## 2016-05-14 NOTE — Progress Notes (Signed)
Pre visit review using our clinic review tool, if applicable. No additional management support is needed unless otherwise documented below in the visit note. 

## 2016-05-14 NOTE — Telephone Encounter (Signed)
Pt left v/m; pt seen earlier today and the abx instructions are take 2 tabs bid; quantity give # 6. Pt request cb about quantity given.

## 2016-05-14 NOTE — Assessment & Plan Note (Signed)
No pus pocket noted, no indication for I and D. Will have pt treat with warm compress. Given age and pt level of concern will treat with antibiotics. Given recent hospitalization with cover for MRSA.

## 2016-05-14 NOTE — Progress Notes (Signed)
   Subjective:    Patient ID: [name], female    DOB: 01-11-1946, 70 y.o.   MRN: [id number]  HPI  70 year old female pt of Dr. Marliss Coots presents with a hard spot near her rectum.  She reports 5 days ago noted soreness on right near rectum. Feels hard lump size of pea. Pain not worsening. Area is red, but not spreading. No change in size. No discharge or bleeding. No fever. No flu like illness.  She has not treated are with anything.  No known exposure to MRSA, but recent knee surgery with hospitalization in last month. Review of Systems  Constitutional: Negative for fatigue.  HENT: Negative for ear pain.   Eyes: Negative for pain.  Respiratory: Negative for shortness of breath.   Cardiovascular: Negative for chest pain.  Gastrointestinal: Negative for abdominal distention.       Objective:   Physical Exam  Constitutional: Vital signs are normal. She appears well-developed and well-nourished. She is cooperative.  Non-toxic appearance. She does not appear ill. No distress.  HENT:  Head: Normocephalic.  Right Ear: Hearing, tympanic membrane, external ear and ear canal normal. Tympanic membrane is not erythematous, not retracted and not bulging.  Left Ear: Hearing, tympanic membrane, external ear and ear canal normal. Tympanic membrane is not erythematous, not retracted and not bulging.  Nose: No mucosal edema or rhinorrhea. Right sinus exhibits no maxillary sinus tenderness and no frontal sinus tenderness. Left sinus exhibits no maxillary sinus tenderness and no frontal sinus tenderness.  Mouth/Throat: Uvula is midline, oropharynx is clear and moist and mucous membranes are normal.  Eyes: Conjunctivae, EOM and lids are normal. Pupils are equal, round, and reactive to light. Lids are everted and swept, no foreign bodies found.  Neck: Trachea normal and normal range of motion. Neck supple. Carotid bruit is not present. No thyroid mass and no thyromegaly present.  Cardiovascular:  Normal rate, regular rhythm, S1 normal, S2 normal, normal heart sounds, intact distal pulses and normal pulses.  Exam reveals no gallop and no friction rub.   No murmur heard. Pulmonary/Chest: Effort normal and breath sounds normal. No tachypnea. No respiratory distress. She has no decreased breath sounds. She has no wheezes. She has no rhonchi. She has no rales.  Abdominal: Soft. Normal appearance and bowel sounds are normal. There is no tenderness.  Genitourinary: Vagina normal. There is no rash or tenderness on the right labia. There is no rash or tenderness on the left labia.  Genitourinary Comments:  Right buttock with 1 cm firm tender area, no fluctuance, no central pustule, but pore, unable to express any pus, slight erythema, minimal warmth.  Neurological: She is alert.  Skin: Skin is warm, dry and intact. No rash noted.  Psychiatric: Her speech is normal and behavior is normal. Judgment and thought content normal. Her mood appears not anxious. Cognition and memory are normal. She does not exhibit a depressed mood.          Assessment & Plan:

## 2016-05-14 NOTE — Patient Instructions (Addendum)
Warm compresses on affected  area 15 min 3-4 times daily and or sitz baths/warm water soaks.  Complete course of antibiotics. Wash all underclothes, clothes, sheets, towels in hot water and bleach if able.  Discard personal items such as razors if used near the site.  Call if redness spreading, pain not improving as expected or call sooner if fever or not tolerating antibiotics. If interested in follow up check, make appt in 1 week for re-eval.

## 2016-05-15 ENCOUNTER — Other Ambulatory Visit: Payer: Self-pay | Admitting: Family Medicine

## 2016-05-15 DIAGNOSIS — Z1231 Encounter for screening mammogram for malignant neoplasm of breast: Secondary | ICD-10-CM

## 2016-05-15 MED ORDER — SULFAMETHOXAZOLE-TRIMETHOPRIM 800-160 MG PO TABS: 2.0000 | ORAL_TABLET | Freq: Two times a day (BID) | ORAL | 0 refills | Status: AC

## 2016-05-15 NOTE — Telephone Encounter (Signed)
Correction.. Please call in a total of 7 days of medicaiton.Marland Kitchen #22 more tabs.

## 2016-05-15 NOTE — Telephone Encounter (Signed)
The remaining course sent electronically to [facility] as instructed by Dr. Diona Browner,  Ms. [place] notified.

## 2016-05-15 NOTE — Telephone Encounter (Signed)
Pt left v/m requesting cb about quantity of abx prescribed 05/14/16. Pt will soon be out of med.

## 2016-05-28 ENCOUNTER — Ambulatory Visit (INDEPENDENT_AMBULATORY_CARE_PROVIDER_SITE_OTHER)
Admission: RE | Admit: 2016-05-28 | Discharge: 2016-05-28 | Disposition: A | Discharge status: Home / Self Care | Payer: Medicare Other | Source: Ambulatory Visit | Attending: Family Medicine | Admitting: Family Medicine

## 2016-05-28 ENCOUNTER — Telehealth: Payer: Self-pay | Admitting: *Deleted

## 2016-05-28 ENCOUNTER — Encounter: Payer: Self-pay | Admitting: Family Medicine

## 2016-05-28 ENCOUNTER — Telehealth: Payer: Self-pay | Admitting: Family Medicine

## 2016-05-28 ENCOUNTER — Ambulatory Visit (INDEPENDENT_AMBULATORY_CARE_PROVIDER_SITE_OTHER): Payer: Medicare Other | Admitting: Family Medicine

## 2016-05-28 VITALS — BP 138/76 | HR 76 | Temp 98.2°F | Ht 64.0 in | Wt 182.5 lb

## 2016-05-28 DIAGNOSIS — R05 Cough: Secondary | ICD-10-CM | POA: Insufficient documentation

## 2016-05-28 DIAGNOSIS — L0231 Cutaneous abscess of buttock: Secondary | ICD-10-CM

## 2016-05-28 DIAGNOSIS — R7989 Other specified abnormal findings of blood chemistry: Secondary | ICD-10-CM

## 2016-05-28 DIAGNOSIS — R509 Fever, unspecified: Secondary | ICD-10-CM

## 2016-05-28 DIAGNOSIS — Z1159 Encounter for screening for other viral diseases: Secondary | ICD-10-CM

## 2016-05-28 DIAGNOSIS — R739 Hyperglycemia, unspecified: Secondary | ICD-10-CM | POA: Diagnosis not present

## 2016-05-28 DIAGNOSIS — R059 Cough, unspecified: Secondary | ICD-10-CM

## 2016-05-28 DIAGNOSIS — R946 Abnormal results of thyroid function studies: Secondary | ICD-10-CM

## 2016-05-28 LAB — T4, FREE: Free T4: 0.87 ng/dL (ref 0.60–1.60)

## 2016-05-28 LAB — CBC WITH DIFFERENTIAL/PLATELET
BASOS PCT: 0.3 % (ref 0.0–3.0)
Basophils Absolute: 0 10*3/uL (ref 0.0–0.1)
EOS ABS: 0.5 10*3/uL (ref 0.0–0.7)
Eosinophils Relative: 4.4 % (ref 0.0–5.0)
HCT: 42.1 % (ref 36.0–46.0)
Hemoglobin: 14.5 g/dL (ref 12.0–15.0)
Lymphocytes Relative: 24.9 % (ref 12.0–46.0)
Lymphs Abs: 2.7 10*3/uL (ref 0.7–4.0)
MCHC: 34.4 g/dL (ref 30.0–36.0)
MCV: 86.1 fl (ref 78.0–100.0)
MONO ABS: 1.3 10*3/uL — AB (ref 0.1–1.0)
Monocytes Relative: 11.9 % (ref 3.0–12.0)
NEUTROS ABS: 6.3 10*3/uL (ref 1.4–7.7)
Neutrophils Relative %: 58.5 % (ref 43.0–77.0)
PLATELETS: 297 10*3/uL (ref 150.0–400.0)
RBC: 4.89 Mil/uL (ref 3.87–5.11)
RDW: 13.9 % (ref 11.5–15.5)
WBC: 10.7 10*3/uL — ABNORMAL HIGH (ref 4.0–10.5)

## 2016-05-28 LAB — HEMOGLOBIN A1C: HEMOGLOBIN A1C: 6.2 % (ref 4.6–6.5)

## 2016-05-28 LAB — TSH: TSH: 6.88 u[IU]/mL — AB (ref 0.35–4.50)

## 2016-05-28 MED ORDER — AZITHROMYCIN 250 MG PO TABS
ORAL_TABLET | ORAL | 0 refills | Status: DC
Start: 2016-05-28 — End: 2016-06-13

## 2016-05-28 NOTE — Progress Notes (Signed)
Pre visit review using our clinic review tool, if applicable. No additional management support is needed unless otherwise documented below in the visit note. 

## 2016-05-28 NOTE — Progress Notes (Signed)
Subjective:    Patient ID: [name], female    DOB: 01-Nov-1945, 70 y.o.   MRN: [id number]  HPI Here for uri symptoms   Since last visit had an abscess- she was on septra (it is better)- not draining  Felt nauseated on that  Done with it now   Has not felt great since she had that  Gets some fevers at night - not past 100 (low grade temp)   Her L chest feels sore when she takes a deep breath  Has been coughing for 2 weeks - not coughing up anything but feels it rattling and some tightness Also congested - a little in her head - not blowing anything out  More pnd than anything else-not spitting it out   Ears and throat feel ok    Takes mucinex and using flonase  Drinking water   She also missed her last lab appt and needs thyroid an A1C tests done   Patient Active Problem List   Diagnosis Date Noted  . Coughing 05/28/2016  . Abscess of buttock, right 05/14/2016  . Osteopenia 02/13/2016  . Routine general medical examination at a health care facility 01/23/2016  . Estrogen deficiency 01/23/2016  . Abnormal TSH 01/23/2016  . Anxiety, generalized 02/21/2015  . H/O: HTN (hypertension) 02/21/2015  . Mild depression 02/21/2015  . H/O diabetes mellitus 01/04/2015  . H/O arthritis 01/04/2015  . History of hay fever 01/04/2015  . Encounter for Medicare annual wellness exam 01/14/2013  . Obesity 01/15/2012  . Stress reaction, emotional 12/26/2010  . MENOPAUSAL SYNDROME 10/04/2008  . Hyperglycemia 12/02/2007  . TREMOR 12/02/2007  . DEPRESSION 08/04/2007  . Hyperlipidemia 01/28/2007  . ANXIETY 01/28/2007  . Essential hypertension 01/28/2007  . GERD 01/28/2007  . URINARY INCONTINENCE 01/28/2007  . COLONIC POLYPS, HX OF 01/28/2007  . HERPES ZOSTER 01/20/2007  . DYSPHONIA 01/20/2007  . COUGH, CHRONIC 01/20/2007  . ALLERGY 01/20/2007   Past Medical History:  Diagnosis Date  . Allergic rhinitis    gets allergy shot from Dr. Carlis Abbott  . Anxiety   . Arthritis   .  Diabetes mellitus without complication (HCC)    diet controlled  . GERD (gastroesophageal reflux disease)   . History of shingles    at age 33- mild  . Hx of colonic polyp   . Hyperglycemia   . Hyperlipemia    Very high chol intol of statins-does not want to check it  . Hypertension   . Panic disorder   . Tremor    from anx  . Urinary incontinence    mixed  . Voice disorder    Past Surgical History:  Procedure Laterality Date  . ABDOMINAL HYSTERECTOMY    . BLADDER SUSPENSION    . EYE SURGERY Bilateral 2000   eye lens replacement  . JOINT REPLACEMENT     knee replacement - march 15  . KNEE ARTHROSCOPY Left 04/26/2016   Procedure: ARTHROSCOPY KNEE;  Surgeon: Hessie Knows, MD;  Location: [facility];  Service: Orthopedics;  Laterality: Left;  Marland Kitchen MEDIAL PARTIAL KNEE REPLACEMENT Left 2016  . NASAL SINUS SURGERY    . PARTIAL HYSTERECTOMY     with fibroid  . RECTOCELE REPAIR    . SYNOVECTOMY Left 04/26/2016   Procedure: PARTIAL SYNOVECTOMY;  Surgeon: Hessie Knows, MD;  Location: [facility];  Service: Orthopedics;  Laterality: Left;  . TONSILLECTOMY     Social History  Substance Use Topics  . Smoking status: Never Smoker  . Smokeless tobacco:  Never Used  . Alcohol use No   Family History  Problem Relation Age of Onset  . Diabetes Mother   . Hyperlipidemia Mother   . Hypertension Mother   . Osteoarthritis Mother   . Cancer Father     Colon  . Leukemia Sister   . Syncope episode Daughter    Allergies  Allergen Reactions  . Atorvastatin     REACTION: shoulder pain  . Ezetimibe     REACTION: ?  . Lisinopril     REACTION: muscle pain  . Molds & Smuts   . Penicillins     REACTION: reaction not known Has patient had a PCN reaction causing immediate rash, facial/tongue/throat swelling, SOB or lightheadedness with hypotension: unknown Has patient had a PCN reaction causing severe rash involving mucus membranes or skin necrosis: unknown Has patient had a PCN reaction that  required hospitalization unsure Has patient had a PCN reaction occurring within the last 10 years: no If all of the above answers are "NO", then may proceed with Cephalosporin use.  . Pneumococcal Vaccine Swelling    REACTION: severe local reaction  . Pneumococcal Vaccine Polyvalent     REACTION: severe local reaction  . Pollen Extract   . Rosuvastatin     REACTION: leg pain  . Simvastatin     REACTION: muscle pain   Current Outpatient Prescriptions on File Prior to Visit  Medication Sig Dispense Refill  . ALPRAZolam (XANAX) 0.5 MG tablet Take 1 tablet (0.5 mg total) by mouth at bedtime as needed for anxiety. 90 tablet 0  . amLODipine (NORVASC) 5 MG tablet Take 1 tablet (5 mg total) by mouth daily. 90 tablet 3  . Azelastine HCl (ASTEPRO) 0.15 % SOLN 2 sprays by Nasal route 2 (two) times daily as needed. 30 mL 11  . Calcium Carbonate-Vitamin D (CALCIUM 600+D PO) Take 1 tablet by mouth 2 (two) times daily.     . cetirizine (ZYRTEC) 10 MG tablet Take 10 mg by mouth daily.      Marland Kitchen EPINEPHrine 0.3 mg/0.3 mL IJ SOAJ injection Inject 0.3 mg into the skin once. as needed.    Marland Kitchen ibuprofen (ADVIL,MOTRIN) 200 MG tablet Take 200 mg by mouth every 6 (six) hours as needed for headache or moderate pain.    . Multiple Vitamin (MULTIVITAMIN) tablet Take 1 tablet by mouth daily.      . Omega-3 Fatty Acids (FISH OIL) 1200 MG CAPS Take 1 capsule by mouth daily.     Marland Kitchen omeprazole (PRILOSEC) 20 MG capsule Take 1 capsule (20 mg total) by mouth 2 (two) times daily. 180 capsule 3  . ramipril (ALTACE) 10 MG capsule TAKE 1 CAPSULE BY MOUTH EVERY DAY 90 capsule 3  . sertraline (ZOLOFT) 50 MG tablet Take 1.5 tablets daily 135 tablet 1  . triamterene-hydrochlorothiazide (MAXZIDE-25) 37.5-25 MG tablet Take 1 tablet by mouth daily. (Patient taking differently: Take 0.5 tablets by mouth daily. ) 90 tablet 3   No current facility-administered medications on file prior to visit.       Review of Systems    Review of  Systems  Constitutional: Negative for fever, appetite change,  and unexpected weight change.  ENT pos for pnd/ neg for ear or sinus pain  Eyes: Negative for pain and visual disturbance.  Respiratory: Negative for wheeze and shortness of breath.   Cardiovascular: Negative for cp or palpitations    Gastrointestinal: Negative for nausea, diarrhea and constipation.  Genitourinary: Negative for urgency and frequency.  Skin:  Negative for pallor or rash   Neurological: Negative for weakness, light-headedness, numbness and headaches.  Hematological: Negative for adenopathy. Does not bruise/bleed easily.  Psychiatric/Behavioral: Negative for dysphoric mood. The patient is not nervous/anxious.      Objective:   Physical Exam  Constitutional: She appears well-developed and well-nourished. No distress.  overwt and well appearing   HENT:  Head: Normocephalic and atraumatic.  Right Ear: External ear normal.  Left Ear: External ear normal.  Mouth/Throat: Oropharynx is clear and moist.  Nares are injected and congested   No sinus tenderness TMs are clear Throat Is clear   Baseline hoarse voice   Eyes: Conjunctivae and EOM are normal. Pupils are equal, round, and reactive to light. Right eye exhibits no discharge.  Neck: Normal range of motion. Neck supple.  Cardiovascular: Normal rate, regular rhythm and normal heart sounds.   Pulmonary/Chest: Effort normal and breath sounds normal. No respiratory distress. She has no wheezes. She has no rales. She exhibits tenderness.  Mild L>R anterior chest wall tenderness w/o skin change or crepitus  Abdominal: Soft. Bowel sounds are normal.  Lymphadenopathy:    She has no cervical adenopathy.  Neurological: She is alert. No cranial nerve deficit.  Skin: Skin is warm and dry. No rash noted. No erythema. No pallor.  Prev R buttock abscess is resolved  Psychiatric: She has a normal mood and affect.          Assessment & Plan:   Problem List Items  Addressed This Visit      Other   Hyperglycemia    A1C today-missed last lab appt      Coughing    Acute on chronic-now with low grade temp elevation and malaise Lab today- cbc with diff cxr today-pending radiology review Disc otc cough medications and fluids Update if not starting to improve in a week or if worsening        Relevant Orders   CBC with Differential/Platelet   DG Chest 2 View (Completed)   Abscess of buttock, right    This looks to be resolved after abx on exam      Abnormal TSH    Lab today- re check tsh and free T4       Other Visit Diagnoses    Temperature elevation    -  Primary   Relevant Orders   CBC with Differential/Platelet   DG Chest 2 View (Completed)   Need for hepatitis C screening test

## 2016-05-28 NOTE — Telephone Encounter (Signed)
Pt notified of xray results and Dr. Marliss Coots comments, Rx sent to pharmacy and f/u appt scheduled

## 2016-05-28 NOTE — Assessment & Plan Note (Signed)
A1C today-missed last lab appt

## 2016-05-28 NOTE — Assessment & Plan Note (Signed)
This looks to be resolved after abx on exam

## 2016-05-28 NOTE — Assessment & Plan Note (Signed)
Acute on chronic-now with low grade temp elevation and malaise Lab today- cbc with diff cxr today-pending radiology review Disc otc cough medications and fluids Update if not starting to improve in a week or if worsening

## 2016-05-28 NOTE — Telephone Encounter (Signed)
Addressed through result notes  

## 2016-05-28 NOTE — Telephone Encounter (Signed)
Pt returned your call.  

## 2016-05-28 NOTE — Patient Instructions (Signed)
Get labs today  Chest xray today  Continue mucinex if you feel like there is congestion there Warm compress on chest if needed   Lets get results and make a plan from there

## 2016-05-28 NOTE — Assessment & Plan Note (Signed)
Lab today- re check tsh and free T4

## 2016-05-28 NOTE — Telephone Encounter (Signed)
-----   Message from Abner Greenspan, MD sent at 05/28/2016  1:48 PM EDT ----- There is an area on cxr - infiltrate cannot be ruled out  Please call in azithromycin 250 mg take 2 pills today and 1 pill daily for 4 days #6 no refills  F/u with me in about 2-3 wk for re check and we will repeat the cxr  Thanks  F/u earlier if worse or no improvement

## 2016-05-29 LAB — HEPATITIS C ANTIBODY: HCV Ab: NEGATIVE

## 2016-05-30 ENCOUNTER — Other Ambulatory Visit: Payer: Self-pay | Admitting: Psychiatry

## 2016-06-05 ENCOUNTER — Other Ambulatory Visit: Payer: Medicare Other

## 2016-06-12 ENCOUNTER — Ambulatory Visit
Admission: RE | Admit: 2016-06-12 | Discharge: 2016-06-12 | Disposition: A | Discharge status: Home / Self Care | Payer: Medicare Other | Source: Ambulatory Visit | Attending: Family Medicine | Admitting: Family Medicine

## 2016-06-12 DIAGNOSIS — Z1231 Encounter for screening mammogram for malignant neoplasm of breast: Secondary | ICD-10-CM | POA: Insufficient documentation

## 2016-06-13 ENCOUNTER — Encounter: Payer: Self-pay | Admitting: Family Medicine

## 2016-06-13 ENCOUNTER — Ambulatory Visit (INDEPENDENT_AMBULATORY_CARE_PROVIDER_SITE_OTHER)
Admission: RE | Admit: 2016-06-13 | Discharge: 2016-06-13 | Disposition: A | Discharge status: Home / Self Care | Payer: Medicare Other | Source: Ambulatory Visit | Attending: Family Medicine | Admitting: Family Medicine

## 2016-06-13 ENCOUNTER — Ambulatory Visit (INDEPENDENT_AMBULATORY_CARE_PROVIDER_SITE_OTHER): Payer: Medicare Other | Admitting: Family Medicine

## 2016-06-13 VITALS — BP 128/78 | HR 76 | Temp 98.0°F | Ht 64.0 in | Wt 183.8 lb

## 2016-06-13 DIAGNOSIS — I10 Essential (primary) hypertension: Secondary | ICD-10-CM

## 2016-06-13 DIAGNOSIS — R7989 Other specified abnormal findings of blood chemistry: Secondary | ICD-10-CM

## 2016-06-13 DIAGNOSIS — Z23 Encounter for immunization: Secondary | ICD-10-CM | POA: Diagnosis not present

## 2016-06-13 DIAGNOSIS — K219 Gastro-esophageal reflux disease without esophagitis: Secondary | ICD-10-CM | POA: Diagnosis not present

## 2016-06-13 DIAGNOSIS — J189 Pneumonia, unspecified organism: Secondary | ICD-10-CM | POA: Diagnosis not present

## 2016-06-13 DIAGNOSIS — R739 Hyperglycemia, unspecified: Secondary | ICD-10-CM | POA: Diagnosis not present

## 2016-06-13 MED ORDER — OMEPRAZOLE 20 MG PO CPDR: DELAYED_RELEASE_CAPSULE | ORAL | 3 refills | Status: DC

## 2016-06-13 NOTE — Progress Notes (Signed)
Subjective:    Patient ID: [name], female    DOB: Apr 11, 1946, 70 y.o.   MRN: [id number]  HPI Here for f/u   Seen for uri earlier this month  Dg Chest 2 View  Result Date: 05/28/2016 CLINICAL DATA:  Recent nonproductive cough, acute on chronic with chest wall soreness and low grade temperature EXAM: CHEST  2 VIEW COMPARISON:  None. FINDINGS: There is mild opacity overlying the lingula which may relate to mild atelectasis or small infiltrate. No lobar consolidation or effusion. Cardiomediastinal silhouette normal. Mild atherosclerosis of the aortic arch. No pneumothorax. No acute osseous abnormality. IMPRESSION: 1. Mild streaky opacity overlies the lingula, this may reflect atelectasis or small infiltrate. Radiographic follow-up recommended. 2. Otherwise negative two view chest Electronically Signed   By: Donavan Foil M.D.   On: 05/28/2016 12:16   Mm Screening Breast Tomo Bilateral  Result Date: 06/12/2016 CLINICAL DATA:  Screening. EXAM: 2D DIGITAL SCREENING BILATERAL MAMMOGRAM WITH CAD AND ADJUNCT TOMO COMPARISON:  Previous exam(s). ACR Breast Density Category b: There are scattered areas of fibroglandular density. FINDINGS: There are no findings suspicious for malignancy. Images were processed with CAD. IMPRESSION: No mammographic evidence of malignancy. A result letter of this screening mammogram will be mailed directly to the patient. RECOMMENDATION: Screening mammogram in one year. (Code:SM-B-01Y) BI-RADS CATEGORY  1: Negative. Electronically Signed   By: Lajean Manes M.D.   On: 06/12/2016 11:57     Treated with azithromycin for poss infiltrate  Starting to feel better  No fever since the zpack  Is due for a re check of CXR for opacity over the lingula Less coughing     Lab Results  Component Value Date   WBC 10.7 (H) 05/28/2016   HGB 14.5 05/28/2016   HCT 42.1 05/28/2016   MCV 86.1 05/28/2016   PLT 297.0 05/28/2016    Lab Results  Component Value Date   HGBA1C 6.2  05/28/2016  hyperglycemia- watching sugar in diet    Lab Results  Component Value Date   TSH 6.88 (H) 05/28/2016   with free T4 of 0.87 occ fatigued  Never had a goiter   Acid reflux is worse on 20 bid = was prev on 40 in am and 20 pm   bp is stable today  No cp or palpitations or headaches or edema  No side effects to medicines  BP Readings from Last 3 Encounters:  06/13/16 128/78  05/28/16 138/76  05/14/16 110/66      Patient Active Problem List   Diagnosis Date Noted  . CAP (community acquired pneumonia) 06/13/2016  . Coughing 05/28/2016  . Abscess of buttock, right 05/14/2016  . Osteopenia 02/13/2016  . Routine general medical examination at a health care facility 01/23/2016  . Estrogen deficiency 01/23/2016  . Abnormal TSH 01/23/2016  . Anxiety, generalized 02/21/2015  . H/O: HTN (hypertension) 02/21/2015  . Mild depression 02/21/2015  . H/O diabetes mellitus 01/04/2015  . H/O arthritis 01/04/2015  . History of hay fever 01/04/2015  . Encounter for Medicare annual wellness exam 01/14/2013  . Obesity 01/15/2012  . Stress reaction, emotional 12/26/2010  . MENOPAUSAL SYNDROME 10/04/2008  . Hyperglycemia 12/02/2007  . TREMOR 12/02/2007  . DEPRESSION 08/04/2007  . Hyperlipidemia 01/28/2007  . ANXIETY 01/28/2007  . Essential hypertension 01/28/2007  . GERD 01/28/2007  . URINARY INCONTINENCE 01/28/2007  . COLONIC POLYPS, HX OF 01/28/2007  . HERPES ZOSTER 01/20/2007  . DYSPHONIA 01/20/2007  . COUGH, CHRONIC 01/20/2007  . ALLERGY 01/20/2007  Past Medical History:  Diagnosis Date  . Allergic rhinitis    gets allergy shot from Dr. Carlis Abbott  . Anxiety   . Arthritis   . Diabetes mellitus without complication (HCC)    diet controlled  . GERD (gastroesophageal reflux disease)   . History of shingles    at age 80- mild  . Hx of colonic polyp   . Hyperglycemia   . Hyperlipemia    Very high chol intol of statins-does not want to check it  . Hypertension   .  Panic disorder   . Tremor    from anx  . Urinary incontinence    mixed  . Voice disorder    Past Surgical History:  Procedure Laterality Date  . ABDOMINAL HYSTERECTOMY    . BLADDER SUSPENSION    . EYE SURGERY Bilateral 2000   eye lens replacement  . JOINT REPLACEMENT     knee replacement - march 15  . KNEE ARTHROSCOPY Left 04/26/2016   Procedure: ARTHROSCOPY KNEE;  Surgeon: Hessie Knows, MD;  Location: [facility];  Service: Orthopedics;  Laterality: Left;  Marland Kitchen MEDIAL PARTIAL KNEE REPLACEMENT Left 2016  . NASAL SINUS SURGERY    . PARTIAL HYSTERECTOMY     with fibroid  . RECTOCELE REPAIR    . SYNOVECTOMY Left 04/26/2016   Procedure: PARTIAL SYNOVECTOMY;  Surgeon: Hessie Knows, MD;  Location: [facility];  Service: Orthopedics;  Laterality: Left;  . TONSILLECTOMY     Social History  Substance Use Topics  . Smoking status: Never Smoker  . Smokeless tobacco: Never Used  . Alcohol use No   Family History  Problem Relation Age of Onset  . Diabetes Mother   . Hyperlipidemia Mother   . Hypertension Mother   . Osteoarthritis Mother   . Cancer Father     Colon  . Leukemia Sister   . Syncope episode Daughter    Allergies  Allergen Reactions  . Atorvastatin     REACTION: shoulder pain  . Ezetimibe     REACTION: ?  . Lisinopril     REACTION: muscle pain  . Molds & Smuts   . Penicillins     REACTION: reaction not known Has patient had a PCN reaction causing immediate rash, facial/tongue/throat swelling, SOB or lightheadedness with hypotension: unknown Has patient had a PCN reaction causing severe rash involving mucus membranes or skin necrosis: unknown Has patient had a PCN reaction that required hospitalization unsure Has patient had a PCN reaction occurring within the last 10 years: no If all of the above answers are "NO", then may proceed with Cephalosporin use.  . Pneumococcal Vaccine Swelling    REACTION: severe local reaction  . Pneumococcal Vaccine Polyvalent      REACTION: severe local reaction  . Pollen Extract   . Rosuvastatin     REACTION: leg pain  . Simvastatin     REACTION: muscle pain   Current Outpatient Prescriptions on File Prior to Visit  Medication Sig Dispense Refill  . ALPRAZolam (XANAX) 0.5 MG tablet Take 1 tablet (0.5 mg total) by mouth at bedtime as needed for anxiety. 90 tablet 0  . amLODipine (NORVASC) 5 MG tablet Take 1 tablet (5 mg total) by mouth daily. 90 tablet 3  . Azelastine HCl (ASTEPRO) 0.15 % SOLN 2 sprays by Nasal route 2 (two) times daily as needed. 30 mL 11  . Calcium Carbonate-Vitamin D (CALCIUM 600+D PO) Take 1 tablet by mouth 2 (two) times daily.     . cetirizine (  ZYRTEC) 10 MG tablet Take 10 mg by mouth daily.      Marland Kitchen EPINEPHrine 0.3 mg/0.3 mL IJ SOAJ injection Inject 0.3 mg into the skin once. as needed.    Marland Kitchen ibuprofen (ADVIL,MOTRIN) 200 MG tablet Take 200 mg by mouth every 6 (six) hours as needed for headache or moderate pain.    . Multiple Vitamin (MULTIVITAMIN) tablet Take 1 tablet by mouth daily.      . Omega-3 Fatty Acids (FISH OIL) 1200 MG CAPS Take 1 capsule by mouth daily.     . ramipril (ALTACE) 10 MG capsule TAKE 1 CAPSULE BY MOUTH EVERY DAY 90 capsule 3  . sertraline (ZOLOFT) 50 MG tablet Take 1.5 tablets daily 135 tablet 1  . triamterene-hydrochlorothiazide (MAXZIDE-25) 37.5-25 MG tablet Take 1 tablet by mouth daily. (Patient taking differently: Take 0.5 tablets by mouth daily. ) 90 tablet 3   No current facility-administered medications on file prior to visit.     Review of Systems    Review of Systems  Constitutional: Negative for fever, appetite change, fatigue and unexpected weight change.  Eyes: Negative for pain and visual disturbance.  Respiratory: Negative for wheeze and shortness of breath.  pos for mild occ cough that is improving  Cardiovascular: Negative for cp or palpitations    Gastrointestinal: Negative for nausea, diarrhea and constipation.  Genitourinary: Negative for urgency and  frequency.  Skin: Negative for pallor or rash   Neurological: Negative for weakness, light-headedness, numbness and headaches.  Hematological: Negative for adenopathy. Does not bruise/bleed easily.  Psychiatric/Behavioral: Negative for dysphoric mood. The patient is not nervous/anxious.      Objective:   Physical Exam  Constitutional: She appears well-developed and well-nourished. No distress.  overwt and well appearing   HENT:  Head: Normocephalic and atraumatic.  Mouth/Throat: Oropharynx is clear and moist.  Baseline dysphonia  Eyes: Conjunctivae and EOM are normal. Pupils are equal, round, and reactive to light.  Neck: Normal range of motion. Neck supple. No JVD present. Carotid bruit is not present. No thyromegaly present.  Cardiovascular: Normal rate, regular rhythm, normal heart sounds and intact distal pulses.  Exam reveals no gallop.   Pulmonary/Chest: Effort normal and breath sounds normal. No respiratory distress. She has no wheezes. She has no rales. She exhibits no tenderness.  No crackles  Good air exch  Abdominal: Soft. Bowel sounds are normal. She exhibits no distension, no abdominal bruit and no mass. There is no tenderness.  Musculoskeletal: She exhibits no edema.  Lymphadenopathy:    She has no cervical adenopathy.  Neurological: She is alert. She has normal reflexes. No cranial nerve deficit. She exhibits normal muscle tone. Coordination normal.  Skin: Skin is warm and dry. No rash noted. No pallor.  Psychiatric: She has a normal mood and affect.          Assessment & Plan:   Problem List Items Addressed This Visit      Cardiovascular and Mediastinum   Essential hypertension    bp in fair control at this time  BP Readings from Last 1 Encounters:  06/13/16 128/78   No changes needed Disc lifstyle change with low sodium diet and exercise          Respiratory   CAP (community acquired pneumonia) - Primary    Clinically improved Rev CXR - with  streaky opacity in lingula Re assuring exam  Finished zpack  Re check cxr today  Unfortunately pt cannot have pneumovax due to severe local rxn last time  Relevant Orders   DG Chest 2 View     Digestive   GERD    Not as well controlled Will go back to omeprazole 40 in am and 20 in pm  Disc GERD diet and enc wt loss       Relevant Medications   omeprazole (PRILOSEC) 20 MG capsule     Other   Abnormal TSH    Mildly elevated tsh with nl FT4 Will continue to follow  May become hypothyroid in the future  Not symptomatic       Hyperglycemia    Lab Results  Component Value Date   HGBA1C 6.2 05/28/2016   Urged low glycemic diet and wt loss        Other Visit Diagnoses    Need for influenza vaccination       Relevant Orders   Flu Vaccine QUAD 36+ mos PF IM (Fluarix & Fluzone Quad PF) (Completed)

## 2016-06-13 NOTE — Assessment & Plan Note (Signed)
Lab Results  Component Value Date   HGBA1C 6.2 05/28/2016   Urged low glycemic diet and wt loss

## 2016-06-13 NOTE — Assessment & Plan Note (Signed)
Mildly elevated tsh with nl FT4 Will continue to follow  May become hypothyroid in the future  Not symptomatic

## 2016-06-13 NOTE — Assessment & Plan Note (Signed)
bp in fair control at this time  BP Readings from Last 1 Encounters:  06/13/16 128/78   No changes needed Disc lifstyle change with low sodium diet and exercise

## 2016-06-13 NOTE — Patient Instructions (Addendum)
Flu shot today  Go back to the omeprazole 40 mg in am and 20 mg in pm  I'm glad you are feeling better  Re check chest xray today  We will continue to watch thyroid

## 2016-06-13 NOTE — Assessment & Plan Note (Signed)
Not as well controlled Will go back to omeprazole 40 in am and 20 in pm  Disc GERD diet and enc wt loss

## 2016-06-13 NOTE — Assessment & Plan Note (Signed)
Clinically improved Rev CXR - with streaky opacity in lingula Re assuring exam  Finished zpack  Re check cxr today  Unfortunately pt cannot have pneumovax due to severe local rxn last time

## 2016-06-13 NOTE — Progress Notes (Signed)
Pre visit review using our clinic review tool, if applicable. No additional management support is needed unless otherwise documented below in the visit note. 

## 2016-06-26 ENCOUNTER — Encounter: Payer: Self-pay | Admitting: Psychiatry

## 2016-06-26 ENCOUNTER — Ambulatory Visit (INDEPENDENT_AMBULATORY_CARE_PROVIDER_SITE_OTHER): Payer: 59 | Admitting: Psychiatry

## 2016-06-26 VITALS — BP 132/81 | HR 82 | Temp 98.4°F | Wt 186.2 lb

## 2016-06-26 DIAGNOSIS — F331 Major depressive disorder, recurrent, moderate: Secondary | ICD-10-CM

## 2016-06-26 DIAGNOSIS — F411 Generalized anxiety disorder: Secondary | ICD-10-CM

## 2016-06-26 MED ORDER — SERTRALINE HCL 50 MG PO TABS: 75.0000 mg | ORAL_TABLET | Freq: Every day | ORAL | 1 refills | Status: DC

## 2016-06-26 MED ORDER — ALPRAZOLAM 0.5 MG PO TABS: 0.5000 mg | ORAL_TABLET | Freq: Every evening | ORAL | 1 refills | Status: DC | PRN

## 2016-06-26 NOTE — Progress Notes (Signed)
BH MD/PA/NP OP Progress Note  06/26/2016 10:42 AM [name]  MRN:  [id number]  Subjective:  Patient is a 70 year-old female who presented for the follow-up appointment. She reported that she has been doing well since she was started on Xanax. She is able to sleep well throughout the night. She reported that she is not having any side effects from the medications. She used to wake up 2-3 tablets at night but now she is able to sleep throughout the night. She appeared calm and alert during the interview. She is also compliant with her Zoloft. She spends times at home and has been watching TV and going around the neighborhood and doing her usual stuff. She appears well  Patient denied having any adverse reactions to the medications. We discussed about her medications at length. She reported that she has been following with her family care physician on a regular basis. She denied having any suicidal homicidal ideations or plans. She remains concerned about her knee pain and was discussing that in detail.  She denied having any suicidal ideations or plans.     Chief Complaint:  Chief Complaint    Follow-up; Medication Refill     Visit Diagnosis:     ICD-9-CM ICD-10-CM   1. MDD (major depressive disorder), recurrent episode, moderate (HCC) 296.32 F33.1   2. Generalized anxiety disorder 300.02 F41.1     Past Medical History:  Past Medical History:  Diagnosis Date  . Allergic rhinitis    gets allergy shot from Dr. Carlis Abbott  . Anxiety   . Arthritis   . Diabetes mellitus without complication (HCC)    diet controlled  . GERD (gastroesophageal reflux disease)   . History of shingles    at age 65- mild  . Hx of colonic polyp   . Hyperglycemia   . Hyperlipemia    Very high chol intol of statins-does not want to check it  . Hypertension   . Panic disorder   . Tremor    from anx  . Urinary incontinence    mixed  . Voice disorder     Past Surgical History:  Procedure Laterality Date   . ABDOMINAL HYSTERECTOMY    . BLADDER SUSPENSION    . EYE SURGERY Bilateral 2000   eye lens replacement  . JOINT REPLACEMENT     knee replacement - march 15  . KNEE ARTHROSCOPY Left 04/26/2016   Procedure: ARTHROSCOPY KNEE;  Surgeon: Hessie Knows, MD;  Location: [facility] ORS;  Service: Orthopedics;  Laterality: Left;  Marland Kitchen MEDIAL PARTIAL KNEE REPLACEMENT Left 2016  . NASAL SINUS SURGERY    . PARTIAL HYSTERECTOMY     with fibroid  . RECTOCELE REPAIR    . SYNOVECTOMY Left 04/26/2016   Procedure: PARTIAL SYNOVECTOMY;  Surgeon: Hessie Knows, MD;  Location: [facility] ORS;  Service: Orthopedics;  Laterality: Left;  . TONSILLECTOMY     Family History:  Family History  Problem Relation Age of Onset  . Diabetes Mother   . Hyperlipidemia Mother   . Hypertension Mother   . Osteoarthritis Mother   . Cancer Father     Colon  . Leukemia Sister   . Syncope episode Daughter    Social History:  Social History   Social History  . Marital status: Married    Spouse name: N/A  . Number of children: 2  . 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 Smoking status: Never Smoker  . Smokeless tobacco: Never Used  . Alcohol use No  . Drug use: No  . Sexual activity: No   Other Topics Concern  . None   Social History Narrative  . None   Additional History:  Lives with husband. She stated that she spends time by exercise and doing hosehold chores. She stated that she is having issues due to knee replacement now.   Assessment:   Musculoskeletal: Strength & Muscle Tone: within normal limits Gait & Station: normal Patient leans: N/A  Psychiatric Specialty Exam: HPI  Review of Systems  Constitutional: Negative for weight loss.  HENT: Negative for hearing loss.   Eyes: Negative for photophobia.  Respiratory: Negative for hemoptysis.   Cardiovascular: Negative for palpitations.  Gastrointestinal: Negative  for vomiting.  Genitourinary: Negative for urgency.  Musculoskeletal: Positive for back pain, joint pain and myalgias.  Neurological: Negative for tremors.  Endo/Heme/Allergies: Negative for environmental allergies.  Psychiatric/Behavioral: Negative for depression and hallucinations. The patient has insomnia. The patient is not nervous/anxious.   All other systems reviewed and are negative.   Blood pressure 132/81, pulse 82, temperature 98.4 F (36.9 C), temperature source Oral, weight 186 lb 3.2 oz (84.5 kg), last menstrual period 09/18/1987.Body mass index is 31.96 kg/m.  General Appearance: Casual  Eye Contact:  Fair  Speech:  Clear and Coherent  Volume:  Normal  Mood:  Anxious  Affect:  Appropriate  Thought Process:  Logical  Orientation:  Full (Time, Place, and Person)  Thought Content:  WDL  Suicidal Thoughts:  No  Homicidal Thoughts:  No  Memory:  Immediate;   Good  Judgement:  Fair  Insight:  Fair  Psychomotor Activity:  Normal  Concentration:  Fair  Recall:  [employer] of Knowledge: Fair  Language: Fair  Akathisia:  No  Handed:  Right  AIMS (if indicated):  none  Assets:  Communication Skills  ADL's:  Intact  Cognition: WNL  Sleep:  5-6    Is the patient at risk to self?  No. Has the patient been a risk to self in the past 6 months?  No. Has the patient been a risk to self within the distant past?  No. Is the patient a risk to others?  No. Has the patient been a risk to others in the past 6 months?  No.   Has the patient been a risk to others within the distant past?  No.  Current Medications: Current Outpatient Prescriptions  Medication Sig Dispense Refill  . ALPRAZolam (XANAX) 0.5 MG tablet Take 1 tablet (0.5 mg total) by mouth at bedtime as needed for anxiety. 90 tablet 1  . amLODipine (NORVASC) 5 MG tablet Take 1 tablet (5 mg total) by mouth daily. 90 tablet 3  . Azelastine HCl (ASTEPRO) 0.15 % SOLN 2 sprays by Nasal route 2 (two) times daily as needed.  30 mL 11  . Calcium Carbonate-Vitamin D (CALCIUM 600+D PO) Take 1 tablet by mouth 2 (two) times daily.     . cetirizine (ZYRTEC) 10 MG tablet Take 10 mg by mouth daily.      Marland Kitchen EPINEPHrine 0.3 mg/0.3 mL IJ SOAJ injection Inject 0.3 mg into the skin once. as needed.    Marland Kitchen ibuprofen (ADVIL,MOTRIN) 200 MG tablet Take 200 mg by mouth every 6 (six) hours as needed for headache or moderate pain.    . Multiple Vitamin (MULTIVITAMIN) tablet Take 1 tablet by mouth daily.      Marland Kitchen  Omega-3 Fatty Acids (FISH OIL) 1200 MG CAPS Take 1 capsule by mouth daily.     Marland Kitchen omeprazole (PRILOSEC) 20 MG capsule Take 2 pills by mouth each am and one each pm 270 capsule 3  . ramipril (ALTACE) 10 MG capsule TAKE 1 CAPSULE BY MOUTH EVERY DAY 90 capsule 3  . sertraline (ZOLOFT) 50 MG tablet Take 1.5 tablets (75 mg total) by mouth daily. 135 tablet 1  . triamterene-hydrochlorothiazide (MAXZIDE-25) 37.5-25 MG tablet Take 1 tablet by mouth daily. (Patient taking differently: Take 0.5 tablets by mouth daily. ) 90 tablet 3   No current facility-administered medications for this visit.     Medical Decision Making:  Established Problem, Stable/Improving (1)  Treatment Plan Summary:Medication management   Discussed with patient about her medications and   She will continue her on the medications as follows Zoloft 75 mg by mouth daily  Prescribed Xanax  0.5  milligram by mouth  qhs when necessary basis.  Patient will be given six-month supply of the medications. I advised her that I will be leaving office in end of November and she demonstrated understanding.   Follow-up in 6  months    More than 50% of the time spent in psychoeducation, counseling and coordination of care.    This note was generated in part or whole with voice recognition software. Voice regonition is usually quite accurate but there are transcription errors that can and very often do occur. I apologize for any typographical errors that were not detected and  corrected.   Rainey Pines, MD  06/26/2016, 10:42 AM

## 2016-09-03 ENCOUNTER — Other Ambulatory Visit: Payer: Self-pay | Admitting: Psychiatry

## 2016-12-21 ENCOUNTER — Encounter: Payer: Self-pay | Admitting: *Deleted

## 2016-12-24 ENCOUNTER — Ambulatory Visit: Payer: Medicare Other | Admitting: Certified Registered Nurse Anesthetist

## 2016-12-24 ENCOUNTER — Encounter: Payer: Self-pay | Admitting: *Deleted

## 2016-12-24 ENCOUNTER — Ambulatory Visit
Admission: RE | Admit: 2016-12-24 | Discharge: 2016-12-24 | Disposition: A | Discharge status: Home / Self Care | Payer: Medicare Other | Source: Ambulatory Visit | Attending: Unknown Physician Specialty | Admitting: Unknown Physician Specialty

## 2016-12-24 ENCOUNTER — Encounter
Admission: RE | Disposition: A | Discharge status: Home / Self Care | Payer: Self-pay | Source: Ambulatory Visit | Attending: Unknown Physician Specialty

## 2016-12-24 DIAGNOSIS — E119 Type 2 diabetes mellitus without complications: Secondary | ICD-10-CM | POA: Diagnosis not present

## 2016-12-24 DIAGNOSIS — F329 Major depressive disorder, single episode, unspecified: Secondary | ICD-10-CM | POA: Insufficient documentation

## 2016-12-24 DIAGNOSIS — K3189 Other diseases of stomach and duodenum: Secondary | ICD-10-CM | POA: Insufficient documentation

## 2016-12-24 DIAGNOSIS — R1310 Dysphagia, unspecified: Secondary | ICD-10-CM | POA: Insufficient documentation

## 2016-12-24 DIAGNOSIS — K219 Gastro-esophageal reflux disease without esophagitis: Secondary | ICD-10-CM | POA: Diagnosis not present

## 2016-12-24 DIAGNOSIS — Z1211 Encounter for screening for malignant neoplasm of colon: Secondary | ICD-10-CM | POA: Diagnosis not present

## 2016-12-24 DIAGNOSIS — I10 Essential (primary) hypertension: Secondary | ICD-10-CM | POA: Insufficient documentation

## 2016-12-24 DIAGNOSIS — D122 Benign neoplasm of ascending colon: Secondary | ICD-10-CM | POA: Diagnosis not present

## 2016-12-24 DIAGNOSIS — Z791 Long term (current) use of non-steroidal anti-inflammatories (NSAID): Secondary | ICD-10-CM | POA: Insufficient documentation

## 2016-12-24 DIAGNOSIS — K449 Diaphragmatic hernia without obstruction or gangrene: Secondary | ICD-10-CM | POA: Insufficient documentation

## 2016-12-24 DIAGNOSIS — M1990 Unspecified osteoarthritis, unspecified site: Secondary | ICD-10-CM | POA: Insufficient documentation

## 2016-12-24 DIAGNOSIS — Z800 Family history of malignant neoplasm of digestive organs: Secondary | ICD-10-CM | POA: Insufficient documentation

## 2016-12-24 DIAGNOSIS — Z90710 Acquired absence of both cervix and uterus: Secondary | ICD-10-CM | POA: Insufficient documentation

## 2016-12-24 DIAGNOSIS — E785 Hyperlipidemia, unspecified: Secondary | ICD-10-CM | POA: Diagnosis not present

## 2016-12-24 DIAGNOSIS — F419 Anxiety disorder, unspecified: Secondary | ICD-10-CM | POA: Insufficient documentation

## 2016-12-24 DIAGNOSIS — K648 Other hemorrhoids: Secondary | ICD-10-CM | POA: Insufficient documentation

## 2016-12-24 HISTORY — PX: COLONOSCOPY WITH PROPOFOL: SHX5780

## 2016-12-24 HISTORY — PX: ESOPHAGOGASTRODUODENOSCOPY (EGD) WITH PROPOFOL: SHX5813

## 2016-12-24 SURGERY — COLONOSCOPY WITH PROPOFOL
Anesthesia: General

## 2016-12-24 MED ORDER — GENTAMICIN IN SALINE 1.6-0.9 MG/ML-% IV SOLN
80.0000 mg | Freq: Once | INTRAVENOUS | Status: DC
  Filled 2016-12-24 (×3): qty 50

## 2016-12-24 MED ORDER — SODIUM CHLORIDE 0.9 % IV SOLN
80.0000 mg | Freq: Once | INTRAVENOUS | Status: AC
  Administered 2016-12-24: 80 mg via INTRAVENOUS
  Filled 2016-12-24: qty 2

## 2016-12-24 MED ORDER — PROPOFOL 500 MG/50ML IV EMUL
INTRAVENOUS | Status: AC
  Filled 2016-12-24: qty 50

## 2016-12-24 MED ORDER — SODIUM CHLORIDE 0.9 % IV SOLN
INTRAVENOUS | Status: DC
  Administered 2016-12-24: 07:00:00 via INTRAVENOUS

## 2016-12-24 MED ORDER — LIDOCAINE HCL (PF) 2 % IJ SOLN
INTRAMUSCULAR | Status: AC
  Filled 2016-12-24: qty 2

## 2016-12-24 MED ORDER — VANCOMYCIN HCL IN DEXTROSE 1-5 GM/200ML-% IV SOLN
1000.0000 mg | Freq: Once | INTRAVENOUS | Status: AC
  Administered 2016-12-24: 1000 mg via INTRAVENOUS

## 2016-12-24 MED ORDER — MIDAZOLAM HCL 2 MG/2ML IJ SOLN
INTRAMUSCULAR | Status: DC | PRN
  Administered 2016-12-24: 2 mg via INTRAVENOUS

## 2016-12-24 MED ORDER — PHENYLEPHRINE HCL 10 MG/ML IJ SOLN
INTRAMUSCULAR | Status: DC | PRN
Start: 2016-12-24 — End: 2016-12-24
  Administered 2016-12-24: 100 ug via INTRAVENOUS

## 2016-12-24 MED ORDER — GLYCOPYRROLATE 0.2 MG/ML IJ SOLN
INTRAMUSCULAR | Status: DC | PRN
  Administered 2016-12-24: 0.2 mg via INTRAVENOUS

## 2016-12-24 MED ORDER — PROPOFOL 10 MG/ML IV BOLUS
INTRAVENOUS | Status: DC | PRN
  Administered 2016-12-24: 10 mg via INTRAVENOUS
  Administered 2016-12-24: 30 mg via INTRAVENOUS
  Administered 2016-12-24: 20 mg via INTRAVENOUS

## 2016-12-24 MED ORDER — SODIUM CHLORIDE 0.9 % IV SOLN: INTRAVENOUS | Status: DC

## 2016-12-24 MED ORDER — PROPOFOL 500 MG/50ML IV EMUL
INTRAVENOUS | Status: DC | PRN
  Administered 2016-12-24: 140 ug/kg/min via INTRAVENOUS

## 2016-12-24 MED ORDER — PROPOFOL 10 MG/ML IV BOLUS
INTRAVENOUS | Status: AC
  Filled 2016-12-24: qty 20

## 2016-12-24 MED ORDER — MIDAZOLAM HCL 2 MG/2ML IJ SOLN
INTRAMUSCULAR | Status: AC
  Filled 2016-12-24: qty 2

## 2016-12-24 MED ORDER — LIDOCAINE HCL (CARDIAC) 20 MG/ML IV SOLN
INTRAVENOUS | Status: DC | PRN
  Administered 2016-12-24: 30 mg via INTRAVENOUS

## 2016-12-24 MED ORDER — GLYCOPYRROLATE 0.2 MG/ML IJ SOLN
INTRAMUSCULAR | Status: AC
  Filled 2016-12-24: qty 1

## 2016-12-24 NOTE — H&P (Signed)
Primary Care Physician:  Loura Pardon, MD Primary Gastroenterologist:  Dr. Vira Agar  Pre-Procedure History & Physical: HPI:  [name] is a 71 y.o. female is here for an endoscopy and colonoscopy.   Past Medical History:  Diagnosis Date  . Allergic rhinitis    gets allergy shot from Dr. Carlis Abbott  . Anxiety   . Arthritis   . Diabetes mellitus without complication (HCC)    diet controlled  . GERD (gastroesophageal reflux disease)   . History of shingles    at age 50- mild  . Hx of colonic polyp   . Hyperglycemia   . Hyperlipemia    Very high chol intol of statins-does not want to check it  . Hypertension   . Panic disorder   . Tremor    from anx  . Urinary incontinence    mixed  . Voice disorder     Past Surgical History:  Procedure Laterality Date  . ABDOMINAL HYSTERECTOMY    . BLADDER SUSPENSION    . EYE SURGERY Bilateral 2000   eye lens replacement  . HAND SURGERY Left 2016   left long trigger finger release  . JOINT REPLACEMENT     knee replacement - march 15  . KNEE ARTHROSCOPY Left 04/26/2016   Procedure: ARTHROSCOPY KNEE;  Surgeon: Hessie Knows, MD;  Location: [facility] ORS;  Service: Orthopedics;  Laterality: Left;  Marland Kitchen MEDIAL PARTIAL KNEE REPLACEMENT Left 2016  . NASAL SINUS SURGERY    . PARTIAL HYSTERECTOMY     with fibroid  . RECTOCELE REPAIR    . SYNOVECTOMY Left 04/26/2016   Procedure: PARTIAL SYNOVECTOMY;  Surgeon: Hessie Knows, MD;  Location: [facility] ORS;  Service: Orthopedics;  Laterality: Left;  . TONSILLECTOMY      Prior to Admission medications   Medication Sig Start Date End Date Taking? Authorizing Provider  ALPRAZolam Duanne Moron) 0.5 MG tablet Take 1 tablet (0.5 mg total) by mouth at bedtime as needed for anxiety. 06/26/16  Yes Rainey Pines, MD  amLODipine (NORVASC) 5 MG tablet Take 1 tablet (5 mg total) by mouth daily. 01/23/16  Yes Abner Greenspan, MD  Calcium Carbonate-Vitamin D (CALCIUM 600+D PO) Take 1 tablet by mouth 2 (two) times daily.    Yes  Historical Provider, MD  diclofenac sodium (VOLTAREN) 1 % GEL Apply 2 g topically 2 (two) times daily.   Yes Historical Provider, MD  Multiple Vitamin (MULTIVITAMIN) tablet Take 1 tablet by mouth daily.     Yes Historical Provider, MD  Omega-3 Fatty Acids (FISH OIL) 1200 MG CAPS Take 1 capsule by mouth daily.    Yes Historical Provider, MD  omeprazole (PRILOSEC) 20 MG capsule Take 2 pills by mouth each am and one each pm 06/13/16  Yes Abner Greenspan, MD  ramipril (ALTACE) 10 MG capsule TAKE 1 CAPSULE BY MOUTH EVERY DAY 01/23/16  Yes Abner Greenspan, MD  sertraline (ZOLOFT) 50 MG tablet Take 1.5 tablets (75 mg total) by mouth daily. 06/26/16  Yes Rainey Pines, MD  triamterene-hydrochlorothiazide (MAXZIDE-25) 37.5-25 MG tablet Take 1 tablet by mouth daily. Patient taking differently: Take 0.5 tablets by mouth daily.  01/23/16  Yes Abner Greenspan, MD  Azelastine HCl (ASTEPRO) 0.15 % SOLN 2 sprays by Nasal route 2 (two) times daily as needed. 12/26/10   Abner Greenspan, MD  cetirizine (ZYRTEC) 10 MG tablet Take 10 mg by mouth daily.      Historical Provider, MD  EPINEPHrine 0.3 mg/0.3 mL IJ SOAJ injection Inject 0.3  mg into the skin once. as needed. 10/22/14   Historical Provider, MD  ibuprofen (ADVIL,MOTRIN) 200 MG tablet Take 200 mg by mouth every 6 (six) hours as needed for headache or moderate pain.    Historical Provider, MD    Allergies as of 11/08/2016 - Review Complete 06/26/2016  Allergen Reaction Noted  . Atorvastatin  01/20/2007  . Ezetimibe  08/04/2007  . Lisinopril  01/20/2007  . Molds & smuts  01/04/2015  . Penicillins  01/20/2007  . Pneumococcal vaccine Swelling 02/21/2015  . Pneumococcal vaccine polyvalent  03/23/2009  . Pollen extract  01/04/2015  . Rosuvastatin  01/20/2007  . Simvastatin  01/20/2007    Family History  Problem Relation Age of Onset  . Diabetes Mother   . Hyperlipidemia Mother   . Hypertension Mother   . Osteoarthritis Mother   . Cancer Father     Colon  . Leukemia  Sister   . Syncope episode Daughter     Social History   Social History  . Marital status: Married    Spouse name: N/A  . Number of children: 2  . 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 Smoking status: Never Smoker  . Smokeless tobacco: Never Used  . Alcohol use No  . Drug use: No  . Sexual activity: No   Other Topics Concern  . Not on file   Social History Narrative  . No narrative on file    Review of Systems: See HPI, otherwise negative ROS  Physical Exam: BP 102/78   Pulse 88   Temp 97.7 F (36.5 C) (Oral)   Resp 16   Ht 5\' 4"  (1.626 m)   Wt 83.5 kg (184 lb)   LMP 09/18/1987   SpO2 98%   BMI 31.58 kg/m  General:   Alert,  pleasant and cooperative in NAD Head:  Normocephalic and atraumatic. Neck:  Supple; no masses or thyromegaly. Lungs:  Clear throughout to auscultation.    Heart:  Regular rate and rhythm. Abdomen:  Soft, nontender and nondistended. Normal bowel sounds, without guarding, and without rebound.   Neurologic:  Alert and  oriented x4;  grossly normal neurologically.  Impression/Plan: [name] is here for an endoscopy and colonoscopy to be performed for dysphagia and family history of colon cancer in father  Risks, benefits, limitations, and alternatives regarding  endoscopy and colonoscopy have been reviewed with the patient.  Questions have been answered.  All parties agreeable.   Gaylyn Cheers, MD  12/24/2016, 9:36 AM

## 2016-12-24 NOTE — Anesthesia Postprocedure Evaluation (Signed)
Anesthesia Post Note  Patient: [name]  Procedure(s) Performed: Procedure(s) (LRB): COLONOSCOPY WITH PROPOFOL (N/A) ESOPHAGOGASTRODUODENOSCOPY (EGD) WITH PROPOFOL (N/A)  Patient location during evaluation: Endoscopy Anesthesia Type: General Level of consciousness: awake and alert and oriented Pain management: pain level controlled Vital Signs Assessment: post-procedure vital signs reviewed and stable Respiratory status: spontaneous breathing, nonlabored ventilation and respiratory function stable Cardiovascular status: blood pressure returned to baseline and stable Postop Assessment: no signs of nausea or vomiting Anesthetic complications: no     Last Vitals:  Vitals:   12/24/16 1000 12/24/16 1010  BP: 96/62 112/66  Pulse: 67 65  Resp: 17 16  Temp:      Last Pain:  Vitals:   12/24/16 0940  TempSrc: Tympanic  PainSc: Asleep                 Arby Dahir

## 2016-12-24 NOTE — Op Note (Addendum)
[facility] Patient Name: [name] Procedure Date: 12/24/2016 8:21 AM MRN: [id number] Account #: [id number] Date of Birth: 05/31/1946 Admit Type: Outpatient Age: 71 Room: [facility] ENDO ROOM 1 Gender: Female Note Status: Finalized Procedure:            Upper GI endoscopy Indications:          Dysphagia Providers:            Manya Silvas, MD Referring MD:         Wynelle Fanny. Tower (Referring MD) Medicines:            Propofol per Anesthesia Complications:        No immediate complications. Procedure:            Pre-Anesthesia Assessment:                       - After reviewing the risks and benefits, the patient                        was deemed in satisfactory condition to undergo the                        procedure.                       After obtaining informed consent, the endoscope was                        passed under direct vision. Throughout the procedure,                        the patient's blood pressure, pulse, and oxygen                        saturations were monitored continuously. The Endoscope                        was introduced through the mouth, and advanced to the                        second part of duodenum. The upper GI endoscopy was                        accomplished without difficulty. The patient tolerated                        the procedure well. Findings:      The examined esophagus was normal. A guidewire was placed and the scope       was withdrawn. Dilation was performed with a Savary dilator with no       resistance at 17 mm. GEJ 40cm.      The entire examined stomach was normal.      A single small nodule was found in the second portion of the duodenum.       Biopsies were taken with a cold forceps for histology.      A small hiatal hernia was present. Impression:           - Normal esophagus. Dilated.                       - Normal stomach.                       -  Nodule found in the duodenum. Biopsied.                       - Small hiatal hernia. Recommendation:       - Await pathology results. Procedure Code(s):    --- Professional ---                       [phone], Esophagogastroduodenoscopy, flexible, transoral;                        with insertion of guide wire followed by passage of                        dilator(s) through esophagus over guide wire                       [id number], Esophagogastroduodenoscopy, flexible, transoral;                        with biopsy, single or multiple Diagnosis Code(s):    --- Professional ---                       K31.89, Other diseases of stomach and duodenum                       K44.9, Diaphragmatic hernia without obstruction or                        gangrene                       R13.10, Dysphagia, unspecified CPT copyright 2016 American Medical Association. All rights reserved. The codes documented in this report are preliminary and upon coder review may  be revised to meet current compliance requirements. Manya Silvas, MD 12/24/2016 9:17:30 AM This report has been signed electronically. Number of Addenda: 0 Note Initiated On: 12/24/2016 8:21 AM      [facility]

## 2016-12-24 NOTE — Transfer of Care (Signed)
Immediate Anesthesia Transfer of Care Note  Patient: [name]  Procedure(s) Performed: Procedure(s): COLONOSCOPY WITH PROPOFOL (N/A) ESOPHAGOGASTRODUODENOSCOPY (EGD) WITH PROPOFOL (N/A)  Patient Location: PACU  Anesthesia Type:General  Level of Consciousness: sedated  Airway & Oxygen Therapy: Patient Spontanous Breathing and Patient connected to nasal cannula oxygen  Post-op Assessment: Report given to RN and Post -op Vital signs reviewed and stable  Post vital signs: Reviewed and stable  Last Vitals:  Vitals:   12/24/16 0939 12/24/16 0940  BP: (!) 87/53   Pulse: 78   Resp: (!) 30   Temp: 36.1 C (P) 36.1 C    Last Pain:  Vitals:   12/24/16 0940  TempSrc: (P) Tympanic         Complications: No apparent anesthesia complications

## 2016-12-24 NOTE — Op Note (Addendum)
[facility] Gastroenterology Patient Name: [name] Procedure Date: 12/24/2016 8:20 AM MRN: [id number] Account #: [id number] Date of Birth: 15-Oct-1945 Admit Type: Outpatient Age: 71 Room: [facility] ENDO ROOM 1 Gender: Female Note Status: Finalized Procedure:            Colonoscopy Indications:          Screening in patient at increased risk: Family history                        of 1st-degree relative with colorectal cancer Providers:            Manya Silvas, MD Referring MD:         Wynelle Fanny. Tower (Referring MD) Medicines:            Propofol per Anesthesia Complications:        No immediate complications. Procedure:            Pre-Anesthesia Assessment:                       - After reviewing the risks and benefits, the patient                        was deemed in satisfactory condition to undergo the                        procedure.                       After obtaining informed consent, the colonoscope was                        passed under direct vision. Throughout the procedure,                        the patient's blood pressure, pulse, and oxygen                        saturations were monitored continuously. The                        Colonoscope was introduced through the anus and                        advanced to the the cecum, identified by appendiceal                        orifice and ileocecal valve. The colonoscopy was                        performed without difficulty. The patient tolerated the                        procedure well. The quality of the bowel preparation                        was good. Findings:      A diminutive polyp was found in the ascending colon. The polyp was       sessile. The polyp was removed with a jumbo cold forceps. Resection and       retrieval were complete.      Internal  hemorrhoids were found during endoscopy. The hemorrhoids were       small and Grade I (internal hemorrhoids that do not prolapse).      The  exam was otherwise without abnormality. Impression:           - One diminutive polyp in the ascending colon, removed                        with a jumbo cold forceps. Resected and retrieved.                       - Internal hemorrhoids.                       - The examination was otherwise normal. Recommendation:       - Await pathology results. Manya Silvas, MD 12/24/2016 9:34:50 AM This report has been signed electronically. Number of Addenda: 0 Note Initiated On: 12/24/2016 8:20 AM Scope Withdrawal Time: 0 hours 6 minutes 12 seconds  Total Procedure Duration: 0 hours 13 minutes 7 seconds       [facility]

## 2016-12-24 NOTE — Anesthesia Procedure Notes (Signed)
Date/Time: 12/24/2016 9:05 AM Performed by: Johnna Acosta Pre-anesthesia Checklist: Patient identified, Emergency Drugs available, Suction available, Patient being monitored and Timeout performed Patient Re-evaluated:Patient Re-evaluated prior to inductionOxygen Delivery Method: Nasal cannula

## 2016-12-24 NOTE — Anesthesia Preprocedure Evaluation (Signed)
Anesthesia Evaluation  Patient identified by MRN, date of birth, ID band Patient awake    Reviewed: Allergy & Precautions, NPO status , Patient's Chart, lab work & pertinent test results  History of Anesthesia Complications Negative for: history of anesthetic complications  Airway Mallampati: I  TM Distance: >3 FB Neck ROM: Full    Dental no notable dental hx.    Pulmonary neg pulmonary ROS, neg sleep apnea, neg COPD,    breath sounds clear to auscultation- rhonchi (-) wheezing      Cardiovascular Exercise Tolerance: Good hypertension, Pt. on medications (-) CAD and (-) Past MI  Rhythm:Regular Rate:Normal - Systolic murmurs and - Diastolic murmurs    Neuro/Psych PSYCHIATRIC DISORDERS Anxiety Depression negative neurological ROS     GI/Hepatic Neg liver ROS, GERD  ,  Endo/Other  diabetes (borderline, not on medications)  Renal/GU negative Renal ROS     Musculoskeletal  (+) Arthritis , Osteoarthritis,    Abdominal (+) + obese,   Peds  Hematology negative hematology ROS (+)   Anesthesia Other Findings Past Medical History: No date: Allergic rhinitis     Comment: gets allergy shot from Dr. Carlis Abbott No date: Anxiety No date: Arthritis No date: Diabetes mellitus without complication (HCC)     Comment: diet controlled No date: GERD (gastroesophageal reflux disease) No date: History of shingles     Comment: at age 57- mild No date: Hx of colonic polyp No date: Hyperglycemia No date: Hyperlipemia     Comment: Very high chol intol of statins-does not want               to check it No date: Hypertension No date: Panic disorder No date: Tremor     Comment: from anx No date: Urinary incontinence     Comment: mixed No date: Voice disorder   Reproductive/Obstetrics                             Anesthesia Physical  Anesthesia Plan  ASA: II  Anesthesia Plan: General   Post-op Pain  Management:    Induction: Intravenous  Airway Management Planned: Natural Airway  Additional Equipment:   Intra-op Plan:   Post-operative Plan:   Informed Consent: I have reviewed the patients History and Physical, chart, labs and discussed the procedure including the risks, benefits and alternatives for the proposed anesthesia with the patient or authorized representative who has indicated his/her understanding and acceptance.   Dental advisory given  Plan Discussed with: CRNA and Anesthesiologist  Anesthesia Plan Comments:         Anesthesia Quick Evaluation

## 2016-12-24 NOTE — Anesthesia Post-op Follow-up Note (Cosign Needed)
Anesthesia QCDR form completed.        

## 2016-12-25 ENCOUNTER — Encounter: Payer: Self-pay | Admitting: Unknown Physician Specialty

## 2016-12-25 ENCOUNTER — Ambulatory Visit: Payer: 59 | Admitting: Psychiatry

## 2016-12-25 LAB — SURGICAL PATHOLOGY

## 2016-12-31 ENCOUNTER — Ambulatory Visit (INDEPENDENT_AMBULATORY_CARE_PROVIDER_SITE_OTHER)
Admission: RE | Admit: 2016-12-31 | Discharge: 2016-12-31 | Disposition: A | Discharge status: Home / Self Care | Payer: Medicare Other | Source: Ambulatory Visit | Attending: Family Medicine | Admitting: Family Medicine

## 2016-12-31 ENCOUNTER — Ambulatory Visit
Admission: RE | Admit: 2016-12-31 | Discharge: 2016-12-31 | Disposition: A | Discharge status: Home / Self Care | Payer: Medicare Other | Source: Ambulatory Visit | Attending: Family Medicine | Admitting: Family Medicine

## 2016-12-31 ENCOUNTER — Ambulatory Visit (INDEPENDENT_AMBULATORY_CARE_PROVIDER_SITE_OTHER): Payer: Medicare Other | Admitting: Family Medicine

## 2016-12-31 ENCOUNTER — Encounter: Payer: Self-pay | Admitting: Family Medicine

## 2016-12-31 VITALS — BP 106/60 | HR 79 | Temp 98.5°F | Ht 64.0 in | Wt 190.0 lb

## 2016-12-31 DIAGNOSIS — M7660 Achilles tendinitis, unspecified leg: Secondary | ICD-10-CM | POA: Insufficient documentation

## 2016-12-31 DIAGNOSIS — I10 Essential (primary) hypertension: Secondary | ICD-10-CM

## 2016-12-31 DIAGNOSIS — E6609 Other obesity due to excess calories: Secondary | ICD-10-CM | POA: Diagnosis not present

## 2016-12-31 DIAGNOSIS — Z6832 Body mass index (BMI) 32.0-32.9, adult: Secondary | ICD-10-CM

## 2016-12-31 NOTE — Progress Notes (Signed)
Subjective:    Patient ID: [name], female    DOB: 03/09/1946, 71 y.o.   MRN: [id number]  HPI Here for c/o of pain in ankles with some fluid retention   2-3 wk ago developed pain in ankles to walk -- she can barely get around the house  Feels it in the achilles tendon area both sides   Some swelling  Using ice quite often - feels good/helps a bit  No trauma  No new activity -some bike riding  No new stretching or yoga   No color change in feet   Same all day-no particular worse time   She had achilles tenonitis years ago- e stim helped (L) with PT -on the left   No flat or high arches  Wears flat shoes   Gait has changed since her knee surgery    Wt Readings from Last 3 Encounters:  12/31/16 190 lb (86.2 kg)  12/24/16 184 lb (83.5 kg)  06/13/16 183 lb 12 oz (83.3 kg)   bmi 32.6   BP Readings from Last 3 Encounters:  12/31/16 106/60  12/24/16 112/66  06/13/16 128/78  she takes amlodipine   nsaid-diclofenac gel occas (insurance will not pay and it does not help much) No oral nsaids now    (she initially took 2 aleve and it did not help)     Chemistry      Component Value Date/Time   NA 140 04/10/2016 0850   K 4.0 04/10/2016 0850   K 4.0 05/07/2014 1139   CL 104 04/10/2016 0850   CO2 28 04/10/2016 0850   BUN 23 (H) 04/10/2016 0850   CREATININE 0.87 04/10/2016 0850      Component Value Date/Time   CALCIUM 9.4 04/10/2016 0850   ALKPHOS 74 01/19/2016 1018   AST 17 01/19/2016 1018   ALT 18 01/19/2016 1018   BILITOT 0.6 01/19/2016 1018     Lab Results  Component Value Date   TSH 6.88 (H) 05/28/2016   Patient Active Problem List   Diagnosis Date Noted  . Achilles tendon pain 12/31/2016  . CAP (community acquired pneumonia) 06/13/2016  . Coughing 05/28/2016  . Osteopenia 02/13/2016  . Routine general medical examination at a health care facility 01/23/2016  . Estrogen deficiency 01/23/2016  . Abnormal TSH 01/23/2016  . Anxiety, generalized  02/21/2015  . H/O: HTN (hypertension) 02/21/2015  . Mild depression ([place]) 02/21/2015  . H/O diabetes mellitus 01/04/2015  . H/O arthritis 01/04/2015  . History of hay fever 01/04/2015  . Encounter for Medicare annual wellness exam 01/14/2013  . Obesity 01/15/2012  . Stress reaction, emotional 12/26/2010  . MENOPAUSAL SYNDROME 10/04/2008  . Hyperglycemia 12/02/2007  . TREMOR 12/02/2007  . DEPRESSION 08/04/2007  . Hyperlipidemia 01/28/2007  . ANXIETY 01/28/2007  . Essential hypertension 01/28/2007  . GERD 01/28/2007  . URINARY INCONTINENCE 01/28/2007  . COLONIC POLYPS, HX OF 01/28/2007  . HERPES ZOSTER 01/20/2007  . DYSPHONIA 01/20/2007  . COUGH, CHRONIC 01/20/2007  . ALLERGY 01/20/2007   Past Medical History:  Diagnosis Date  . Allergic rhinitis    gets allergy shot from Dr. Carlis Abbott  . Anxiety   . Arthritis   . Diabetes mellitus without complication (HCC)    diet controlled  . GERD (gastroesophageal reflux disease)   . History of shingles    at age 54- mild  . Hx of colonic polyp   . Hyperglycemia   . Hyperlipemia    Very high chol intol of statins-does not want  to check it  . Hypertension   . Panic disorder   . Tremor    from anx  . Urinary incontinence    mixed  . Voice disorder    Past Surgical History:  Procedure Laterality Date  . ABDOMINAL HYSTERECTOMY    . BLADDER SUSPENSION    . COLONOSCOPY WITH PROPOFOL N/A 12/24/2016   Procedure: COLONOSCOPY WITH PROPOFOL;  Surgeon: Manya Silvas, MD;  Location: [facility] ENDOSCOPY;  Service: Endoscopy;  Laterality: N/A;  . ESOPHAGOGASTRODUODENOSCOPY (EGD) WITH PROPOFOL N/A 12/24/2016   Procedure: ESOPHAGOGASTRODUODENOSCOPY (EGD) WITH PROPOFOL;  Surgeon: Manya Silvas, MD;  Location: [facility] ENDOSCOPY;  Service: Endoscopy;  Laterality: N/A;  . EYE SURGERY Bilateral 2000   eye lens replacement  . HAND SURGERY Left 2016   left long trigger finger release  . JOINT REPLACEMENT     knee replacement - march 15  . KNEE  ARTHROSCOPY Left 04/26/2016   Procedure: ARTHROSCOPY KNEE;  Surgeon: Hessie Knows, MD;  Location: [facility] ORS;  Service: Orthopedics;  Laterality: Left;  Marland Kitchen MEDIAL PARTIAL KNEE REPLACEMENT Left 2016  . NASAL SINUS SURGERY    . PARTIAL HYSTERECTOMY     with fibroid  . RECTOCELE REPAIR    . SYNOVECTOMY Left 04/26/2016   Procedure: PARTIAL SYNOVECTOMY;  Surgeon: Hessie Knows, MD;  Location: [facility] ORS;  Service: Orthopedics;  Laterality: Left;  . TONSILLECTOMY     Social History  Substance Use Topics  . Smoking status: Never Smoker  . Smokeless tobacco: Never Used  . Alcohol use No   Family History  Problem Relation Age of Onset  . Diabetes Mother   . Hyperlipidemia Mother   . Hypertension Mother   . Osteoarthritis Mother   . Cancer Father     Colon  . Leukemia Sister   . Syncope episode Daughter    Allergies  Allergen Reactions  . Atorvastatin     REACTION: shoulder pain  . Ezetimibe     REACTION: ?  . Lisinopril     REACTION: muscle pain  . Molds & Smuts   . Penicillins     REACTION: reaction not known Has patient had a PCN reaction causing immediate rash, facial/tongue/throat swelling, SOB or lightheadedness with hypotension: unknown Has patient had a PCN reaction causing severe rash involving mucus membranes or skin necrosis: unknown Has patient had a PCN reaction that required hospitalization unsure Has patient had a PCN reaction occurring within the last 10 years: no If all of the above answers are "NO", then may proceed with Cephalosporin use.  . Pneumococcal Vaccine Swelling    REACTION: severe local reaction  . Pneumococcal Vaccine Polyvalent     REACTION: severe local reaction  . Pollen Extract   . Rosuvastatin     REACTION: leg pain  . Simvastatin     REACTION: muscle pain   Current Outpatient Prescriptions on File Prior to Visit  Medication Sig Dispense Refill  . ALPRAZolam (XANAX) 0.5 MG tablet Take 1 tablet (0.5 mg total) by mouth at bedtime as needed for  anxiety. 90 tablet 1  . amLODipine (NORVASC) 5 MG tablet Take 1 tablet (5 mg total) by mouth daily. 90 tablet 3  . Azelastine HCl (ASTEPRO) 0.15 % SOLN 2 sprays by Nasal route 2 (two) times daily as needed. 30 mL 11  . Calcium Carbonate-Vitamin D (CALCIUM 600+D PO) Take 1 tablet by mouth 2 (two) times daily.     . cetirizine (ZYRTEC) 10 MG tablet Take 10 mg by mouth daily.      Marland Kitchen  diclofenac sodium (VOLTAREN) 1 % GEL Apply 2 g topically 2 (two) times daily.    Marland Kitchen EPINEPHrine 0.3 mg/0.3 mL IJ SOAJ injection Inject 0.3 mg into the skin once. as needed.    Marland Kitchen ibuprofen (ADVIL,MOTRIN) 200 MG tablet Take 200 mg by mouth every 6 (six) hours as needed for headache or moderate pain.    . Multiple Vitamin (MULTIVITAMIN) tablet Take 1 tablet by mouth daily.      . Omega-3 Fatty Acids (FISH OIL) 1200 MG CAPS Take 1 capsule by mouth daily.     Marland Kitchen omeprazole (PRILOSEC) 20 MG capsule Take 2 pills by mouth each am and one each pm 270 capsule 3  . ramipril (ALTACE) 10 MG capsule TAKE 1 CAPSULE BY MOUTH EVERY DAY 90 capsule 3  . sertraline (ZOLOFT) 50 MG tablet Take 1.5 tablets (75 mg total) by mouth daily. 135 tablet 1  . triamterene-hydrochlorothiazide (MAXZIDE-25) 37.5-25 MG tablet Take 1 tablet by mouth daily. (Patient taking differently: Take 0.5 tablets by mouth daily. ) 90 tablet 3   No current facility-administered medications on file prior to visit.     Review of Systems Review of Systems  Constitutional: Negative for fever, appetite change, fatigue and unexpected weight change.  Eyes: Negative for pain and visual disturbance.  Respiratory: Negative for cough and shortness of breath.   Cardiovascular: Negative for cp or palpitations    Gastrointestinal: Negative for nausea, diarrhea and constipation.  Genitourinary: Negative for urgency and frequency.  Skin: Negative for pallor or rash   MSK pos for achilles pain bilat worse with walking  Neurological: Negative for weakness, light-headedness, numbness  and headaches.  Hematological: Negative for adenopathy. Does not bruise/bleed easily.  Psychiatric/Behavioral: Negative for dysphoric mood. The patient is not nervous/anxious.         Objective:   Physical Exam  Constitutional: She appears well-developed and well-nourished. No distress.  obese and well appearing   Eyes: Conjunctivae and EOM are normal. Pupils are equal, round, and reactive to light. No scleral icterus.  Neck: Neck supple.  Pulmonary/Chest: Effort normal and breath sounds normal.  Musculoskeletal: She exhibits tenderness. She exhibits no edema.       Right ankle: She exhibits normal range of motion, no swelling, no ecchymosis, no deformity, no laceration and normal pulse. No tenderness. Achilles tendon exhibits pain. Achilles tendon exhibits no defect and normal Thompson's test results.  Tender in mid achilles area bilat  No acute swelling Nl rom-most pain on dorsiflex of foot  Nl perf and sensation  Gait- suggests discomfort     Neurological: She displays no atrophy. No cranial nerve deficit. She exhibits normal muscle tone.  Skin: Skin is warm and dry. No rash noted. No erythema. No pallor.  Psychiatric: She has a normal mood and affect.          Assessment & Plan:   Problem List Items Addressed This Visit      Cardiovascular and Mediastinum   Essential hypertension - Primary     Other   Achilles tendon pain    Strongly suspect recurrent achilles tendonitis (past had on L)  In pt who tends to grow scar tissue easily Elevate/ice/heat Handout given  Supportive shoes  vytorin gel prn  Xray now - ? If ca deposits  Consider PT or orthopedics       Relevant Orders   DG Ankle 2 Views Left   DG Ankle 2 Views Right   Obesity    .Discussed how this problem influences  overall health and the risks it imposes  Reviewed plan for weight loss with lower calorie diet (via better food choices and also portion control or program like weight watchers) and exercise  building up to or more than 30 minutes 5 days per week including some aerobic activity

## 2016-12-31 NOTE — Progress Notes (Signed)
Pre visit review using our clinic review tool, if applicable. No additional management support is needed unless otherwise documented below in the visit note. 

## 2016-12-31 NOTE — Assessment & Plan Note (Addendum)
Strongly suspect recurrent achilles tendonitis (past had on L)  In pt who tends to grow scar tissue easily Elevate/ice/heat Handout given  Supportive shoes  vytorin gel prn  Xray now - ? If ca deposits  Consider PT or orthopedics

## 2016-12-31 NOTE — Assessment & Plan Note (Signed)
Discussed how this problem influences overall health and the risks it imposes  Reviewed plan for weight loss with lower calorie diet (via better food choices and also portion control or program like weight watchers) and exercise building up to or more than 30 minutes 5 days per week including some aerobic activity    

## 2016-12-31 NOTE — Patient Instructions (Addendum)
Use the diclofenac gel that you have left on your achilles area several times per day  Use heat as needed (or ice depending on what feels better) Wear supportive shoes when walking  xrays today  Elevate feet when you can  I wonder if this is achilles tendonitis

## 2017-01-01 ENCOUNTER — Telehealth: Payer: Self-pay | Admitting: Family Medicine

## 2017-01-01 DIAGNOSIS — M7660 Achilles tendinitis, unspecified leg: Secondary | ICD-10-CM | POA: Insufficient documentation

## 2017-01-01 DIAGNOSIS — M7662 Achilles tendinitis, left leg: Principal | ICD-10-CM

## 2017-01-01 DIAGNOSIS — M7661 Achilles tendinitis, right leg: Secondary | ICD-10-CM

## 2017-01-01 NOTE — Telephone Encounter (Signed)
Done Will route to [facility] 

## 2017-01-01 NOTE — Telephone Encounter (Signed)
-----   Message from [name], [place] sent at 01/01/2017  5:05 PM EDT ----- Pt notified of xray results and she did view them on Mychart. Pt does agree with referral to Ortho. Pt is requesting to go to Dr. Satira Sark in [place] (she has had a few friends recommend him), pt said if she can't get in with him she would like to go to an Ortho Doc that Dr. Glori Bickers recommends, but pt would like to see Dr. Laveda Norman if possible, I advise pt our [facility] will call to schedule referral, please put referral in

## 2017-01-14 ENCOUNTER — Ambulatory Visit (INDEPENDENT_AMBULATORY_CARE_PROVIDER_SITE_OTHER): Payer: 59 | Admitting: Psychiatry

## 2017-01-14 ENCOUNTER — Encounter: Payer: Self-pay | Admitting: Psychiatry

## 2017-01-14 VITALS — BP 147/84 | HR 89 | Temp 98.0°F | Wt 191.0 lb

## 2017-01-14 DIAGNOSIS — F411 Generalized anxiety disorder: Secondary | ICD-10-CM | POA: Diagnosis not present

## 2017-01-14 DIAGNOSIS — F331 Major depressive disorder, recurrent, moderate: Secondary | ICD-10-CM | POA: Diagnosis not present

## 2017-01-14 MED ORDER — ALPRAZOLAM 0.5 MG PO TABS: 0.5000 mg | ORAL_TABLET | Freq: Every evening | ORAL | 1 refills | Status: DC | PRN

## 2017-01-14 MED ORDER — SERTRALINE HCL 50 MG PO TABS: 75.0000 mg | ORAL_TABLET | Freq: Every day | ORAL | 1 refills | Status: DC

## 2017-01-14 NOTE — Progress Notes (Signed)
BH MD/PA/NP OP Progress Note  01/14/2017 9:22 AM [name]  MRN:  [id number]  Subjective:  Patient is a 71 year-old female who presented for the follow-up appointment. She reported that she has been doing well since she was started on Xanax. Patient was discussing in detail about the tendinitis and was recently started on the steroid dose pack. Her blood pressure slightly elevated due to the same. She reported that she is having pain in her both feet and she is having difficulty walking. She has to go to [place] to attend the graduation ceremony this weekend. She reported that she will be able to walk since she be completing Dosepak by the time. Patient reported that she has been compliant with her medication and is able to sleep well with the help of the Xanax. She reported that this combination of medications helping her. She denied having any suicidal homicidal ideations or plans. She appeared calm and alert during the interview.  She reported that she has been following with her family care physician on a regular basis.   Chief Complaint:  Chief Complaint    Follow-up; Medication Refill     Visit Diagnosis:     ICD-9-CM ICD-10-CM   1. MDD (major depressive disorder), recurrent episode, moderate (HCC) 296.32 F33.1   2. Generalized anxiety disorder 300.02 F41.1     Past Medical History:  Past Medical History:  Diagnosis Date  . Allergic rhinitis    gets allergy shot from Dr. Carlis Abbott  . Anxiety   . Arthritis   . Diabetes mellitus without complication (HCC)    diet controlled  . GERD (gastroesophageal reflux disease)   . History of shingles    at age 27- mild  . Hx of colonic polyp   . Hyperglycemia   . Hyperlipemia    Very high chol intol of statins-does not want to check it  . Hypertension   . Panic disorder   . Tremor    from anx  . Urinary incontinence    mixed  . Voice disorder     Past Surgical History:  Procedure Laterality Date  . ABDOMINAL HYSTERECTOMY    .  BLADDER SUSPENSION    . COLONOSCOPY WITH PROPOFOL N/A 12/24/2016   Procedure: COLONOSCOPY WITH PROPOFOL;  Surgeon: Manya Silvas, MD;  Location: [facility] ENDOSCOPY;  Service: Endoscopy;  Laterality: N/A;  . ESOPHAGOGASTRODUODENOSCOPY (EGD) WITH PROPOFOL N/A 12/24/2016   Procedure: ESOPHAGOGASTRODUODENOSCOPY (EGD) WITH PROPOFOL;  Surgeon: Manya Silvas, MD;  Location: [facility] ENDOSCOPY;  Service: Endoscopy;  Laterality: N/A;  . EYE SURGERY Bilateral 2000   eye lens replacement  . HAND SURGERY Left 2016   left long trigger finger release  . JOINT REPLACEMENT     knee replacement - march 15  . KNEE ARTHROSCOPY Left 04/26/2016   Procedure: ARTHROSCOPY KNEE;  Surgeon: Hessie Knows, MD;  Location: [facility] ORS;  Service: Orthopedics;  Laterality: Left;  Marland Kitchen MEDIAL PARTIAL KNEE REPLACEMENT Left 2016  . NASAL SINUS SURGERY    . PARTIAL HYSTERECTOMY     with fibroid  . RECTOCELE REPAIR    . SYNOVECTOMY Left 04/26/2016   Procedure: PARTIAL SYNOVECTOMY;  Surgeon: Hessie Knows, MD;  Location: [facility] ORS;  Service: Orthopedics;  Laterality: Left;  . TONSILLECTOMY     Family History:  Family History  Problem Relation Age of Onset  . Diabetes Mother   . Hyperlipidemia Mother   . Hypertension Mother   . Osteoarthritis Mother   . Cancer Father  Colon  . Leukemia Sister   . Syncope episode Daughter    Social History:  Social History   Social History  . Marital status: Married    Spouse name: N/A  . Number of children: 2  . 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 Smoking status: Never Smoker  . Smokeless tobacco: Never Used  . Alcohol use No  . Drug use: No  . Sexual activity: No   Other Topics Concern  . None   Social History Narrative  . None   Additional History:  Lives with husband. She stated that she spends time by exercise and doing hosehold chores. She stated that she is having issues due to  knee replacement now.   Assessment:   Musculoskeletal: Strength & Muscle Tone: within normal limits Gait & Station: normal Patient leans: N/A  Psychiatric Specialty Exam: Medication Refill  Associated symptoms include myalgias. Pertinent negatives include no vomiting.    Review of Systems  Constitutional: Negative for weight loss.  HENT: Negative for hearing loss.   Eyes: Negative for photophobia.  Respiratory: Negative for hemoptysis.   Cardiovascular: Negative for palpitations.  Gastrointestinal: Negative for vomiting.  Genitourinary: Negative for urgency.  Musculoskeletal: Positive for back pain, joint pain and myalgias.  Neurological: Negative for tremors.  Endo/Heme/Allergies: Negative for environmental allergies.  Psychiatric/Behavioral: Negative for depression and hallucinations. The patient has insomnia. The patient is not nervous/anxious.   All other systems reviewed and are negative.   Blood pressure (!) 147/84, pulse 89, temperature 98 F (36.7 C), temperature source Oral, weight 191 lb (86.6 kg), last menstrual period 09/18/1987.Body mass index is 32.79 kg/m.  General Appearance: Casual  Eye Contact:  Fair  Speech:  Clear and Coherent  Volume:  Normal  Mood:  Anxious  Affect:  Appropriate  Thought Process:  Logical  Orientation:  Full (Time, Place, and Person)  Thought Content:  WDL  Suicidal Thoughts:  No  Homicidal Thoughts:  No  Memory:  Immediate;   Good  Judgement:  Fair  Insight:  Fair  Psychomotor Activity:  Normal  Concentration:  Fair  Recall:  [employer] of Knowledge: Fair  Language: Fair  Akathisia:  No  Handed:  Right  AIMS (if indicated):  none  Assets:  Communication Skills  ADL's:  Intact  Cognition: WNL  Sleep:  5-6    Is the patient at risk to self?  No. Has the patient been a risk to self in the past 6 months?  No. Has the patient been a risk to self within the distant past?  No. Is the patient a risk to others?  No. Has the  patient been a risk to others in the past 6 months?  No.   Has the patient been a risk to others within the distant past?  No.  Current Medications: Current Outpatient Prescriptions  Medication Sig Dispense Refill  . ALPRAZolam (XANAX) 0.5 MG tablet Take 1 tablet (0.5 mg total) by mouth at bedtime as needed for anxiety. 90 tablet 1  . amLODipine (NORVASC) 5 MG tablet Take 1 tablet (5 mg total) by mouth daily. 90 tablet 3  . Azelastine HCl (ASTEPRO) 0.15 % SOLN 2 sprays by Nasal route 2 (two) times daily as needed. 30 mL 11  . Calcium Carbonate-Vitamin D (CALCIUM 600+D PO) Take 1 tablet by mouth 2 (two) times daily.     Marland Kitchen  cetirizine (ZYRTEC) 10 MG tablet Take 10 mg by mouth daily.      . diclofenac sodium (VOLTAREN) 1 % GEL Apply 2 g topically 2 (two) times daily.    Marland Kitchen EPINEPHrine 0.3 mg/0.3 mL IJ SOAJ injection Inject 0.3 mg into the skin once. as needed.    Marland Kitchen ibuprofen (ADVIL,MOTRIN) 200 MG tablet Take 200 mg by mouth every 6 (six) hours as needed for headache or moderate pain.    . Multiple Vitamin (MULTIVITAMIN) tablet Take 1 tablet by mouth daily.      . Omega-3 Fatty Acids (FISH OIL) 1200 MG CAPS Take 1 capsule by mouth daily.     Marland Kitchen omeprazole (PRILOSEC) 20 MG capsule Take 2 pills by mouth each am and one each pm 270 capsule 3  . predniSONE (STERAPRED UNI-PAK 21 TAB) 5 MG (21) TBPK tablet Take 5 mg by mouth daily.    . ramipril (ALTACE) 10 MG capsule TAKE 1 CAPSULE BY MOUTH EVERY DAY 90 capsule 3  . sertraline (ZOLOFT) 50 MG tablet Take 1.5 tablets (75 mg total) by mouth daily. 135 tablet 1  . triamterene-hydrochlorothiazide (MAXZIDE-25) 37.5-25 MG tablet Take 1 tablet by mouth daily. (Patient taking differently: Take 0.5 tablets by mouth daily. ) 90 tablet 3   No current facility-administered medications for this visit.     Medical Decision Making:  Established Problem, Stable/Improving (1)  Treatment Plan Summary:Medication management   Discussed with patient about her  medications and   She will continue her on the medications as follows Zoloft 75 mg by mouth daily  Prescribed Xanax  0.5  milligram by mouth  qhs .   Patient will be given six-month supply of the medications.    Follow-up in 6  months    More than 50% of the time spent in psychoeducation, counseling and coordination of care.    This note was generated in part or whole with voice recognition software. Voice regonition is usually quite accurate but there are transcription errors that can and very often do occur. I apologize for any typographical errors that were not detected and corrected.   Rainey Pines, MD  01/14/2017, 9:22 AM

## 2017-03-04 ENCOUNTER — Other Ambulatory Visit: Payer: Self-pay | Admitting: Psychiatry

## 2017-03-04 ENCOUNTER — Telehealth: Payer: Self-pay | Admitting: Family Medicine

## 2017-03-04 ENCOUNTER — Other Ambulatory Visit: Payer: Self-pay | Admitting: Family Medicine

## 2017-03-04 DIAGNOSIS — Z8639 Personal history of other endocrine, nutritional and metabolic disease: Secondary | ICD-10-CM

## 2017-03-04 DIAGNOSIS — E7800 Pure hypercholesterolemia, unspecified: Secondary | ICD-10-CM

## 2017-03-04 DIAGNOSIS — R739 Hyperglycemia, unspecified: Secondary | ICD-10-CM

## 2017-03-04 DIAGNOSIS — R7989 Other specified abnormal findings of blood chemistry: Secondary | ICD-10-CM

## 2017-03-04 DIAGNOSIS — Z0000 Encounter for general adult medical examination without abnormal findings: Secondary | ICD-10-CM

## 2017-03-04 NOTE — Telephone Encounter (Signed)
-----   Message from Eustace Pen, LPN sent at [phone]  3:48 PM EDT ----- Regarding: Labs 6/21 Please place lab orders.   [facility] Medicare

## 2017-03-07 ENCOUNTER — Ambulatory Visit (INDEPENDENT_AMBULATORY_CARE_PROVIDER_SITE_OTHER): Payer: Medicare Other

## 2017-03-07 ENCOUNTER — Other Ambulatory Visit: Payer: Medicare Other

## 2017-03-07 VITALS — BP 110/72 | HR 73 | Temp 98.2°F | Ht 64.25 in | Wt 190.5 lb

## 2017-03-07 DIAGNOSIS — R739 Hyperglycemia, unspecified: Secondary | ICD-10-CM

## 2017-03-07 DIAGNOSIS — Z0000 Encounter for general adult medical examination without abnormal findings: Secondary | ICD-10-CM | POA: Diagnosis not present

## 2017-03-07 DIAGNOSIS — R946 Abnormal results of thyroid function studies: Secondary | ICD-10-CM

## 2017-03-07 DIAGNOSIS — R7989 Other specified abnormal findings of blood chemistry: Secondary | ICD-10-CM

## 2017-03-07 LAB — LIPID PANEL
Cholesterol: 326 mg/dL — ABNORMAL HIGH (ref 0–200)
HDL: 47.4 mg/dL (ref >39.00)
LDL Cholesterol: 257 mg/dL — ABNORMAL HIGH (ref 0–99)
NONHDL: 278.36
Total CHOL/HDL Ratio: 7
Triglycerides: 108 mg/dL (ref 0.0–149.0)
VLDL: 21.6 mg/dL (ref 0.0–40.0)

## 2017-03-07 LAB — CBC WITH DIFFERENTIAL/PLATELET
BASOS PCT: 0.9 % (ref 0.0–3.0)
Basophils Absolute: 0.1 10*3/uL (ref 0.0–0.1)
EOS ABS: 0.3 10*3/uL (ref 0.0–0.7)
Eosinophils Relative: 4 % (ref 0.0–5.0)
HEMATOCRIT: 41.7 % (ref 36.0–46.0)
Hemoglobin: 14.2 g/dL (ref 12.0–15.0)
LYMPHS PCT: 32 % (ref 12.0–46.0)
Lymphs Abs: 2.6 10*3/uL (ref 0.7–4.0)
MCHC: 34 g/dL (ref 30.0–36.0)
MCV: 84.5 fl (ref 78.0–100.0)
Monocytes Absolute: 0.8 10*3/uL (ref 0.1–1.0)
Monocytes Relative: 9.6 % (ref 3.0–12.0)
Neutro Abs: 4.4 10*3/uL (ref 1.4–7.7)
Neutrophils Relative %: 53.5 % (ref 43.0–77.0)
PLATELETS: 273 10*3/uL (ref 150.0–400.0)
RBC: 4.94 Mil/uL (ref 3.87–5.11)
RDW: 13.8 % (ref 11.5–15.5)
WBC: 8.2 10*3/uL (ref 4.0–10.5)

## 2017-03-07 LAB — COMPREHENSIVE METABOLIC PANEL
ALT: 20 U/L (ref 0–35)
AST: 18 U/L (ref 0–37)
Albumin: 4.3 g/dL (ref 3.5–5.2)
Alkaline Phosphatase: 74 U/L (ref 39–117)
BUN: 18 mg/dL (ref 6–23)
CALCIUM: 9.8 mg/dL (ref 8.4–10.5)
CHLORIDE: 101 meq/L (ref 96–112)
CO2: 29 meq/L (ref 19–32)
CREATININE: 0.98 mg/dL (ref 0.40–1.20)
GFR: 59.49 mL/min — AB (ref >60.00)
Glucose, Bld: 120 mg/dL — ABNORMAL HIGH (ref 70–99)
Potassium: 3.9 mEq/L (ref 3.5–5.1)
Sodium: 138 mEq/L (ref 135–145)
Total Bilirubin: 0.5 mg/dL (ref 0.2–1.2)
Total Protein: 6.7 g/dL (ref 6.0–8.3)

## 2017-03-07 LAB — TSH: TSH: 5 u[IU]/mL — AB (ref 0.35–4.50)

## 2017-03-07 LAB — T4, FREE: Free T4: 0.76 ng/dL (ref 0.60–1.60)

## 2017-03-07 LAB — HEMOGLOBIN A1C: Hgb A1c MFr Bld: 6.7 % — ABNORMAL HIGH (ref 4.6–6.5)

## 2017-03-07 NOTE — Progress Notes (Signed)
Subjective:   [name] is a 71 y.o. female who presents for Medicare Annual (Subsequent) preventive examination.  Review of Systems:  N/A Cardiac Risk Factors include: advanced age (>109men, >97 women);dyslipidemia;hypertension;obesity (BMI >30kg/m2)     Objective:     Vitals: BP 110/72 (BP Location: Right Arm, Patient Position: Sitting, Cuff Size: Normal)   Pulse 73   Temp 98.2 F (36.8 C) (Oral)   Ht 5' 4.25" (1.632 m) Comment: no shoes  Wt 190 lb 8 oz (86.4 kg)   LMP 09/18/1987   SpO2 96%   BMI 32.45 kg/m   Body mass index is 32.45 kg/m.   Tobacco History  Smoking Status  . Never Smoker  Smokeless Tobacco  . Never Used     Counseling given: No   Past Medical History:  Diagnosis Date  . Allergic rhinitis    gets allergy shot from Dr. Carlis Abbott  . Anxiety   . Arthritis   . Diabetes mellitus without complication (HCC)    diet controlled  . GERD (gastroesophageal reflux disease)   . History of shingles    at age 24- mild  . Hx of colonic polyp   . Hyperglycemia   . Hyperlipemia    Very high chol intol of statins-does not want to check it  . Hypertension   . Panic disorder   . Tremor    from anx  . Urinary incontinence    mixed  . Voice disorder    Past Surgical History:  Procedure Laterality Date  . ABDOMINAL HYSTERECTOMY    . BLADDER SUSPENSION    . COLONOSCOPY WITH PROPOFOL N/A 12/24/2016   Procedure: COLONOSCOPY WITH PROPOFOL;  Surgeon: Manya Silvas, MD;  Location: [facility] ENDOSCOPY;  Service: Endoscopy;  Laterality: N/A;  . ESOPHAGOGASTRODUODENOSCOPY (EGD) WITH PROPOFOL N/A 12/24/2016   Procedure: ESOPHAGOGASTRODUODENOSCOPY (EGD) WITH PROPOFOL;  Surgeon: Manya Silvas, MD;  Location: [facility] ENDOSCOPY;  Service: Endoscopy;  Laterality: N/A;  . EYE SURGERY Bilateral 2000   eye lens replacement  . HAND SURGERY Left 2016   left long trigger finger release  . JOINT REPLACEMENT     knee replacement - march 15  . KNEE ARTHROSCOPY Left 04/26/2016   Procedure: ARTHROSCOPY KNEE;  Surgeon: Hessie Knows, MD;  Location: [facility] ORS;  Service: Orthopedics;  Laterality: Left;  Marland Kitchen MEDIAL PARTIAL KNEE REPLACEMENT Left 2016  . NASAL SINUS SURGERY    . PARTIAL HYSTERECTOMY     with fibroid  . RECTOCELE REPAIR    . SYNOVECTOMY Left 04/26/2016   Procedure: PARTIAL SYNOVECTOMY;  Surgeon: Hessie Knows, MD;  Location: [facility] ORS;  Service: Orthopedics;  Laterality: Left;  . TONSILLECTOMY     Family History  Problem Relation Age of Onset  . Diabetes Mother   . Hyperlipidemia Mother   . Hypertension Mother   . Osteoarthritis Mother   . Cancer Father        Colon  . Leukemia Sister   . Syncope episode Daughter    History  Sexual Activity  . Sexual activity: No    Outpatient Encounter Prescriptions as of 03/07/2017  Medication Sig  . ALPRAZolam (XANAX) 0.5 MG tablet Take 1 tablet (0.5 mg total) by mouth at bedtime as needed for anxiety.  Marland Kitchen amLODipine (NORVASC) 5 MG tablet Take 1 tablet (5 mg total) by mouth daily.  . Azelastine HCl (ASTEPRO) 0.15 % SOLN 2 sprays by Nasal route 2 (two) times daily as needed.  . Calcium Carbonate-Vitamin D (CALCIUM 600+D PO) Take 1  tablet by mouth 2 (two) times daily.   . cetirizine (ZYRTEC) 10 MG tablet Take 10 mg by mouth daily.    Marland Kitchen EPINEPHrine 0.3 mg/0.3 mL IJ SOAJ injection Inject 0.3 mg into the skin once. as needed.  Marland Kitchen ibuprofen (ADVIL,MOTRIN) 200 MG tablet Take 200 mg by mouth every 6 (six) hours as needed for headache or moderate pain.  . Multiple Vitamin (MULTIVITAMIN) tablet Take 1 tablet by mouth daily.    . Omega-3 Fatty Acids (FISH OIL) 1200 MG CAPS Take 1 capsule by mouth daily.   Marland Kitchen omeprazole (PRILOSEC) 20 MG capsule Take 2 pills by mouth each am and one each pm  . ramipril (ALTACE) 10 MG capsule TAKE 1 CAPSULE BY MOUTH EVERY DAY  . sertraline (ZOLOFT) 50 MG tablet Take 1.5 tablets (75 mg total) by mouth daily.  Marland Kitchen triamterene-hydrochlorothiazide (MAXZIDE-25) 37.5-25 MG tablet TAKE ONE TABLET BY MOUTH  EVERY DAY  . [DISCONTINUED] diclofenac sodium (VOLTAREN) 1 % GEL Apply 2 g topically 2 (two) times daily.  . [DISCONTINUED] predniSONE (STERAPRED UNI-PAK 21 TAB) 5 MG (21) TBPK tablet Take 5 mg by mouth daily.  . [DISCONTINUED] sertraline (ZOLOFT) 50 MG tablet Take 1 tablet (50 mg total) by mouth daily.   No facility-administered encounter medications on file as of 03/07/2017.     Activities of Daily Living In your present state of health, do you have any difficulty performing the following activities: 03/07/2017 04/10/2016  Hearing? N N  Vision? N N  Difficulty concentrating or making decisions? N N  Walking or climbing stairs? N Y  Dressing or bathing? N N  Doing errands, shopping? N N  Preparing Food and eating ? N -  Using the Toilet? N -  In the past six months, have you accidently leaked urine? N -  Do you have problems with loss of bowel control? N -  Managing your Medications? N -  Managing your Finances? N -  Housekeeping or managing your Housekeeping? N -  Some recent data might be hidden    Patient Care Team: Tower, Wynelle Fanny, MD as PCP - General Carloyn Manner, MD as Referring Physician (Otolaryngology) Hessie Knows, MD as Consulting Physician (Orthopedic Surgery) Rainey Pines, MD as Referring Physician (Psychiatry) Manya Silvas, MD as Consulting Physician (Gastroenterology) Eula Flax, OD as Referring Physician (Optometry)    Assessment:     Hearing Screening   125Hz  250Hz  500Hz  1000Hz  2000Hz  3000Hz  4000Hz  6000Hz  8000Hz   Right ear:   40 40 40  40    Left ear:   0 40 40  0    Vision Screening Comments: Last vision exam in June 2017 with Dr. Eula Flax @ [place] and Dietary recommendations Current Exercise Habits: Structured exercise class;Home exercise routine, Type of exercise: stretching;Other - see comments (physical therapy exercises 2 days outpatient 3 days at home), Time (Minutes): 60, Frequency (Times/Week): 5,  Weekly Exercise (Minutes/Week): 300, Intensity: Moderate, Exercise limited by: orthopedic condition(s)  Goals    . Increase physical activity          Until released from PT, I will continue to do physical therapy exercises for 60 min/day  5 days per week.       Fall Risk Fall Risk  03/07/2017 03/07/2016 01/23/2016 01/21/2015 01/19/2014  Falls in the past year? No Yes Yes Yes No  Number falls in past yr: - 1 1 1  -  Injury with Fall? - No No No -  Follow up -  Falls evaluation completed - - -   Depression Screen PHQ 2/9 Scores 03/07/2017 03/07/2016 01/23/2016 01/21/2015  PHQ - 2 Score 0 0 0 0     Cognitive Function MMSE - Mini Mental State Exam 03/07/2017 03/07/2016  Orientation to time 5 5  Orientation to Place 5 5  Registration 3 3  Attention/ Calculation 0 0  Recall 3 3  Language- name 2 objects 0 0  Language- repeat 1 1  Language- follow 3 step command 3 3  Language- read & follow direction 0 0  Write a sentence 0 0  Copy design 0 0  Total score 20 20     PLEASE NOTE: A Mini-Cog screen was completed. Maximum score is 20. A value of 0 denotes this part of Folstein MMSE was not completed or the patient failed this part of the Mini-Cog screening.   Mini-Cog Screening Orientation to Time - Max 5 pts Orientation to Place - Max 5 pts Registration - Max 3 pts Recall - Max 3 pts Language Repeat - Max 1 pts Language Follow 3 Step Command - Max 3 pts     Immunization History  Administered Date(s) Administered  . H1N1 10/04/2008  . Hepatitis B 01/16/1992, 02/16/1992, 08/17/1992  . Influenza Split 07/18/2011, 06/04/2012  . Influenza Whole 06/17/2008, 07/19/2009, 07/11/2010  . Influenza,inj,Quad PF,36+ Mos 06/02/2013, 06/13/2016  . Influenza,trivalent, recombinat, inj, PF 06/09/2015  . Influenza-Unspecified 06/10/2014  . Pneumococcal Polysaccharide-23 09/17/2002  . Td 10/04/2008  . Zoster 05/25/2009   Screening Tests Health Maintenance  Topic Date Due  . PNA vac Low Risk Adult (1  of 2 - PCV13) 10/19/2020 (Originally 05/13/2011)  . INFLUENZA VACCINE  04/17/2017  . MAMMOGRAM  06/12/2017  . TETANUS/TDAP  10/04/2018  . COLONOSCOPY  12/25/2026  . DEXA SCAN  Completed  . Hepatitis C Screening  Completed      Plan:     I have personally reviewed and addressed the Medicare Annual Wellness questionnaire and have noted the following in the patient's chart:  A. Medical and social history B. Use of alcohol, tobacco or illicit drugs  C. Current medications and supplements D. Functional ability and status E.  Nutritional status F.  Physical activity G. Advance directives H. List of other physicians I.  Hospitalizations, surgeries, and ER visits in previous 12 months J.  [place] to include hearing, vision, cognitive, depression L. Referrals and appointments - none  In addition, I have reviewed and discussed with patient certain preventive protocols, quality metrics, and best practice recommendations. A written personalized care plan for preventive services as well as general preventive health recommendations were provided to patient.  See attached scanned questionnaire for additional information.   Signed,   Lindell Noe, MHA, BS, LPN Health Coach

## 2017-03-07 NOTE — Progress Notes (Signed)
PCP notes:   Health maintenance:  No gaps identified.  Abnormal screenings:   Hearing - failed  Patient concerns:   Pt is currently in outpatient PT 2 days per week for achilles tendonitis in bilateral heels. Pain scale: 4/10.  Nurse concerns:  None  Next PCP appt:   04/02/17 @  1130

## 2017-03-07 NOTE — Patient Instructions (Signed)
[name] , Thank you for taking time to come for your Medicare Wellness Visit. I appreciate your ongoing commitment to your health goals. Please review the following plan we discussed and let me know if I can assist you in the future.   These are the goals we discussed: Goals    . Increase physical activity          Until released from PT, I will continue to do physical therapy exercises for 60 min/day  5 days per week.        This is a list of the screening recommended for you and due dates:  Health Maintenance  Topic Date Due  . Pneumonia vaccines (1 of 2 - PCV13) 10/19/2020*  . Flu Shot  04/17/2017  . Mammogram  06/12/2017  . Tetanus Vaccine  10/04/2018  . Colon Cancer Screening  12/25/2026  . DEXA scan (bone density measurement)  Completed  .  Hepatitis C: One time screening is recommended by Center for Disease Control  (CDC) for  adults born from [birth year] through 1965.   Completed  *Topic was postponed. The date shown is not the original due date.   Preventive Care for Adults  A healthy lifestyle and preventive care can promote health and wellness. Preventive health guidelines for adults include the following key practices.  . A routine yearly physical is a good way to check with your health care provider about your health and preventive screening. It is a chance to share any concerns and updates on your health and to receive a thorough exam.  . Visit your dentist for a routine exam and preventive care every 6 months. Brush your teeth twice a day and floss once a day. Good oral hygiene prevents tooth decay and gum disease.  . The frequency of eye exams is based on your age, health, family medical history, use  of contact lenses, and other factors. Follow your health care provider's ecommendations for frequency of eye exams.  . Eat a healthy diet. Foods like vegetables, fruits, whole grains, low-fat dairy products, and lean protein foods contain the nutrients you need without too  many calories. Decrease your intake of foods high in solid fats, added sugars, and salt. Eat the right amount of calories for you. Get information about a proper diet from your health care provider, if necessary.  . Regular physical exercise is one of the most important things you can do for your health. Most adults should get at least 150 minutes of moderate-intensity exercise (any activity that increases your heart rate and causes you to sweat) each week. In addition, most adults need muscle-strengthening exercises on 2 or more days a week.  Silver Sneakers may be a benefit available to you. To determine eligibility, you may visit the website: www.silversneakers.com or contact program at (930) 515-7199 Mon-Fri between 8AM-8PM.   . Maintain a healthy weight. The body mass index (BMI) is a screening tool to identify possible weight problems. It provides an estimate of body fat based on height and weight. Your health care provider can find your BMI and can help you achieve or maintain a healthy weight.   For adults 20 years and older: ? A BMI below 18.5 is considered underweight. ? A BMI of 18.5 to 24.9 is normal. ? A BMI of 25 to 29.9 is considered overweight. ? A BMI of 30 and above is considered obese.   . Maintain normal blood lipids and cholesterol levels by exercising and minimizing your intake of saturated  fat. Eat a balanced diet with plenty of fruit and vegetables. Blood tests for lipids and cholesterol should begin at age 34 and be repeated every 5 years. If your lipid or cholesterol levels are high, you are over 50, or you are at high risk for heart disease, you may need your cholesterol levels checked more frequently. Ongoing high lipid and cholesterol levels should be treated with medicines if diet and exercise are not working.  . If you smoke, find out from your health care provider how to quit. If you do not use tobacco, please do not start.  . If you choose to drink alcohol, please  do not consume more than 2 drinks per day. One drink is considered to be 12 ounces (355 mL) of beer, 5 ounces (148 mL) of wine, or 1.5 ounces (44 mL) of liquor.  . If you are 36-4 years old, ask your health care provider if you should take aspirin to prevent strokes.  . Use sunscreen. Apply sunscreen liberally and repeatedly throughout the day. You should seek shade when your shadow is shorter than you. Protect yourself by wearing long sleeves, pants, a wide-brimmed hat, and sunglasses year round, whenever you are outdoors.  . Once a month, do a whole body skin exam, using a mirror to look at the skin on your back. Tell your health care provider of new moles, moles that have irregular borders, moles that are larger than a pencil eraser, or moles that have changed in shape or color.

## 2017-03-07 NOTE — Progress Notes (Signed)
Pre visit review using our clinic review tool, if applicable. No additional management support is needed unless otherwise documented below in the visit note. 

## 2017-03-07 NOTE — Progress Notes (Signed)
I reviewed health advisor's note, was available for consultation, and agree with documentation and plan.  

## 2017-03-12 ENCOUNTER — Encounter: Payer: Medicare Other | Admitting: Family Medicine

## 2017-03-21 ENCOUNTER — Other Ambulatory Visit: Payer: Self-pay | Admitting: Family Medicine

## 2017-04-02 ENCOUNTER — Encounter: Payer: Medicare Other | Admitting: Family Medicine

## 2017-04-02 ENCOUNTER — Encounter: Payer: Self-pay | Admitting: Family Medicine

## 2017-04-02 ENCOUNTER — Ambulatory Visit (INDEPENDENT_AMBULATORY_CARE_PROVIDER_SITE_OTHER): Payer: Medicare Other | Admitting: Family Medicine

## 2017-04-02 VITALS — BP 128/70 | HR 87 | Temp 98.2°F | Ht 64.25 in | Wt 188.5 lb

## 2017-04-02 DIAGNOSIS — R946 Abnormal results of thyroid function studies: Secondary | ICD-10-CM | POA: Diagnosis not present

## 2017-04-02 DIAGNOSIS — Z0001 Encounter for general adult medical examination with abnormal findings: Secondary | ICD-10-CM | POA: Diagnosis not present

## 2017-04-02 DIAGNOSIS — F320 Major depressive disorder, single episode, mild: Secondary | ICD-10-CM | POA: Diagnosis not present

## 2017-04-02 DIAGNOSIS — Z6832 Body mass index (BMI) 32.0-32.9, adult: Secondary | ICD-10-CM

## 2017-04-02 DIAGNOSIS — E7800 Pure hypercholesterolemia, unspecified: Secondary | ICD-10-CM

## 2017-04-02 DIAGNOSIS — E6609 Other obesity due to excess calories: Secondary | ICD-10-CM | POA: Diagnosis not present

## 2017-04-02 DIAGNOSIS — M85859 Other specified disorders of bone density and structure, unspecified thigh: Secondary | ICD-10-CM

## 2017-04-02 DIAGNOSIS — I10 Essential (primary) hypertension: Secondary | ICD-10-CM

## 2017-04-02 DIAGNOSIS — R739 Hyperglycemia, unspecified: Secondary | ICD-10-CM

## 2017-04-02 DIAGNOSIS — Z0000 Encounter for general adult medical examination without abnormal findings: Secondary | ICD-10-CM

## 2017-04-02 DIAGNOSIS — F32A Depression, unspecified: Secondary | ICD-10-CM

## 2017-04-02 DIAGNOSIS — R7989 Other specified abnormal findings of blood chemistry: Secondary | ICD-10-CM

## 2017-04-02 MED ORDER — RAMIPRIL 10 MG PO CAPS: ORAL_CAPSULE | ORAL | 3 refills | Status: DC

## 2017-04-02 MED ORDER — OMEPRAZOLE 20 MG PO CPDR: DELAYED_RELEASE_CAPSULE | ORAL | 3 refills | Status: DC

## 2017-04-02 MED ORDER — AMLODIPINE BESYLATE 5 MG PO TABS: 5.0000 mg | ORAL_TABLET | Freq: Every day | ORAL | 3 refills | Status: DC

## 2017-04-02 MED ORDER — TRIAMTERENE-HCTZ 37.5-25 MG PO TABS: 1.0000 | ORAL_TABLET | Freq: Every day | ORAL | 3 refills | Status: DC

## 2017-04-02 NOTE — Assessment & Plan Note (Signed)
Doing well  Continues psychiatric care

## 2017-04-02 NOTE — Assessment & Plan Note (Signed)
Mild osteopenia dexa 5/17 On ca and d with exercise No falls or fractures   Disc need for calcium/ vitamin D/ wt bearing exercise and bone density test every 2 y to monitor Disc safety/ fracture risk in detail

## 2017-04-02 NOTE — Patient Instructions (Signed)
Get back on track with diet and exercise (upper body exercise if you cannot do more than that)  Take care of yourself Limit carbs and sugar for A1C which is up   For cholesterol    Avoid red meat/ fried foods/ egg yolks/ fatty breakfast meats/ butter, cheese and high fat dairy/ and shellfish    Follow up in 3 months

## 2017-04-02 NOTE — Assessment & Plan Note (Signed)
Reviewed health habits including diet and exercise and skin cancer prevention Reviewed appropriate screening tests for age  Also reviewed health mt list, fam hx and immunization status , as well as social and family history   See HPI Labs reviewed  AMW reviewed

## 2017-04-02 NOTE — Assessment & Plan Note (Signed)
Lab Results  Component Value Date   TSH 5.00 (H) 03/07/2017   Free T4 is normal  Continue to watch Will likely become hypothyroid No symptoms

## 2017-04-02 NOTE — Assessment & Plan Note (Signed)
Discussed how this problem influences overall health and the risks it imposes  Reviewed plan for weight loss with lower calorie diet (via better food choices and also portion control or program like weight watchers) and exercise building up to or more than 30 minutes 5 days per week including some aerobic activity    

## 2017-04-02 NOTE — Assessment & Plan Note (Signed)
bp in fair control at this time  BP Readings from Last 1 Encounters:  04/02/17 128/70   No changes needed Disc lifstyle change with low sodium diet and exercise  Labs reviewed

## 2017-04-02 NOTE — Progress Notes (Signed)
Subjective:    Patient ID: [name], female    DOB: 04-29-1946, 71 y.o.   MRN: [id number]  HPI  Here for health maintenance exam and to review chronic medical problems    Has had a good summer  Feeling good but still dealing with foot tendonitis  Sees Dr Mack Guise  In PT now - improving (flared back up with bike)  May consider surgery  Wants to stay active  One leg is longer and she has had knee surgery also    Had amw 6/21 Failed hearing test- L ear high and low freq- it does not bother her  No gaps identified   Wt Readings from Last 3 Encounters:  04/02/17 188 lb 8 oz (85.5 kg)  03/07/17 190 lb 8 oz (86.4 kg)  12/31/16 190 lb (86.2 kg)  wt is down a few lb / ? Due to physical therapy  Eating healthy for the most part but she had vacation right before labs  32.10 kg/m  dexa 5/17-mild osteopenia at femoral neck She takes ca and D and exercises  No falls or fx   Mammogram 9/17 nl  Self breast exam - no breast lumps   Colonoscopy 4/18 polyp (hyperplastic)  Father had colon cancer  Recall 5 y   zostavax 9/10  Has not had prevnar- but declines it due to severe local rxn to the PNA 23  bp is stable today  No cp or palpitations or headaches or edema  No side effects to medicines  BP Readings from Last 3 Encounters:  04/02/17 128/70  03/07/17 110/72  12/31/16 106/60      Hx of depression - doing well overall  Mood is better now that her health is better  Sees psychiatrist  Hyperlipidemia Lab Results  Component Value Date   CHOL 326 (H) 03/07/2017   CHOL 343 (H) 01/19/2016   CHOL 338 (H) 01/18/2015   Lab Results  Component Value Date   HDL 47.40 03/07/2017   HDL 45.40 01/19/2016   HDL 42.10 01/18/2015   Lab Results  Component Value Date   LDLCALC 257 (H) 03/07/2017   LDLCALC 275 (H) 01/19/2016   LDLCALC 269 (H) 01/18/2015   Lab Results  Component Value Date   TRIG 108.0 03/07/2017   TRIG 114.0 01/19/2016   TRIG 136.0 01/18/2015   Lab  Results  Component Value Date   CHOLHDL 7 03/07/2017   CHOLHDL 8 01/19/2016   CHOLHDL 8 01/18/2015   Lab Results  Component Value Date   LDLDIRECT 242.8 01/15/2012   LDLDIRECT 237.0 12/18/2010   LDLDIRECT 227.8 09/20/2009   into of statins She saw a cardiologist to try to get PCYK9 inhibitor and it would not be covered  Hopefully that will change  LDL is improved / still very high   Hyperglycemia Lab Results  Component Value Date   HGBA1C 6.7 (H) 03/07/2017  was on vacation Has started cutting back on carbs -- plans to cut out salty snacks at night  Has stopped bread ,   Will work on other carbs  Not eating sweets or sweet drinks  Eating salad with fish on it for dinners  Up from 6.2   Thyroid- at risk for hypothyroidism  More sluggish due to her PT and lack of ultimate exercise No skin or chair exercise  Lab Results  Component Value Date   TSH 5.00 (H) 03/07/2017   this is improved from 6.88 Free T4 is normal     Chemistry  Component Value Date/Time   NA 138 03/07/2017 0929   K 3.9 03/07/2017 0929   K 4.0 05/07/2014 1139   CL 101 03/07/2017 0929   CO2 29 03/07/2017 0929   BUN 18 03/07/2017 0929   CREATININE 0.98 03/07/2017 0929      Component Value Date/Time   CALCIUM 9.8 03/07/2017 0929   ALKPHOS 74 03/07/2017 0929   AST 18 03/07/2017 0929   ALT 20 03/07/2017 0929   BILITOT 0.5 03/07/2017 0929     Lab Results  Component Value Date   WBC 8.2 03/07/2017   HGB 14.2 03/07/2017   HCT 41.7 03/07/2017   MCV 84.5 03/07/2017   PLT 273.0 03/07/2017   Patient Active Problem List   Diagnosis Date Noted  . Achilles tendonitis 01/01/2017  . Achilles tendon pain 12/31/2016  . Coughing 05/28/2016  . Osteopenia 02/13/2016  . Routine general medical examination at a health care facility 01/23/2016  . Estrogen deficiency 01/23/2016  . Abnormal TSH 01/23/2016  . Anxiety, generalized 02/21/2015  . H/O: HTN (hypertension) 02/21/2015  . Mild depression ([place])  02/21/2015  . H/O diabetes mellitus 01/04/2015  . H/O arthritis 01/04/2015  . History of hay fever 01/04/2015  . Encounter for Medicare annual wellness exam 01/14/2013  . Obesity 01/15/2012  . Stress reaction, emotional 12/26/2010  . MENOPAUSAL SYNDROME 10/04/2008  . Hyperglycemia 12/02/2007  . TREMOR 12/02/2007  . DEPRESSION 08/04/2007  . Hyperlipidemia 01/28/2007  . ANXIETY 01/28/2007  . Essential hypertension 01/28/2007  . GERD 01/28/2007  . URINARY INCONTINENCE 01/28/2007  . COLONIC POLYPS, HX OF 01/28/2007  . HERPES ZOSTER 01/20/2007  . DYSPHONIA 01/20/2007  . COUGH, CHRONIC 01/20/2007  . ALLERGY 01/20/2007   Past Medical History:  Diagnosis Date  . Allergic rhinitis    gets allergy shot from Dr. Carlis Abbott  . Anxiety   . Arthritis   . Diabetes mellitus without complication (HCC)    diet controlled  . GERD (gastroesophageal reflux disease)   . History of shingles    at age 60- mild  . Hx of colonic polyp   . Hyperglycemia   . Hyperlipemia    Very high chol intol of statins-does not want to check it  . Hypertension   . Panic disorder   . Tremor    from anx  . Urinary incontinence    mixed  . Voice disorder    Past Surgical History:  Procedure Laterality Date  . ABDOMINAL HYSTERECTOMY    . BLADDER SUSPENSION    . COLONOSCOPY WITH PROPOFOL N/A 12/24/2016   Procedure: COLONOSCOPY WITH PROPOFOL;  Surgeon: Manya Silvas, MD;  Location: [facility] ENDOSCOPY;  Service: Endoscopy;  Laterality: N/A;  . ESOPHAGOGASTRODUODENOSCOPY (EGD) WITH PROPOFOL N/A 12/24/2016   Procedure: ESOPHAGOGASTRODUODENOSCOPY (EGD) WITH PROPOFOL;  Surgeon: Manya Silvas, MD;  Location: [facility] ENDOSCOPY;  Service: Endoscopy;  Laterality: N/A;  . EYE SURGERY Bilateral 2000   eye lens replacement  . HAND SURGERY Left 2016   left long trigger finger release  . JOINT REPLACEMENT     knee replacement - march 15  . KNEE ARTHROSCOPY Left 04/26/2016   Procedure: ARTHROSCOPY KNEE;  Surgeon: Hessie Knows,  MD;  Location: [facility] ORS;  Service: Orthopedics;  Laterality: Left;  Marland Kitchen MEDIAL PARTIAL KNEE REPLACEMENT Left 2016  . NASAL SINUS SURGERY    . PARTIAL HYSTERECTOMY     with fibroid  . RECTOCELE REPAIR    . SYNOVECTOMY Left 04/26/2016   Procedure: PARTIAL SYNOVECTOMY;  Surgeon: Hessie Knows, MD;  Location:  [facility] ORS;  Service: Orthopedics;  Laterality: Left;  . TONSILLECTOMY     Social History  Substance Use Topics  . Smoking status: Never Smoker  . Smokeless tobacco: Never Used  . Alcohol use No   Family History  Problem Relation Age of Onset  . Diabetes Mother   . Hyperlipidemia Mother   . Hypertension Mother   . Osteoarthritis Mother   . Cancer Father        Colon  . Leukemia Sister   . Syncope episode Daughter    Allergies  Allergen Reactions  . Atorvastatin     REACTION: shoulder pain  . Ezetimibe     REACTION: ?  . Lisinopril     REACTION: muscle pain  . Molds & Smuts   . Penicillins     REACTION: reaction not known Has patient had a PCN reaction causing immediate rash, facial/tongue/throat swelling, SOB or lightheadedness with hypotension: unknown Has patient had a PCN reaction causing severe rash involving mucus membranes or skin necrosis: unknown Has patient had a PCN reaction that required hospitalization unsure Has patient had a PCN reaction occurring within the last 10 years: no If all of the above answers are "NO", then may proceed with Cephalosporin use.  . Pneumococcal Vaccine Swelling    REACTION: severe local reaction  . Pneumococcal Vaccine Polyvalent     REACTION: severe local reaction  . Pollen Extract   . Rosuvastatin     REACTION: leg pain  . Simvastatin     REACTION: muscle pain   Current Outpatient Prescriptions on File Prior to Visit  Medication Sig Dispense Refill  . ALPRAZolam (XANAX) 0.5 MG tablet Take 1 tablet (0.5 mg total) by mouth at bedtime as needed for anxiety. 90 tablet 1  . Azelastine HCl (ASTEPRO) 0.15 % SOLN 2 sprays by Nasal  route 2 (two) times daily as needed. 30 mL 11  . Calcium Carbonate-Vitamin D (CALCIUM 600+D PO) Take 1 tablet by mouth 2 (two) times daily.     . cetirizine (ZYRTEC) 10 MG tablet Take 10 mg by mouth daily.      Marland Kitchen EPINEPHrine 0.3 mg/0.3 mL IJ SOAJ injection Inject 0.3 mg into the skin once. as needed.    Marland Kitchen ibuprofen (ADVIL,MOTRIN) 200 MG tablet Take 200 mg by mouth every 6 (six) hours as needed for headache or moderate pain.    . Multiple Vitamin (MULTIVITAMIN) tablet Take 1 tablet by mouth daily.      . Omega-3 Fatty Acids (FISH OIL) 1200 MG CAPS Take 1 capsule by mouth daily.     . sertraline (ZOLOFT) 50 MG tablet Take 1.5 tablets (75 mg total) by mouth daily. 135 tablet 1   No current facility-administered medications on file prior to visit.     Review of Systems Review of Systems  Constitutional: Negative for fever, appetite change, fatigue and unexpected weight change.  Eyes: Negative for pain and visual disturbance.  Respiratory: Negative for cough and shortness of breath.   Cardiovascular: Negative for cp or palpitations    Gastrointestinal: Negative for nausea, diarrhea and constipation.  Genitourinary: Negative for urgency and frequency.  Skin: Negative for pallor or rash   MSK pos for foot pain  Neurological: Negative for weakness, light-headedness, numbness and headaches.  Hematological: Negative for adenopathy. Does not bruise/bleed easily.  Psychiatric/Behavioral: Negative for dysphoric mood. The patient is not nervous/anxious.         Objective:   Physical Exam  Constitutional: She appears well-developed and well-nourished. No  distress.  obese and well appearing   HENT:  Head: Normocephalic and atraumatic.  Right Ear: External ear normal.  Left Ear: External ear normal.  Mouth/Throat: Oropharynx is clear and moist.  Mild dysphonia baseline  Boggy nares   Eyes: Pupils are equal, round, and reactive to light. Conjunctivae and EOM are normal. No scleral icterus.    Neck: Normal range of motion. Neck supple. No JVD present. Carotid bruit is not present. No thyromegaly present.  Cardiovascular: Normal rate, regular rhythm, normal heart sounds and intact distal pulses.  Exam reveals no gallop.   Pulmonary/Chest: Effort normal and breath sounds normal. No respiratory distress. She has no wheezes. She exhibits no tenderness.  Abdominal: Soft. Bowel sounds are normal. She exhibits no distension, no abdominal bruit and no mass. There is no tenderness.  Genitourinary: No breast swelling, tenderness, discharge or bleeding.  Genitourinary Comments: Breast exam: No mass, nodules, thickening, tenderness, bulging, retraction, inflamation, nipple discharge or skin changes noted.  No axillary or clavicular LA.      Musculoskeletal: Normal range of motion. She exhibits no edema or tenderness.  Lymphadenopathy:    She has no cervical adenopathy.  Neurological: She is alert. She has normal reflexes. No cranial nerve deficit. She exhibits normal muscle tone. Coordination normal.  Mild hand tremor  Skin: Skin is warm and dry. No rash noted. No erythema. No pallor.  Some sks and lentigines   Psychiatric: She has a normal mood and affect.          Assessment & Plan:   Problem List Items Addressed This Visit      Cardiovascular and Mediastinum   Essential hypertension - Primary    bp in fair control at this time  BP Readings from Last 1 Encounters:  04/02/17 128/70   No changes needed Disc lifstyle change with low sodium diet and exercise  Labs reviewed       Relevant Medications   amLODipine (NORVASC) 5 MG tablet   ramipril (ALTACE) 10 MG capsule   triamterene-hydrochlorothiazide (MAXZIDE-25) 37.5-25 MG tablet     Musculoskeletal and Integument   Osteopenia    Mild osteopenia dexa 5/17 On ca and d with exercise No falls or fractures   Disc need for calcium/ vitamin D/ wt bearing exercise and bone density test every 2 y to monitor Disc safety/  fracture risk in detail          Other   Abnormal TSH    Lab Results  Component Value Date   TSH 5.00 (H) 03/07/2017   Free T4 is normal  Continue to watch Will likely become hypothyroid No symptoms       Hyperglycemia    Lab Results  Component Value Date   HGBA1C 6.7 (H) 03/07/2017   This is up from vacation eating  Will get back on track with DM diet  F/u 3 mo       Hyperlipidemia    Disc goals for lipids and reasons to control them Rev labs with pt Rev low sat fat diet in detail Intol to statins LDL over 200 Unfortunately pcyk9 inhibitor not covered        Relevant Medications   amLODipine (NORVASC) 5 MG tablet   ramipril (ALTACE) 10 MG capsule   triamterene-hydrochlorothiazide (MAXZIDE-25) 37.5-25 MG tablet   Mild depression (HCC)    Doing well  Continues psychiatric care       Obesity    Discussed how this problem influences overall health and the risks it  imposes  Reviewed plan for weight loss with lower calorie diet (via better food choices and also portion control or program like weight watchers) and exercise building up to or more than 30 minutes 5 days per week including some aerobic activity         Routine general medical examination at a health care facility    Reviewed health habits including diet and exercise and skin cancer prevention Reviewed appropriate screening tests for age  Also reviewed health mt list, fam hx and immunization status , as well as social and family history   See HPI Labs reviewed  AMW reviewed

## 2017-04-02 NOTE — Assessment & Plan Note (Signed)
Disc goals for lipids and reasons to control them Rev labs with pt Rev low sat fat diet in detail Intol to statins LDL over 200 Unfortunately pcyk9 inhibitor not covered

## 2017-04-02 NOTE — Assessment & Plan Note (Signed)
Lab Results  Component Value Date   HGBA1C 6.7 (H) 03/07/2017   This is up from vacation eating  Will get back on track with DM diet  F/u 3 mo

## 2017-04-12 ENCOUNTER — Other Ambulatory Visit: Payer: Self-pay | Admitting: Psychiatry

## 2017-06-10 ENCOUNTER — Other Ambulatory Visit: Payer: Self-pay | Admitting: Family Medicine

## 2017-06-10 DIAGNOSIS — Z1231 Encounter for screening mammogram for malignant neoplasm of breast: Secondary | ICD-10-CM

## 2017-06-25 ENCOUNTER — Ambulatory Visit
Admission: RE | Admit: 2017-06-25 | Discharge: 2017-06-25 | Disposition: A | Discharge status: Home / Self Care | Payer: Medicare Other | Source: Ambulatory Visit | Attending: Family Medicine | Admitting: Family Medicine

## 2017-06-25 DIAGNOSIS — Z1231 Encounter for screening mammogram for malignant neoplasm of breast: Secondary | ICD-10-CM | POA: Insufficient documentation

## 2017-06-26 ENCOUNTER — Other Ambulatory Visit (INDEPENDENT_AMBULATORY_CARE_PROVIDER_SITE_OTHER): Payer: Medicare Other

## 2017-06-26 ENCOUNTER — Other Ambulatory Visit: Payer: Medicare Other

## 2017-06-26 DIAGNOSIS — R739 Hyperglycemia, unspecified: Secondary | ICD-10-CM | POA: Diagnosis not present

## 2017-06-26 LAB — COMPREHENSIVE METABOLIC PANEL
ALK PHOS: 75 U/L (ref 39–117)
ALT: 17 U/L (ref 0–35)
AST: 16 U/L (ref 0–37)
Albumin: 4.4 g/dL (ref 3.5–5.2)
BILIRUBIN TOTAL: 0.6 mg/dL (ref 0.2–1.2)
BUN: 23 mg/dL (ref 6–23)
CALCIUM: 9.5 mg/dL (ref 8.4–10.5)
CO2: 25 meq/L (ref 19–32)
Chloride: 100 mEq/L (ref 96–112)
Creatinine, Ser: 1.02 mg/dL (ref 0.40–1.20)
GFR: 56.76 mL/min — AB (ref >60.00)
Glucose, Bld: 119 mg/dL — ABNORMAL HIGH (ref 70–99)
Potassium: 3.8 mEq/L (ref 3.5–5.1)
Sodium: 136 mEq/L (ref 135–145)
Total Protein: 7.1 g/dL (ref 6.0–8.3)

## 2017-06-26 LAB — HEMOGLOBIN A1C: Hgb A1c MFr Bld: 6.4 % (ref 4.6–6.5)

## 2017-07-03 ENCOUNTER — Encounter: Payer: Self-pay | Admitting: Family Medicine

## 2017-07-03 ENCOUNTER — Ambulatory Visit (INDEPENDENT_AMBULATORY_CARE_PROVIDER_SITE_OTHER): Payer: Medicare Other | Admitting: Family Medicine

## 2017-07-03 VITALS — BP 114/72 | HR 95 | Temp 98.0°F | Wt 177.0 lb

## 2017-07-03 DIAGNOSIS — I10 Essential (primary) hypertension: Secondary | ICD-10-CM | POA: Diagnosis not present

## 2017-07-03 DIAGNOSIS — E6609 Other obesity due to excess calories: Secondary | ICD-10-CM

## 2017-07-03 DIAGNOSIS — R739 Hyperglycemia, unspecified: Secondary | ICD-10-CM

## 2017-07-03 DIAGNOSIS — Z6832 Body mass index (BMI) 32.0-32.9, adult: Secondary | ICD-10-CM

## 2017-07-03 MED ORDER — TRIAMTERENE-HCTZ 37.5-25 MG PO TABS: 0.5000 | ORAL_TABLET | Freq: Every day | ORAL | 3 refills | Status: DC

## 2017-07-03 MED ORDER — AMLODIPINE BESYLATE 5 MG PO TABS: 2.5000 mg | ORAL_TABLET | Freq: Every day | ORAL | 3 refills | Status: DC

## 2017-07-03 NOTE — Progress Notes (Signed)
Subjective:    Patient ID: [name], female    DOB: 1946-08-31, 71 y.o.   MRN: [id number]  HPI  Here for f/u of chronic medical problems   Wt Readings from Last 3 Encounters:  07/03/17 177 lb (80.3 kg)  04/02/17 188 lb 8 oz (85.5 kg)  03/07/17 190 lb 8 oz (86.4 kg)  per pt she has lost 13.3 lb !  Very happy about this  Joined gym and doing water aerobics 3 d per weeks 30.15 kg/m   Achilles tendons are better after PT   Has changed diet quite a bit  Gave up salty carb snacks Rarely eats potatoes Small portions of protein with lots of veggies     Hyperglycemia Lab Results  Component Value Date   HGBA1C 6.4 06/26/2017   This is down from 6.7 She hopes it will come down more   bp is stable today  No cp or palpitations or headaches or edema  No side effects to medicines  BP Readings from Last 3 Encounters:  07/03/17 114/72  04/02/17 128/70  03/07/17 110/72    Having orthostatic symptoms  Cut her diuretic in 1/2  Will plan to decrease amlodipine to 2.5 mg    Lab Results  Component Value Date   CREATININE 1.02 06/26/2017   BUN 23 06/26/2017   NA 136 06/26/2017   K 3.8 06/26/2017   CL 100 06/26/2017   CO2 25 06/26/2017   Lab Results  Component Value Date   ALT 17 06/26/2017   AST 16 06/26/2017   ALKPHOS 75 06/26/2017   BILITOT 0.6 06/26/2017    Glucose 119   Patient Active Problem List   Diagnosis Date Noted  . Achilles tendonitis 01/01/2017  . Achilles tendon pain 12/31/2016  . Coughing 05/28/2016  . Osteopenia 02/13/2016  . Routine general medical examination at a health care facility 01/23/2016  . Estrogen deficiency 01/23/2016  . Abnormal TSH 01/23/2016  . Mild depression ([place]) 02/21/2015  . H/O diabetes mellitus 01/04/2015  . H/O arthritis 01/04/2015  . History of hay fever 01/04/2015  . Encounter for Medicare annual wellness exam 01/14/2013  . Obesity 01/15/2012  . Stress reaction, emotional 12/26/2010  . MENOPAUSAL SYNDROME  10/04/2008  . Hyperglycemia 12/02/2007  . TREMOR 12/02/2007  . DEPRESSION 08/04/2007  . Hyperlipidemia 01/28/2007  . ANXIETY 01/28/2007  . Essential hypertension 01/28/2007  . GERD 01/28/2007  . URINARY INCONTINENCE 01/28/2007  . COLONIC POLYPS, HX OF 01/28/2007  . HERPES ZOSTER 01/20/2007  . DYSPHONIA 01/20/2007  . COUGH, CHRONIC 01/20/2007  . ALLERGY 01/20/2007   Past Medical History:  Diagnosis Date  . Allergic rhinitis    gets allergy shot from Dr. Carlis Abbott  . Anxiety   . Arthritis   . Diabetes mellitus without complication (HCC)    diet controlled  . GERD (gastroesophageal reflux disease)   . History of shingles    at age 76- mild  . Hx of colonic polyp   . Hyperglycemia   . Hyperlipemia    Very high chol intol of statins-does not want to check it  . Hypertension   . Panic disorder   . Tremor    from anx  . Urinary incontinence    mixed  . Voice disorder    Past Surgical History:  Procedure Laterality Date  . ABDOMINAL HYSTERECTOMY    . BLADDER SUSPENSION    . COLONOSCOPY WITH PROPOFOL N/A 12/24/2016   Procedure: COLONOSCOPY WITH PROPOFOL;  Surgeon: Manya Silvas, MD;  Location: [facility] ENDOSCOPY;  Service: Endoscopy;  Laterality: N/A;  . ESOPHAGOGASTRODUODENOSCOPY (EGD) WITH PROPOFOL N/A 12/24/2016   Procedure: ESOPHAGOGASTRODUODENOSCOPY (EGD) WITH PROPOFOL;  Surgeon: Manya Silvas, MD;  [facility] ENDOSCOPY;  Service: Endoscopy;  Laterality: N/A;  . EYE SURGERY Bilateral 2000   eye lens replacement  . HAND SURGERY Left 2016   left long trigger finger release  . JOINT REPLACEMENT     knee replacement - march 15  . KNEE ARTHROSCOPY Left 04/26/2016   Procedure: ARTHROSCOPY KNEE;  Surgeon: Hessie Knows, MD;  Location: [facility] ORS;  Service: Orthopedics;  Laterality: Left;  Marland Kitchen MEDIAL PARTIAL KNEE REPLACEMENT Left 2016  . NASAL SINUS SURGERY    . PARTIAL HYSTERECTOMY     with fibroid  . RECTOCELE REPAIR    . SYNOVECTOMY Left 04/26/2016   Procedure: PARTIAL  SYNOVECTOMY;  Surgeon: Hessie Knows, MD;  Location: [facility] ORS;  Service: Orthopedics;  Laterality: Left;  . TONSILLECTOMY     Social History  Substance Use Topics  . Smoking status: Never Smoker  . Smokeless tobacco: Never Used  . Alcohol use No   Family History  Problem Relation Age of Onset  . Diabetes Mother   . Hyperlipidemia Mother   . Hypertension Mother   . Osteoarthritis Mother   . Cancer Father        Colon  . Leukemia Sister   . Syncope episode Daughter    Allergies  Allergen Reactions  . Atorvastatin     REACTION: shoulder pain  . Ezetimibe     REACTION: ?  . Lisinopril     REACTION: muscle pain  . Molds & Smuts   . Penicillins     REACTION: reaction not known Has patient had a PCN reaction causing immediate rash, facial/tongue/throat swelling, SOB or lightheadedness with hypotension: unknown Has patient had a PCN reaction causing severe rash involving mucus membranes or skin necrosis: unknown Has patient had a PCN reaction that required hospitalization unsure Has patient had a PCN reaction occurring within the last 10 years: no If all of the above answers are "NO", then may proceed with Cephalosporin use.  . Pneumococcal Vaccine Swelling    REACTION: severe local reaction  . Pneumococcal Vaccine Polyvalent     REACTION: severe local reaction  . Pollen Extract   . Rosuvastatin     REACTION: leg pain  . Simvastatin     REACTION: muscle pain   Current Outpatient Prescriptions on File Prior to Visit  Medication Sig Dispense Refill  . ALPRAZolam (XANAX) 0.5 MG tablet Take 1 tablet (0.5 mg total) by mouth at bedtime as needed for anxiety. 90 tablet 1  . Azelastine HCl (ASTEPRO) 0.15 % SOLN 2 sprays by Nasal route 2 (two) times daily as needed. 30 mL 11  . Calcium Carbonate-Vitamin D (CALCIUM 600+D PO) Take 1 tablet by mouth 2 (two) times daily.     . cetirizine (ZYRTEC) 10 MG tablet Take 10 mg by mouth daily.      Marland Kitchen EPINEPHrine 0.3 mg/0.3 mL IJ SOAJ injection  Inject 0.3 mg into the skin once. as needed.    Marland Kitchen ibuprofen (ADVIL,MOTRIN) 200 MG tablet Take 200 mg by mouth every 6 (six) hours as needed for headache or moderate pain.    . Multiple Vitamin (MULTIVITAMIN) tablet Take 1 tablet by mouth daily.      . Omega-3 Fatty Acids (FISH OIL) 1200 MG CAPS Take 1 capsule by mouth daily.     Marland Kitchen omeprazole (PRILOSEC) 20 MG  capsule TAKE 2 CAPSULES EVERY MORNING AND ONE EACH IN THE EVENING 270 capsule 3  . ramipril (ALTACE) 10 MG capsule TAKE 1 CAPSULE BY MOUTH EVERY DAY 90 capsule 3  . sertraline (ZOLOFT) 50 MG tablet Take 1.5 tablets (75 mg total) by mouth daily. 135 tablet 1   No current facility-administered medications on file prior to visit.     Review of Systems  Constitutional: Negative for activity change, appetite change, fatigue, fever and unexpected weight change.  HENT: Negative for congestion, ear pain, rhinorrhea, sinus pressure and sore throat.   Eyes: Negative for pain, redness and visual disturbance.  Respiratory: Negative for cough, shortness of breath and wheezing.   Cardiovascular: Negative for chest pain and palpitations.  Gastrointestinal: Negative for abdominal pain, blood in stool, constipation and diarrhea.  Endocrine: Negative for polydipsia and polyuria.  Genitourinary: Negative for dysuria, frequency and urgency.  Musculoskeletal: Negative for arthralgias, back pain and myalgias.       Pos for achilles pain that is improving   Skin: Negative for pallor and rash.  Allergic/Immunologic: Negative for environmental allergies.  Neurological: Negative for dizziness, syncope and headaches.  Hematological: Negative for adenopathy. Does not bruise/bleed easily.  Psychiatric/Behavioral: Negative for decreased concentration and dysphoric mood. The patient is not nervous/anxious.        Objective:   Physical Exam  Constitutional: She appears well-developed and well-nourished. No distress.  obese and well appearing   HENT:  Head:  Normocephalic and atraumatic.  Mouth/Throat: Oropharynx is clear and moist.  Baseline dysphonia   Eyes: Pupils are equal, round, and reactive to light. Conjunctivae and EOM are normal.  Neck: Normal range of motion. Neck supple. No JVD present. Carotid bruit is not present. No thyromegaly present.  Cardiovascular: Normal rate, regular rhythm, normal heart sounds and intact distal pulses.  Exam reveals no gallop.   Pulmonary/Chest: Effort normal and breath sounds normal. No respiratory distress. She has no wheezes. She has no rales.  No crackles  Abdominal: Soft. Bowel sounds are normal. She exhibits no distension, no abdominal bruit and no mass. There is no tenderness.  Musculoskeletal: She exhibits no edema.  Lymphadenopathy:    She has no cervical adenopathy.  Neurological: She is alert. She has normal reflexes. No cranial nerve deficit. She exhibits normal muscle tone. Coordination normal.  Skin: Skin is warm and dry. No rash noted.  Psychiatric: She has a normal mood and affect.          Assessment & Plan:   Problem List Items Addressed This Visit      Cardiovascular and Mediastinum   Essential hypertension - Primary    bp is lower with wt loss/diet /exercise  Will cut maxzide to 1/2 pill Also amlodipine 1/2 pill (2.5 total)  Watch bp  Update if no imp  BP: 114/72  Enc to keep up the good work      Relevant Medications   triamterene-hydrochlorothiazide (MAXZIDE-25) 37.5-25 MG tablet   amLODipine (NORVASC) 5 MG tablet     Other   Hyperglycemia    Starting to improve with diet/exercise and 12 lb wt loss Lab Results  Component Value Date   HGBA1C 6.4 06/26/2017   Continue to work on this  F/u July or earlier if needed       Obesity    Discussed how this problem influences overall health and the risks it imposes  Reviewed plan for weight loss with lower calorie diet (via better food choices and also portion control or  program like weight watchers) and exercise  building up to or more than 30 minutes 5 days per week including some aerobic activity   Commended on great job so far with lifestyle change

## 2017-07-03 NOTE — Assessment & Plan Note (Signed)
bp is lower with wt loss/diet /exercise  Will cut maxzide to 1/2 pill Also amlodipine 1/2 pill (2.5 total)  Watch bp  Update if no imp  BP: 114/72  Enc to keep up the good work

## 2017-07-03 NOTE — Assessment & Plan Note (Signed)
Discussed how this problem influences overall health and the risks it imposes  Reviewed plan for weight loss with lower calorie diet (via better food choices and also portion control or program like weight watchers) and exercise building up to or more than 30 minutes 5 days per week including some aerobic activity   Commended on great job so far with lifestyle change

## 2017-07-03 NOTE — Assessment & Plan Note (Signed)
Starting to improve with diet/exercise and 12 lb wt loss Lab Results  Component Value Date   HGBA1C 6.4 06/26/2017   Continue to work on this  F/u July or earlier if needed

## 2017-07-03 NOTE — Patient Instructions (Signed)
Cut back your amlodipine to 2.5 mg daily  If light headedness continues please alert me  Keep up the great work with diet and exercise   Follow up in July for next annual exam

## 2017-07-08 ENCOUNTER — Ambulatory Visit (INDEPENDENT_AMBULATORY_CARE_PROVIDER_SITE_OTHER): Payer: Medicare Other | Admitting: Psychiatry

## 2017-07-08 ENCOUNTER — Encounter: Payer: Self-pay | Admitting: Psychiatry

## 2017-07-08 VITALS — BP 122/76 | HR 101 | Temp 97.9°F | Wt 179.6 lb

## 2017-07-08 DIAGNOSIS — F411 Generalized anxiety disorder: Secondary | ICD-10-CM

## 2017-07-08 DIAGNOSIS — F331 Major depressive disorder, recurrent, moderate: Secondary | ICD-10-CM

## 2017-07-08 MED ORDER — ALPRAZOLAM 0.5 MG PO TABS: 0.5000 mg | ORAL_TABLET | Freq: Every evening | ORAL | 1 refills | Status: DC | PRN

## 2017-07-08 MED ORDER — SERTRALINE HCL 50 MG PO TABS: 75.0000 mg | ORAL_TABLET | Freq: Every day | ORAL | 1 refills | Status: DC

## 2017-07-08 NOTE — Progress Notes (Signed)
BH MD/PA/NP OP Progress Note  07/08/2017 10:44 AM [name]  MRN:  [id number]  Subjective:  Patient is a 71 year-old female who presented for the follow-up appointment. She reported that she has been doing well and has joined a water therapy in the past 6 months. She reported that she is feeling some back pain for the past 2 days. She did not go for her for water therapy appointments at the local gym. She reported that she has also lost 13 pounds as her hemoglobin A1c was elevated. She appeared happy and calm during the interview. She reported that the medications are helping her and she has been sleeping well on the combination of the Xanax and Zoloft at night. She is compliant with her medications. She denied having any suicidal homicidal ideations or plans. She reported that she feels alert during the daytime. She has been helping her husband during the night. She denied having any perceptual disturbances. She is looking forward to the holidays at this time.    She reported that she has been following with her family care physician on a regular basis.   Chief Complaint:  Chief Complaint    Follow-up; Medication Refill     Visit Diagnosis:     ICD-10-CM   1. MDD (major depressive disorder), recurrent episode, moderate (HCC) F33.1   2. Generalized anxiety disorder F41.1     Past Medical History:  Past Medical History:  Diagnosis Date  . Allergic rhinitis    gets allergy shot from Dr. Carlis Abbott  . Anxiety   . Arthritis   . Diabetes mellitus without complication (HCC)    diet controlled  . GERD (gastroesophageal reflux disease)   . History of shingles    at age 71- mild  . Hx of colonic polyp   . Hyperglycemia   . Hyperlipemia    Very high chol intol of statins-does not want to check it  . Hypertension   . Panic disorder   . Tremor    from anx  . Urinary incontinence    mixed  . Voice disorder     Past Surgical History:  Procedure Laterality Date  . ABDOMINAL HYSTERECTOMY     . BLADDER SUSPENSION    . COLONOSCOPY WITH PROPOFOL N/A 12/24/2016   Procedure: COLONOSCOPY WITH PROPOFOL;  Surgeon: Manya Silvas, MD;  Location: [facility] ENDOSCOPY;  Service: Endoscopy;  Laterality: N/A;  . ESOPHAGOGASTRODUODENOSCOPY (EGD) WITH PROPOFOL N/A 12/24/2016   Procedure: ESOPHAGOGASTRODUODENOSCOPY (EGD) WITH PROPOFOL;  Surgeon: Manya Silvas, MD;  Location: [facility] ENDOSCOPY;  Service: Endoscopy;  Laterality: N/A;  . EYE SURGERY Bilateral 2000   eye lens replacement  . HAND SURGERY Left 2016   left long trigger finger release  . JOINT REPLACEMENT     knee replacement - march 15  . KNEE ARTHROSCOPY Left 04/26/2016   Procedure: ARTHROSCOPY KNEE;  Surgeon: Hessie Knows, MD;  Location: [facility] ORS;  Service: Orthopedics;  Laterality: Left;  Marland Kitchen MEDIAL PARTIAL KNEE REPLACEMENT Left 2016  . NASAL SINUS SURGERY    . PARTIAL HYSTERECTOMY     with fibroid  . RECTOCELE REPAIR    . SYNOVECTOMY Left 04/26/2016   Procedure: PARTIAL SYNOVECTOMY;  Surgeon: Hessie Knows, MD;  Location: [facility] ORS;  Service: Orthopedics;  Laterality: Left;  . TONSILLECTOMY     Family History:  Family History  Problem Relation Age of Onset  . Diabetes Mother   . Hyperlipidemia Mother   . Hypertension Mother   . Osteoarthritis Mother   .  Cancer Father        Colon  . Leukemia Sister   . Syncope episode Daughter    Social History:  Social History   Social History  . Marital status: Married    Spouse name: N/A  . Number of children: 2  . 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 Smoking status: Never Smoker  . Smokeless tobacco: Never Used  . Alcohol use No  . Drug use: No  . Sexual activity: No   Other Topics Concern  . None   Social History Narrative  . None   Additional History:  Lives with husband. She stated that she spends time by exercise and doing hosehold chores. She stated that she is having  issues due to knee replacement now.   Assessment:   Musculoskeletal: Strength & Muscle Tone: within normal limits Gait & Station: normal Patient leans: N/A  Psychiatric Specialty Exam: Medication Refill  Associated symptoms include myalgias. Pertinent negatives include no vomiting.    Review of Systems  Constitutional: Negative for weight loss.  HENT: Negative for hearing loss.   Eyes: Negative for photophobia.  Respiratory: Negative for hemoptysis.   Cardiovascular: Negative for palpitations.  Gastrointestinal: Negative for vomiting.  Genitourinary: Negative for urgency.  Musculoskeletal: Positive for back pain, joint pain and myalgias.  Neurological: Negative for tremors.  Endo/Heme/Allergies: Negative for environmental allergies.  Psychiatric/Behavioral: Negative for depression and hallucinations. The patient has insomnia. The patient is not nervous/anxious.   All other systems reviewed and are negative.   Blood pressure 122/76, pulse (!) 101, temperature 97.9 F (36.6 C), temperature source Oral, weight 179 lb 9.6 oz (81.5 kg), last menstrual period 09/18/1987.Body mass index is 30.59 kg/m.  General Appearance: Casual  Eye Contact:  Fair  Speech:  Clear and Coherent  Volume:  Normal  Mood:  Anxious  Affect:  Appropriate  Thought Process:  Logical  Orientation:  Full (Time, Place, and Person)  Thought Content:  WDL  Suicidal Thoughts:  No  Homicidal Thoughts:  No  Memory:  Immediate;   Good  Judgement:  Fair  Insight:  Fair  Psychomotor Activity:  Normal  Concentration:  Fair  Recall:  [employer] of Knowledge: Fair  Language: Fair  Akathisia:  No  Handed:  Right  AIMS (if indicated):  none  Assets:  Communication Skills  ADL's:  Intact  Cognition: WNL  Sleep:  5-6    Is the patient at risk to self?  No. Has the patient been a risk to self in the past 6 months?  No. Has the patient been a risk to self within the distant past?  No. Is the patient a risk to  others?  No. Has the patient been a risk to others in the past 6 months?  No.   Has the patient been a risk to others within the distant past?  No.  Current Medications: Current Outpatient Prescriptions  Medication Sig Dispense Refill  . ALPRAZolam (XANAX) 0.5 MG tablet Take 1 tablet (0.5 mg total) by mouth at bedtime as needed for anxiety. 90 tablet 1  . amLODipine (NORVASC) 5 MG tablet Take 0.5 tablets (2.5 mg total) by mouth daily. 45 tablet 3  . Azelastine HCl (ASTEPRO) 0.15 % SOLN 2 sprays by Nasal route 2 (two) times daily as needed. 30 mL 11  . Calcium Carbonate-Vitamin D (CALCIUM 600+D PO) Take  1 tablet by mouth 2 (two) times daily.     . cetirizine (ZYRTEC) 10 MG tablet Take 10 mg by mouth daily.      Marland Kitchen EPINEPHrine 0.3 mg/0.3 mL IJ SOAJ injection Inject 0.3 mg into the skin once. as needed.    Marland Kitchen ibuprofen (ADVIL,MOTRIN) 200 MG tablet Take 200 mg by mouth every 6 (six) hours as needed for headache or moderate pain.    . Multiple Vitamin (MULTIVITAMIN) tablet Take 1 tablet by mouth daily.      . Omega-3 Fatty Acids (FISH OIL) 1200 MG CAPS Take 1 capsule by mouth daily.     Marland Kitchen omeprazole (PRILOSEC) 20 MG capsule TAKE 2 CAPSULES EVERY MORNING AND ONE EACH IN THE EVENING 270 capsule 3  . ramipril (ALTACE) 10 MG capsule TAKE 1 CAPSULE BY MOUTH EVERY DAY 90 capsule 3  . sertraline (ZOLOFT) 50 MG tablet Take 1.5 tablets (75 mg total) by mouth daily. 135 tablet 1  . triamterene-hydrochlorothiazide (MAXZIDE-25) 37.5-25 MG tablet Take 0.5 tablets by mouth daily. 45 tablet 3   No current facility-administered medications for this visit.     Medical Decision Making:  Established Problem, Stable/Improving (1)  Treatment Plan Summary:Medication management   Discussed with patient about her medications and   She will continue her on the medications as follows Zoloft 75 mg by mouth daily  Prescribed Xanax  0.5  milligram by mouth  qhs .   Patient will be given six-month supply of the  medications.    Follow-up in 6  months    More than 50% of the time spent in psychoeducation, counseling and coordination of care.    This note was generated in part or whole with voice recognition software. Voice regonition is usually quite accurate but there are transcription errors that can and very often do occur. I apologize for any typographical errors that were not detected and corrected.   Rainey Pines, MD  07/08/2017, 10:44 AM

## 2017-07-10 ENCOUNTER — Ambulatory Visit: Payer: 59 | Admitting: Psychiatry

## 2017-08-30 ENCOUNTER — Other Ambulatory Visit: Payer: Self-pay | Admitting: Psychiatry

## 2017-10-08 ENCOUNTER — Other Ambulatory Visit: Payer: Self-pay | Admitting: Psychiatry

## 2017-10-18 ENCOUNTER — Other Ambulatory Visit: Payer: Self-pay | Admitting: Family Medicine

## 2017-11-25 ENCOUNTER — Other Ambulatory Visit: Payer: Self-pay | Admitting: Psychiatry

## 2017-11-25 NOTE — Telephone Encounter (Signed)
Pt was given 6 months supply of her meds on 10/22. Please call and confirm when she last filled her meds.

## 2017-11-25 NOTE — Telephone Encounter (Signed)
pt called and left a message that she needs enough medication to due until her next appt on  01-06-18 . pt was last seen on  07-08-17. pt needs xanax and sertraline.

## 2017-12-12 ENCOUNTER — Ambulatory Visit: Payer: Self-pay

## 2017-12-12 NOTE — Telephone Encounter (Signed)
Noted. Thanks. Will see tomorrow.  

## 2017-12-12 NOTE — Telephone Encounter (Signed)
Phone call from pt. with report of 2 day hx of sore throat, runny nose, post nasal drip, nonproductive cough, and headache.  Reported fever of 102 degrees @ 1:00 PM today; took Ibuprofen at that time.  Reported has been taking Mucinex and Astelin Nasal Spray; feels her symptoms are worsening.  Voiced concern of having this turn in to pneumonia.  Recommended to go to UC, due to fever and age.  The pt. declined going to UC.  Stated, "I think I can wait until tomorrow; I don't think this is an emergency."      Contacted FC; advised of pt's symptoms, and of declining to go to UC.  Per FC, okay to schedule in office tomorrow, with pt. being agreeable to go to UC tonight, if symptoms worsen.     Returned call to pt.  Encouraged to go to UC if symptoms worsen tonight.  Appt. given for 9:00 AM 3/29.  Verb. Understanding and agreed with plan.       Reason for Disposition . [1] Fever > 101 F (38.3 C) AND [2] age > 80    Advised to go to Urgent Care.  Declined to go to UC this evening.  Is requesting to get an appt. For tomorrow.  Answer Assessment - Initial Assessment Questions 1. TEMPERATURE: "What is the most recent temperature?"  "How was it measured?"      Fever 3/27 of 101 degrees ;  102 degrees today at 1:00 PM; took dose of Ibuprofen  2. ONSET: "When did the fever start?"      Wed. 12/11/17 3. SYMPTOMS: "Do you have any other symptoms besides the fever?"  (e.g., colds, headache, sore throat, earache, cough, rash, diarrhea, vomiting, abdominal pain)     Sore throat, nonproductive cough, clear nasal congestion, post nasal drip, headache 4. CAUSE: If there are no symptoms, ask: "What do you think is causing the fever?"      Flu like symptoms 5. CONTACTS: "Does anyone else in the family have an infection?"     No family members 6. TREATMENT: "What have you done so far to treat this fever?" (e.g., medications)     Mucinex and Astelin nasal spray  7. IMMUNOCOMPROMISE: "Do you have of the following:  diabetes, HIV positive, splenectomy, cancer chemotherapy, chronic steroid treatment, transplant patient, etc."     Borderline diabetic  8. PREGNANCY: "Is there any chance you are pregnant?" "When was your last menstrual period?"    N/a 9. TRAVEL: "Have you traveled out of the country in the last month?" (e.g., travel history, exposures)    No  Protocols used: FEVER-A-AH

## 2017-12-13 ENCOUNTER — Ambulatory Visit: Payer: Medicare Other | Admitting: Family Medicine

## 2017-12-13 ENCOUNTER — Encounter: Payer: Self-pay | Admitting: Family Medicine

## 2017-12-13 DIAGNOSIS — R6889 Other general symptoms and signs: Secondary | ICD-10-CM | POA: Diagnosis not present

## 2017-12-13 MED ORDER — BENZONATATE 200 MG PO CAPS: 200.0000 mg | ORAL_CAPSULE | Freq: Three times a day (TID) | ORAL | 1 refills | Status: DC | PRN

## 2017-12-13 MED ORDER — ALBUTEROL SULFATE HFA 108 (90 BASE) MCG/ACT IN AERS: 1.0000 | INHALATION_SPRAY | Freq: Four times a day (QID) | RESPIRATORY_TRACT | 0 refills | Status: DC | PRN

## 2017-12-13 MED ORDER — OSELTAMIVIR PHOSPHATE 75 MG PO CAPS: 75.0000 mg | ORAL_CAPSULE | Freq: Two times a day (BID) | ORAL | 0 refills | Status: DC

## 2017-12-13 NOTE — Patient Instructions (Addendum)
Presumed flu.  Rest and fluids.  Hold the amlodipine for a few days.  If your BP goes back up then okay to restart 1/2 tab a day when feeling better.  If your BP is still <130/<90 then you don't need to restart it.   Take tesssalon pills for cough.  If needed, then use the inhaler.  Start tamiflu in the meantime.  Take care.  Glad to see you.

## 2017-12-13 NOTE — Progress Notes (Signed)
BP is lower today.  She isn't lightheaded.    Sx started about 2 days ago, quick onset.  First noted ST, then post nasal gtt.  Had rhinorrhea, sneezing.  Coughing through the day and night.  Fever up to 102 yesterday afternoon.  No myalgias but ribs are sore from coughing.   No vomiting, no diarrhea.  No rash.  Had a flu shot in the fall.  No ear pain.    Meds, vitals, and allergies reviewed.   ROS: Per HPI unless specifically indicated in ROS section   GEN: nad, alert and oriented HEENT: mucous membranes moist, tm w/o erythema, nasal exam w/o erythema, clear discharge noted,  OP with cobblestoning NECK: supple w/o LA CV: rrr.   PULM: ctab, no inc wob EXT: no edema SKIN: no acute rash

## 2017-12-13 NOTE — Assessment & Plan Note (Signed)
Presumed flu.  Rest and fluids.  Start tamiflu in the meantime. Routine cautions d/w pt.   Tesssalon pills for cough.  If needed, then use SABA.  Hold the amlodipine for a few days.  If BP goes back up then okay to restart 1/2 tab a day when feeling better.  If BP is still <130/<90 then no need to restart it.   Nontoxic.  Okay for outpatient f/u.

## 2018-01-06 ENCOUNTER — Telehealth: Payer: Self-pay

## 2018-01-06 ENCOUNTER — Ambulatory Visit: Payer: Medicare Other | Admitting: Psychiatry

## 2018-01-06 MED ORDER — ALPRAZOLAM 0.5 MG PO TABS: 0.5000 mg | ORAL_TABLET | Freq: Every evening | ORAL | 0 refills | Status: DC | PRN

## 2018-01-06 NOTE — Telephone Encounter (Signed)
Pt was called today because dr. Gretel Acre called out pt was supposed to have been seen today but due to dr. Gretel Acre calling out sick pt was r/s for 4-29 can you send in enough xanax just until pt comes in for appt.    ALPRAZolam (XANAX) 0.5 MG tablet 90 tablet 1 07/08/2017    Sig - Route: Take 1 tablet (0.5 mg total) by mouth at bedtime as needed for anxiety. - Oral   Class: Print    This is a dr. Gretel Acre pt

## 2018-01-06 NOTE — Telephone Encounter (Signed)
Since patient missed today's appointment with Dr. Gretel Acre, per request from patient will give her 7 pills of Xanax to last her until her next appointment with Dr. Gretel Acre next week.  I have reviewed [place] controlled substance database and her last refill was on 10/08/2017.

## 2018-01-06 NOTE — Telephone Encounter (Signed)
Pt notified that 7 pills was sent to pharmacy

## 2018-01-11 ENCOUNTER — Other Ambulatory Visit: Payer: Self-pay | Admitting: Family Medicine

## 2018-01-13 ENCOUNTER — Encounter: Payer: Self-pay | Admitting: Psychiatry

## 2018-01-13 ENCOUNTER — Ambulatory Visit: Payer: Medicare Other | Admitting: Psychiatry

## 2018-01-13 ENCOUNTER — Other Ambulatory Visit: Payer: Self-pay

## 2018-01-13 VITALS — BP 128/71 | HR 81 | Temp 98.4°F | Wt 171.2 lb

## 2018-01-13 DIAGNOSIS — F411 Generalized anxiety disorder: Secondary | ICD-10-CM

## 2018-01-13 DIAGNOSIS — F331 Major depressive disorder, recurrent, moderate: Secondary | ICD-10-CM | POA: Diagnosis not present

## 2018-01-13 MED ORDER — SERTRALINE HCL 50 MG PO TABS: 75.0000 mg | ORAL_TABLET | Freq: Every day | ORAL | 1 refills | Status: DC

## 2018-01-13 MED ORDER — ALPRAZOLAM 0.5 MG PO TABS: 0.5000 mg | ORAL_TABLET | Freq: Every evening | ORAL | 5 refills | Status: DC | PRN

## 2018-01-13 MED ORDER — ALPRAZOLAM 0.5 MG PO TABS: 0.5000 mg | ORAL_TABLET | Freq: Every evening | ORAL | 2 refills | Status: DC | PRN

## 2018-01-13 NOTE — Progress Notes (Signed)
BH MD/PA/NP OP Progress Note  01/13/2018 11:41 AM [name]  MRN:  [id number]  Subjective:  Patient is a 72 year-old female who presented for the follow-up appointment. She reported that she has been doing well and has joined a water therapy since September.  She reported that she has been doing well and it has been helping with her back pain as well as knee pain.  She has been going at least 3 times per week.  Patient reported that it makes her keep calm.  Patient reported that she does not do any other exercise as it did affect her arthritis.  She has been compliant with her medications.  She denies having any side effects.  She has been sleeping well at night.  She appeared calm and alert during the interview.  She denied having any suicidal or homicidal ideations or plans.  Chief Complaint:  Chief Complaint    Follow-up; Medication Refill     Visit Diagnosis:     ICD-10-CM   1. MDD (major depressive disorder), recurrent episode, moderate (HCC) F33.1   2. Generalized anxiety disorder F41.1     Past Medical History:  Past Medical History:  Diagnosis Date  . Allergic rhinitis    gets allergy shot from Dr. Carlis Abbott  . Anxiety   . Arthritis   . Diabetes mellitus without complication (HCC)    diet controlled  . GERD (gastroesophageal reflux disease)   . History of shingles    at age 44- mild  . Hx of colonic polyp   . Hyperglycemia   . Hyperlipemia    Very high chol intol of statins-does not want to check it  . Hypertension   . Panic disorder   . Tremor    from anx  . Urinary incontinence    mixed  . Voice disorder     Past Surgical History:  Procedure Laterality Date  . ABDOMINAL HYSTERECTOMY    . BLADDER SUSPENSION    . COLONOSCOPY WITH PROPOFOL N/A 12/24/2016   Procedure: COLONOSCOPY WITH PROPOFOL;  Surgeon: Manya Silvas, MD;  Location: [facility] ENDOSCOPY;  Service: Endoscopy;  Laterality: N/A;  . ESOPHAGOGASTRODUODENOSCOPY (EGD) WITH PROPOFOL N/A 12/24/2016   Procedure:  ESOPHAGOGASTRODUODENOSCOPY (EGD) WITH PROPOFOL;  Surgeon: Manya Silvas, MD;  Location: [facility] ENDOSCOPY;  Service: Endoscopy;  Laterality: N/A;  . EYE SURGERY Bilateral 2000   eye lens replacement  . HAND SURGERY Left 2016   left long trigger finger release  . JOINT REPLACEMENT     knee replacement - march 15  . KNEE ARTHROSCOPY Left 04/26/2016   Procedure: ARTHROSCOPY KNEE;  Surgeon: Hessie Knows, MD;  Location: [facility] ORS;  Service: Orthopedics;  Laterality: Left;  Marland Kitchen MEDIAL PARTIAL KNEE REPLACEMENT Left 2016  . NASAL SINUS SURGERY    . PARTIAL HYSTERECTOMY     with fibroid  . RECTOCELE REPAIR    . SYNOVECTOMY Left 04/26/2016   Procedure: PARTIAL SYNOVECTOMY;  Surgeon: Hessie Knows, MD;  Location: [facility] ORS;  Service: Orthopedics;  Laterality: Left;  . TONSILLECTOMY     Family History:  Family History  Problem Relation Age of Onset  . Diabetes Mother   . Hyperlipidemia Mother   . Hypertension Mother   . Osteoarthritis Mother   . Cancer Father        Colon  . Leukemia Sister   . Syncope episode Daughter    Social History:  Social History   Socioeconomic History  . Marital status: Married    Spouse name:  Not on file  . Number of children: 2  . Years of education: Not on file  . Highest education level: Not on file  Occupational History  . 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 Financial resource strain: Not on file  . Food insecurity:    Worry: Not on file    Inability: Not on file  . Transportation needs:    Medical: Not on file    Non-medical: Not on file  Tobacco Use  . Smoking status: Never Smoker  . Smokeless tobacco: Never Used  Substance and Sexual Activity  . Alcohol use: No    Alcohol/week: 0.0 oz  . Drug use: No  . Sexual activity: Never    Birth control/protection: None  Lifestyle  . Physical activity:    Days per week: Not on file    Minutes per session: Not on file  . Stress: Not on file   Relationships  . Social connections:    Talks on phone: Not on file    Gets together: Not on file    Attends religious service: Not on file    Active member of club or organization: Not on file    Attends meetings of clubs or organizations: Not on file    Relationship status: Not on file  Other Topics Concern  . Not on file  Social History Narrative  . Not on file   Additional History:  Lives with husband. She stated that she spends time by exercise and doing hosehold chores. She stated that she is having issues due to knee replacement now.   Assessment:   Musculoskeletal: Strength & Muscle Tone: within normal limits Gait & Station: normal Patient leans: N/A  Psychiatric Specialty Exam: Medication Refill  Associated symptoms include myalgias. Pertinent negatives include no vomiting.    Review of Systems  Constitutional: Negative for weight loss.  HENT: Negative for hearing loss.   Eyes: Negative for photophobia.  Respiratory: Negative for hemoptysis.   Cardiovascular: Negative for palpitations.  Gastrointestinal: Negative for vomiting.  Genitourinary: Negative for urgency.  Musculoskeletal: Positive for back pain, joint pain and myalgias.  Neurological: Negative for tremors.  Endo/Heme/Allergies: Negative for environmental allergies.  Psychiatric/Behavioral: Negative for depression and hallucinations. The patient has insomnia. The patient is not nervous/anxious.   All other systems reviewed and are negative.   Blood pressure 128/71, pulse 81, temperature 98.4 F (36.9 C), temperature source Oral, weight 171 lb 3.2 oz (77.7 kg), last menstrual period 09/18/1987.Body mass index is 29.16 kg/m.  General Appearance: Casual  Eye Contact:  Fair  Speech:  Clear and Coherent  Volume:  Normal  Mood:  Anxious  Affect:  Appropriate  Thought Process:  Logical  Orientation:  Full (Time, Place, and Person)  Thought Content:  WDL  Suicidal Thoughts:  No  Homicidal Thoughts:   No  Memory:  Immediate;   Good  Judgement:  Fair  Insight:  Fair  Psychomotor Activity:  Normal  Concentration:  Fair  Recall:  [employer] of Knowledge: Fair  Language: Fair  Akathisia:  No  Handed:  Right  AIMS (if indicated):  none  Assets:  Communication Skills  ADL's:  Intact  Cognition: WNL  Sleep:  5-6    Is the patient at risk to self?  No. Has the patient been a risk to self in the past 6 months?  No. Has the patient been a risk to self within the distant past?  No. Is the patient a risk to others?  No. Has the patient been a risk to others in the past 6 months?  No.   Has the patient been a risk to others within the distant past?  No.  Current Medications: Current Outpatient Medications  Medication Sig Dispense Refill  . ALPRAZolam (XANAX) 0.5 MG tablet Take 1 tablet (0.5 mg total) by mouth at bedtime as needed for anxiety. 7 tablet 0  . Azelastine HCl (ASTEPRO) 0.15 % SOLN 2 sprays by Nasal route 2 (two) times daily as needed. 30 mL 11  . Calcium Carbonate-Vitamin D (CALCIUM 600+D PO) Take 1 tablet by mouth 2 (two) times daily.     . cetirizine (ZYRTEC) 10 MG tablet Take 10 mg by mouth daily.      Marland Kitchen EPINEPHrine 0.3 mg/0.3 mL IJ SOAJ injection Inject 0.3 mg into the skin once. as needed.    . Multiple Vitamin (MULTIVITAMIN) tablet Take 1 tablet by mouth daily.      . Omega-3 Fatty Acids (FISH OIL) 1200 MG CAPS Take 1 capsule by mouth daily.     Marland Kitchen omeprazole (PRILOSEC) 20 MG capsule TAKE 2 CAPSULES EVERY MORNING AND ONE EACH IN THE EVENING 270 capsule 3  . ramipril (ALTACE) 10 MG capsule TAKE 1 CAPSULE BY MOUTH EVERY DAY 90 capsule 3  . sertraline (ZOLOFT) 50 MG tablet Take 1.5 tablets (75 mg total) by mouth daily. 135 tablet 1  . triamterene-hydrochlorothiazide (MAXZIDE-25) 37.5-25 MG tablet Take 0.5 tablets by mouth daily. 45 tablet 3   No current facility-administered medications for this visit.     Medical Decision Making:  Established Problem, Stable/Improving  (1)  Treatment Plan Summary:Medication management   Discussed with patient about her medications and   She will continue her on the medications as follows Zoloft 75 mg by mouth daily  Prescribed Xanax  0.5  milligram by mouth  qhs .   Patient will be given six-month supply of the medications.    Follow-up in 6  months    More than 50% of the time spent in psychoeducation, counseling and coordination of care.    This note was generated in part or whole with voice recognition software. Voice regonition is usually quite accurate but there are transcription errors that can and very often do occur. I apologize for any typographical errors that were not detected and corrected.   Rainey Pines, MD  01/13/2018, 11:41 AM

## 2018-02-06 ENCOUNTER — Encounter: Payer: Self-pay | Admitting: Family Medicine

## 2018-03-23 ENCOUNTER — Telehealth: Payer: Self-pay | Admitting: Family Medicine

## 2018-03-23 DIAGNOSIS — R739 Hyperglycemia, unspecified: Secondary | ICD-10-CM

## 2018-03-23 DIAGNOSIS — R7989 Other specified abnormal findings of blood chemistry: Secondary | ICD-10-CM

## 2018-03-23 DIAGNOSIS — Z8639 Personal history of other endocrine, nutritional and metabolic disease: Secondary | ICD-10-CM

## 2018-03-23 DIAGNOSIS — E7800 Pure hypercholesterolemia, unspecified: Secondary | ICD-10-CM

## 2018-03-23 DIAGNOSIS — I10 Essential (primary) hypertension: Secondary | ICD-10-CM

## 2018-03-23 NOTE — Telephone Encounter (Signed)
-----   Message from Eustace Pen, LPN sent at 12/18/6065  4:05 PM EDT ----- Regarding: Labs 7/12 Lab orders needed. Thank you.  Insurance:  [employer] [facility]

## 2018-03-28 ENCOUNTER — Ambulatory Visit (INDEPENDENT_AMBULATORY_CARE_PROVIDER_SITE_OTHER): Payer: Medicare Other

## 2018-03-28 VITALS — BP 102/78 | HR 87 | Temp 98.9°F | Ht 64.5 in | Wt 169.5 lb

## 2018-03-28 DIAGNOSIS — Z8639 Personal history of other endocrine, nutritional and metabolic disease: Secondary | ICD-10-CM

## 2018-03-28 DIAGNOSIS — R739 Hyperglycemia, unspecified: Secondary | ICD-10-CM

## 2018-03-28 DIAGNOSIS — Z0000 Encounter for general adult medical examination without abnormal findings: Secondary | ICD-10-CM | POA: Diagnosis not present

## 2018-03-28 DIAGNOSIS — E7800 Pure hypercholesterolemia, unspecified: Secondary | ICD-10-CM

## 2018-03-28 DIAGNOSIS — I10 Essential (primary) hypertension: Secondary | ICD-10-CM

## 2018-03-28 DIAGNOSIS — R7989 Other specified abnormal findings of blood chemistry: Secondary | ICD-10-CM | POA: Diagnosis not present

## 2018-03-28 LAB — T4, FREE: Free T4: 0.89 ng/dL (ref 0.60–1.60)

## 2018-03-28 LAB — CBC WITH DIFFERENTIAL/PLATELET
BASOS PCT: 0.6 % (ref 0.0–3.0)
Basophils Absolute: 0 10*3/uL (ref 0.0–0.1)
EOS PCT: 1.4 % (ref 0.0–5.0)
Eosinophils Absolute: 0.1 10*3/uL (ref 0.0–0.7)
HEMATOCRIT: 44.2 % (ref 36.0–46.0)
HEMOGLOBIN: 14.9 g/dL (ref 12.0–15.0)
LYMPHS PCT: 32.1 % (ref 12.0–46.0)
Lymphs Abs: 2.3 10*3/uL (ref 0.7–4.0)
MCHC: 33.8 g/dL (ref 30.0–36.0)
MCV: 86.1 fl (ref 78.0–100.0)
MONOS PCT: 9.1 % (ref 3.0–12.0)
Monocytes Absolute: 0.7 10*3/uL (ref 0.1–1.0)
NEUTROS ABS: 4.2 10*3/uL (ref 1.4–7.7)
Neutrophils Relative %: 56.8 % (ref 43.0–77.0)
Platelets: 262 10*3/uL (ref 150.0–400.0)
RBC: 5.14 Mil/uL — ABNORMAL HIGH (ref 3.87–5.11)
RDW: 13.6 % (ref 11.5–15.5)
WBC: 7.3 10*3/uL (ref 4.0–10.5)

## 2018-03-28 LAB — LIPID PANEL
CHOLESTEROL: 309 mg/dL — AB (ref 0–200)
HDL: 48.5 mg/dL (ref >39.00)
LDL CALC: 242 mg/dL — AB (ref 0–99)
NonHDL: 260.12
TRIGLYCERIDES: 91 mg/dL (ref 0.0–149.0)
Total CHOL/HDL Ratio: 6
VLDL: 18.2 mg/dL (ref 0.0–40.0)

## 2018-03-28 LAB — COMPREHENSIVE METABOLIC PANEL
ALBUMIN: 4.2 g/dL (ref 3.5–5.2)
ALT: 17 U/L (ref 0–35)
AST: 19 U/L (ref 0–37)
Alkaline Phosphatase: 73 U/L (ref 39–117)
BUN: 27 mg/dL — ABNORMAL HIGH (ref 6–23)
CHLORIDE: 100 meq/L (ref 96–112)
CO2: 28 mEq/L (ref 19–32)
CREATININE: 0.99 mg/dL (ref 0.40–1.20)
Calcium: 9.7 mg/dL (ref 8.4–10.5)
GFR: 58.62 mL/min — AB (ref >60.00)
Glucose, Bld: 117 mg/dL — ABNORMAL HIGH (ref 70–99)
POTASSIUM: 4.3 meq/L (ref 3.5–5.1)
Sodium: 137 mEq/L (ref 135–145)
Total Bilirubin: 0.7 mg/dL (ref 0.2–1.2)
Total Protein: 6.9 g/dL (ref 6.0–8.3)

## 2018-03-28 LAB — HEMOGLOBIN A1C: HEMOGLOBIN A1C: 6.3 % (ref 4.6–6.5)

## 2018-03-28 LAB — TSH: TSH: 5.66 u[IU]/mL — ABNORMAL HIGH (ref 0.35–4.50)

## 2018-03-28 NOTE — Progress Notes (Signed)
PCP notes:   Health maintenance:  No gaps identified.   Abnormal screenings:   None  Patient concerns:   None  Nurse concerns:   None  Next PCP appt:   04/04/18 @ 0930  I reviewed health advisor's note, was available for consultation, and agree with documentation and plan.  Loura Pardon MD

## 2018-03-28 NOTE — Progress Notes (Signed)
Subjective:   [name] is a 72 y.o. female who presents for Medicare Annual (Subsequent) preventive examination.  Review of Systems:  N/A Cardiac Risk Factors include: advanced age (>66men, >48 women);dyslipidemia;obesity (BMI >30kg/m2);hypertension     Objective:     Vitals: BP 102/78 (BP Location: Right Arm, Patient Position: Sitting, Cuff Size: Normal)   Pulse 87   Temp 98.9 F (37.2 C) (Oral)   Ht 5' 4.5" (1.638 m) Comment: shoes  Wt 169 lb 8 oz (76.9 kg)   LMP 09/18/1987   SpO2 97%   BMI 28.65 kg/m   Body mass index is 28.65 kg/m.  Advanced Directives 03/28/2018 04/02/2017 03/07/2017 12/24/2016 04/26/2016 04/10/2016 03/07/2016  Does Patient Have a Medical Advance Directive? Yes Yes Yes No Yes Yes Yes  Type of Paramedic of [place];Living will [place];Living will [place];Living will - [place];Living will [place];Living will [place];Living will  Does patient want to make changes to medical advance directive? - - - - No - Patient declined No - Patient declined No - Patient declined  Copy of [place] in Chart? No - copy requested Yes No - copy requested - No - copy requested No - copy requested No - copy requested  Would patient like information on creating a medical advance directive? - - - - - - No - patient declined information  Some encounter information is confidential and restricted. Go to Review Flowsheets activity to see all data.    Tobacco Social History   Tobacco Use  Smoking Status Never Smoker  Smokeless Tobacco Never Used     Counseling given: No   Clinical Intake:  Pre-visit preparation completed: Yes  Pain : No/denies pain Pain Score: 0-No pain     Nutritional Status: BMI 25 -29 Overweight Nutritional Risks: None Diabetes: No  How often do you need to have someone help you when you read  instructions, pamphlets, or other written materials from your doctor or pharmacy?: 1 - Never What is the last grade level you completed in school?: 12th grade + 3 yrs college  Interpreter Needed?: No  Comments: pt lives with spouse Information entered by :: LPinson, LPN  Past Medical History:  Diagnosis Date  . Allergic rhinitis    gets allergy shot from Dr. Carlis Abbott  . Anxiety   . Arthritis   . Diabetes mellitus without complication (HCC)    diet controlled  . GERD (gastroesophageal reflux disease)   . History of shingles    at age 43- mild  . Hx of colonic polyp   . Hyperglycemia   . Hyperlipemia    Very high chol intol of statins-does not want to check it  . Hypertension   . Panic disorder   . Tremor    from anx  . Urinary incontinence    mixed  . Voice disorder    Past Surgical History:  Procedure Laterality Date  . ABDOMINAL HYSTERECTOMY    . BLADDER SUSPENSION    . COLONOSCOPY WITH PROPOFOL N/A 12/24/2016   Procedure: COLONOSCOPY WITH PROPOFOL;  Surgeon: Manya Silvas, MD;  Location: [facility] ENDOSCOPY;  Service: Endoscopy;  Laterality: N/A;  . ESOPHAGOGASTRODUODENOSCOPY (EGD) WITH PROPOFOL N/A 12/24/2016   Procedure: ESOPHAGOGASTRODUODENOSCOPY (EGD) WITH PROPOFOL;  Surgeon: Manya Silvas, MD;  Location: [facility] ENDOSCOPY;  Service: Endoscopy;  Laterality: N/A;  . EYE SURGERY Bilateral 2000   eye lens replacement  . HAND  SURGERY Left 2016   left long trigger finger release  . JOINT REPLACEMENT     knee replacement - march 15  . KNEE ARTHROSCOPY Left 04/26/2016   Procedure: ARTHROSCOPY KNEE;  Surgeon: Hessie Knows, MD;  Location: [facility] ORS;  Service: Orthopedics;  Laterality: Left;  Marland Kitchen MEDIAL PARTIAL KNEE REPLACEMENT Left 2016  . NASAL SINUS SURGERY    . PARTIAL HYSTERECTOMY     with fibroid  . RECTOCELE REPAIR    . SYNOVECTOMY Left 04/26/2016   Procedure: PARTIAL SYNOVECTOMY;  Surgeon: Hessie Knows, MD;  Location: [facility] ORS;  Service: Orthopedics;  Laterality: Left;  .  TONSILLECTOMY     Family History  Problem Relation Age of Onset  . Diabetes Mother   . Hyperlipidemia Mother   . Hypertension Mother   . Osteoarthritis Mother   . Cancer Father        Colon  . Leukemia Sister   . Syncope episode Daughter    Social History   Socioeconomic History  . Marital status: Married    Spouse name: Not on file  . Number of children: 2  . Years of education: Not on file  . Highest education level: Not on file  Occupational History  . 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 Financial resource strain: Not on file  . Food insecurity:    Worry: Not on file    Inability: Not on file  . Transportation needs:    Medical: Not on file    Non-medical: Not on file  Tobacco Use  . Smoking status: Never Smoker  . Smokeless tobacco: Never Used  Substance and Sexual Activity  . Alcohol use: No    Alcohol/week: 0.0 oz  . Drug use: No  . Sexual activity: Never    Birth control/protection: None  Lifestyle  . Physical activity:    Days per week: Not on file    Minutes per session: Not on file  . Stress: Not on file  Relationships  . Social connections:    Talks on phone: Not on file    Gets together: Not on file    Attends religious service: Not on file    Active member of club or organization: Not on file    Attends meetings of clubs or organizations: Not on file    Relationship status: Not on file  Other Topics Concern  . Not on file  Social History Narrative  . Not on file    Outpatient Encounter Medications as of 03/28/2018  Medication Sig  . ALPRAZolam (XANAX) 0.5 MG tablet Take 1 tablet (0.5 mg total) by mouth at bedtime as needed for anxiety.  . Azelastine HCl (ASTEPRO) 0.15 % SOLN 2 sprays by Nasal route 2 (two) times daily as needed.  . Calcium Carbonate-Vitamin D (CALCIUM 600+D PO) Take 1 tablet by mouth 2 (two) times daily.   . cetirizine (ZYRTEC) 10 MG tablet Take 10 mg by mouth daily.    Marland Kitchen  EPINEPHrine 0.3 mg/0.3 mL IJ SOAJ injection Inject 0.3 mg into the skin once. as needed.  . Multiple Vitamin (MULTIVITAMIN) tablet Take 1 tablet by mouth daily.    . Omega-3 Fatty Acids (FISH OIL) 1200 MG CAPS Take 1 capsule by mouth daily.   Marland Kitchen omeprazole (PRILOSEC) 20 MG capsule TAKE 2 CAPSULES EVERY MORNING AND ONE EACH IN THE EVENING  . ramipril (ALTACE) 10 MG capsule TAKE 1 CAPSULE BY MOUTH EVERY DAY  . sertraline (ZOLOFT)  50 MG tablet Take 1.5 tablets (75 mg total) by mouth daily.  Marland Kitchen triamterene-hydrochlorothiazide (MAXZIDE-25) 37.5-25 MG tablet Take 0.5 tablets by mouth daily.   No facility-administered encounter medications on file as of 03/28/2018.     Activities of Daily Living In your present state of health, do you have any difficulty performing the following activities: 03/28/2018  Hearing? N  Vision? N  Difficulty concentrating or making decisions? N  Walking or climbing stairs? N  Dressing or bathing? N  Doing errands, shopping? N  Preparing Food and eating ? N  Using the Toilet? N  In the past six months, have you accidently leaked urine? N  Do you have problems with loss of bowel control? N  Managing your Medications? N  Managing your Finances? N  Housekeeping or managing your Housekeeping? N  Some recent data might be hidden    Patient Care Team: Tower, Wynelle Fanny, MD as PCP - General Vaught, Jeannie Fend, MD as Referring Physician (Otolaryngology) Hessie Knows, MD as Consulting Physician (Orthopedic Surgery) Rainey Pines, MD as Referring Physician (Psychiatry) Manya Silvas, MD as Consulting Physician (Gastroenterology) Eula Flax, OD as Referring Physician (Optometry)    Assessment:   This is a routine wellness examination for Adaira.   Hearing Screening   125Hz  250Hz  500Hz  1000Hz  2000Hz  3000Hz  4000Hz  6000Hz  8000Hz   Right ear:   40 40 40  40    Left ear:   40 40 40  40    Vision Screening Comments: Vision exam every 6 mths with Dr. Eula Flax    Exercise Activities and Dietary recommendations Current Exercise Habits: Home exercise routine, Type of exercise: Other - see comments(deep water aerobics), Time (Minutes): > 60(90 minutes), Frequency (Times/Week): 3, Weekly Exercise (Minutes/Week): 0, Intensity: Moderate, Exercise limited by: None identified  Goals    . Increase physical activity     Starting 03/28/2018, I will continue to exercise for 90 minutes 3 days per week.       Fall Risk Fall Risk  03/28/2018 03/07/2017 03/07/2016 01/23/2016 01/21/2015  Falls in the past year? No No Yes Yes Yes  Comment - - pt tripped and fell; denies injury - -  Number falls in past yr: - - 1 1 1   Injury with Fall? - - No No No  Follow up - - Falls evaluation completed - -   Depression Screen PHQ 2/9 Scores 03/28/2018 03/07/2017 03/07/2016 01/23/2016  PHQ - 2 Score 0 0 0 0  PHQ- 9 Score 0 - - -     Cognitive Function MMSE - Mini Mental State Exam 03/28/2018 03/07/2017 03/07/2016  Orientation to time 5 5 5   Orientation to Place 5 5 5   Registration 3 3 3   Attention/ Calculation 0 0 0  Recall 3 3 3   Language- name 2 objects 0 0 0  Language- repeat 1 1 1   Language- follow 3 step command 3 3 3   Language- read & follow direction 0 0 0  Write a sentence 0 0 0  Copy design 0 0 0  Total score 20 20 20      PLEASE NOTE: A Mini-Cog screen was completed. Maximum score is 20. A value of 0 denotes this part of [place] MMSE was not completed or the patient failed this part of the Mini-Cog screening.   Mini-Cog Screening Orientation to Time - Max 5 pts Orientation to Place - Max 5 pts Registration - Max 3 pts Recall - Max 3 pts Language Repeat - Max 1 pts  Language Follow 3 Step Command - Max 3 pts     Immunization History  Administered Date(s) Administered  . H1N1 10/04/2008  . Hepatitis B 01/16/1992, 02/16/1992, 08/17/1992  . Influenza Split 07/18/2011, 06/04/2012  . Influenza Whole 06/17/2008, 07/19/2009, 07/11/2010  . Influenza,  High Dose Seasonal PF 06/05/2017  . Influenza,inj,Quad PF,6+ Mos 06/02/2013, 06/13/2016  . Influenza,trivalent, recombinat, inj, PF 06/09/2015  . Influenza-Unspecified 06/10/2014  . Pneumococcal Polysaccharide-23 09/17/2002  . Td 10/04/2008  . Zoster 05/25/2009    Screening Tests Health Maintenance  Topic Date Due  . INFLUENZA VACCINE  04/17/2018  . MAMMOGRAM  06/25/2018  . TETANUS/TDAP  10/04/2018  . COLONOSCOPY  12/25/2026  . DEXA SCAN  Completed  . Hepatitis C Screening  Completed       Plan:     I have personally reviewed, addressed, and noted the following in the patient's chart:  A. Medical and social history B. Use of alcohol, tobacco or illicit drugs  C. Current medications and supplements D. Functional ability and status E.  Nutritional status F.  Physical activity G. Advance directives H. List of other physicians I.  Hospitalizations, surgeries, and ER visits in previous 12 months J.  [place] to include hearing, vision, cognitive, depression L. Referrals and appointments - none  In addition, I have reviewed and discussed with patient certain preventive protocols, quality metrics, and best practice recommendations. A written personalized care plan for preventive services as well as general preventive health recommendations were provided to patient.  See attached scanned questionnaire for additional information.   Signed,   Lindell Noe, MHA, BS, LPN Health Coach

## 2018-04-04 ENCOUNTER — Ambulatory Visit (INDEPENDENT_AMBULATORY_CARE_PROVIDER_SITE_OTHER): Payer: Medicare Other | Admitting: Family Medicine

## 2018-04-04 ENCOUNTER — Encounter: Payer: Self-pay | Admitting: Family Medicine

## 2018-04-04 VITALS — BP 120/72 | HR 81 | Ht 64.0 in | Wt 171.0 lb

## 2018-04-04 DIAGNOSIS — F320 Major depressive disorder, single episode, mild: Secondary | ICD-10-CM

## 2018-04-04 DIAGNOSIS — E663 Overweight: Secondary | ICD-10-CM

## 2018-04-04 DIAGNOSIS — R739 Hyperglycemia, unspecified: Secondary | ICD-10-CM

## 2018-04-04 DIAGNOSIS — I10 Essential (primary) hypertension: Secondary | ICD-10-CM

## 2018-04-04 DIAGNOSIS — K219 Gastro-esophageal reflux disease without esophagitis: Secondary | ICD-10-CM

## 2018-04-04 DIAGNOSIS — Z0000 Encounter for general adult medical examination without abnormal findings: Secondary | ICD-10-CM | POA: Diagnosis not present

## 2018-04-04 DIAGNOSIS — E7800 Pure hypercholesterolemia, unspecified: Secondary | ICD-10-CM

## 2018-04-04 DIAGNOSIS — R7989 Other specified abnormal findings of blood chemistry: Secondary | ICD-10-CM | POA: Diagnosis not present

## 2018-04-04 DIAGNOSIS — F32A Depression, unspecified: Secondary | ICD-10-CM

## 2018-04-04 DIAGNOSIS — M85859 Other specified disorders of bone density and structure, unspecified thigh: Secondary | ICD-10-CM | POA: Diagnosis not present

## 2018-04-04 DIAGNOSIS — E2839 Other primary ovarian failure: Secondary | ICD-10-CM

## 2018-04-04 MED ORDER — RAMIPRIL 10 MG PO CAPS: ORAL_CAPSULE | ORAL | 3 refills | Status: DC

## 2018-04-04 MED ORDER — TRIAMTERENE-HCTZ 37.5-25 MG PO TABS: 0.5000 | ORAL_TABLET | Freq: Every day | ORAL | 3 refills | Status: DC

## 2018-04-04 MED ORDER — OMEPRAZOLE 20 MG PO CPDR: DELAYED_RELEASE_CAPSULE | ORAL | 3 refills | Status: DC

## 2018-04-04 NOTE — Assessment & Plan Note (Signed)
Commended on 20 lb wt loss! Discussed how this problem influences overall health and the risks it imposes  Reviewed plan for weight loss with lower calorie diet (via better food choices and also portion control or program like weight watchers) and exercise building up to or more than 30 minutes 5 days per week including some aerobic activity   She plans to keep up the good work with diet and water exercise

## 2018-04-04 NOTE — Assessment & Plan Note (Signed)
Continues to improve with weight loss Lab Results  Component Value Date   HGBA1C 6.3 03/28/2018   disc imp of low glycemic diet and wt loss to prevent DM2

## 2018-04-04 NOTE — Assessment & Plan Note (Signed)
Lab Results  Component Value Date   TSH 5.66 (H) 03/28/2018   FT4 still ok  No symptoms  Continue to follow  May become hypothyroid in the future /pt aware

## 2018-04-04 NOTE — Assessment & Plan Note (Signed)
Continues psychiatric care Stable/doing well currently  Exercise also helps

## 2018-04-04 NOTE — Assessment & Plan Note (Signed)
Disc goals for lipids and reasons to control them Rev last labs with pt Rev low sat fat diet in detail Intol of statins  Cannot afford PCYK9 Has seen cardiology  LDL still over 200 despite good diet

## 2018-04-04 NOTE — Assessment & Plan Note (Signed)
Continues ppi refilled

## 2018-04-04 NOTE — Assessment & Plan Note (Signed)
Schedule dexa in October  No falls/fx On vit D/ca  Exercise is good

## 2018-04-04 NOTE — Assessment & Plan Note (Signed)
bp in fair control at this time  BP Readings from Last 1 Encounters:  04/04/18 120/72   No changes needed Most recent labs reviewed  Disc lifstyle change with low sodium diet and exercise  Enc further wt loss /doing well

## 2018-04-04 NOTE — Progress Notes (Signed)
Subjective:    Patient ID: [name], female    DOB: Oct 09, 1945, 72 y.o.   MRN: [id number]  HPI Here for health maintenance exam and to review chronic medical problems    Doing well overall  Oct is her 50th wedding Maryruth Hancock  Having a good summer   Wt Readings from Last 3 Encounters:  04/04/18 171 lb (77.6 kg)  03/28/18 169 lb 8 oz (76.9 kg)  12/13/17 169 lb 8 oz (76.9 kg)  has lost wt She had lost 22 lb - pool closed this week / gained 2 lb back  Will get back to it  Eats healthy most of the time  29.35 kg/m    amw was 7/12 No gaps/concerns  Mammogram 10/18 - nl  Self breast exam - no lumps or changes    Colonoscopy 4/18 -polyp hyperplastic  Recall 5 y  Father had colon cancer   H/o severe local rxn to [facility] 23 so declines prevnar   dexa 5/17 Mild osteopenia at hip  No falls  No fractures  Wants to do dexa in October  Water exercise/uses weights on legs   (regular)  She takes vitamin D    zostavax 9/10   bp is stable today  No cp or palpitations or headaches or edema  No side effects to medicines  BP Readings from Last 3 Encounters:  04/04/18 120/72  03/28/18 102/78  12/13/17 92/62      Depression/anx Psychiatric care Mood has been very good lately    Hyperlipidemia Lab Results  Component Value Date   CHOL 309 (H) 03/28/2018   CHOL 326 (H) 03/07/2017   CHOL 343 (H) 01/19/2016   Lab Results  Component Value Date   HDL 48.50 03/28/2018   HDL 47.40 03/07/2017   HDL 45.40 01/19/2016   Lab Results  Component Value Date   LDLCALC 242 (H) 03/28/2018   LDLCALC 257 (H) 03/07/2017   LDLCALC 275 (H) 01/19/2016   Lab Results  Component Value Date   TRIG 91.0 03/28/2018   TRIG 108.0 03/07/2017   TRIG 114.0 01/19/2016   Lab Results  Component Value Date   CHOLHDL 6 03/28/2018   CHOLHDL 7 03/07/2017   CHOLHDL 8 01/19/2016   Lab Results  Component Value Date   LDLDIRECT 242.8 01/15/2012   LDLDIRECT 237.0 12/18/2010   LDLDIRECT 227.8  09/20/2009  found out PCYK9 inhibitor would not be covered-not affordable  Intol fo statins   Hyperglycemia Lab Results  Component Value Date   HGBA1C 6.3 03/28/2018  down from prev  Keeps coming down   Lab Results  Component Value Date   TSH 5.66 (H) 03/28/2018   normal FT4 at 0.89 Will keep watching it   Lab Results  Component Value Date   CREATININE 0.99 03/28/2018   BUN 27 (H) 03/28/2018   NA 137 03/28/2018   K 4.3 03/28/2018   CL 100 03/28/2018   CO2 28 03/28/2018   Lab Results  Component Value Date   ALT 17 03/28/2018   AST 19 03/28/2018   ALKPHOS 73 03/28/2018   BILITOT 0.7 03/28/2018   Lab Results  Component Value Date   WBC 7.3 03/28/2018   HGB 14.9 03/28/2018   HCT 44.2 03/28/2018   MCV 86.1 03/28/2018   PLT 262.0 03/28/2018      Patient Active Problem List   Diagnosis Date Noted  . Osteopenia 02/13/2016  . Routine general medical examination at a health care facility 01/23/2016  . Estrogen deficiency  01/23/2016  . Abnormal TSH 01/23/2016  . Mild depression ([place]) 02/21/2015  . H/O diabetes mellitus 01/04/2015  . H/O arthritis 01/04/2015  . History of hay fever 01/04/2015  . Encounter for Medicare annual wellness exam 01/14/2013  . Overweight (BMI 25.0-29.9) 01/15/2012  . MENOPAUSAL SYNDROME 10/04/2008  . Hyperglycemia 12/02/2007  . TREMOR 12/02/2007  . DEPRESSION 08/04/2007  . Hyperlipidemia 01/28/2007  . ANXIETY 01/28/2007  . Essential hypertension 01/28/2007  . GERD 01/28/2007  . URINARY INCONTINENCE 01/28/2007  . COLONIC POLYPS, HX OF 01/28/2007  . HERPES ZOSTER 01/20/2007  . DYSPHONIA 01/20/2007  . COUGH, CHRONIC 01/20/2007  . ALLERGY 01/20/2007   Past Medical History:  Diagnosis Date  . Allergic rhinitis    gets allergy shot from Dr. Carlis Abbott  . Anxiety   . Arthritis   . Diabetes mellitus without complication (HCC)    diet controlled  . GERD (gastroesophageal reflux disease)   . History of shingles    at age 44- mild  . Hx  of colonic polyp   . Hyperglycemia   . Hyperlipemia    Very high chol intol of statins-does not want to check it  . Hypertension   . Panic disorder   . Tremor    from anx  . Urinary incontinence    mixed  . Voice disorder    Past Surgical History:  Procedure Laterality Date  . ABDOMINAL HYSTERECTOMY    . BLADDER SUSPENSION    . COLONOSCOPY WITH PROPOFOL N/A 12/24/2016   Procedure: COLONOSCOPY WITH PROPOFOL;  Surgeon: Manya Silvas, MD;  Location: [facility] ENDOSCOPY;  Service: Endoscopy;  Laterality: N/A;  . ESOPHAGOGASTRODUODENOSCOPY (EGD) WITH PROPOFOL N/A 12/24/2016   Procedure: ESOPHAGOGASTRODUODENOSCOPY (EGD) WITH PROPOFOL;  Surgeon: Manya Silvas, MD;  Location: [facility] ENDOSCOPY;  Service: Endoscopy;  Laterality: N/A;  . EYE SURGERY Bilateral 2000   eye lens replacement  . HAND SURGERY Left 2016   left long trigger finger release  . JOINT REPLACEMENT     knee replacement - march 15  . KNEE ARTHROSCOPY Left 04/26/2016   Procedure: ARTHROSCOPY KNEE;  Surgeon: Hessie Knows, MD;  Location: [facility] ORS;  Service: Orthopedics;  Laterality: Left;  Marland Kitchen MEDIAL PARTIAL KNEE REPLACEMENT Left 2016  . NASAL SINUS SURGERY    . PARTIAL HYSTERECTOMY     with fibroid  . RECTOCELE REPAIR    . SYNOVECTOMY Left 04/26/2016   Procedure: PARTIAL SYNOVECTOMY;  Surgeon: Hessie Knows, MD;  Location: [facility] ORS;  Service: Orthopedics;  Laterality: Left;  . TONSILLECTOMY     Social History   Tobacco Use  . Smoking status: Never Smoker  . Smokeless tobacco: Never Used  Substance Use Topics  . Alcohol use: No    Alcohol/week: 0.0 oz  . Drug use: No   Family History  Problem Relation Age of Onset  . Diabetes Mother   . Hyperlipidemia Mother   . Hypertension Mother   . Osteoarthritis Mother   . Cancer Father        Colon  . Leukemia Sister   . Syncope episode Daughter    Allergies  Allergen Reactions  . Atorvastatin     REACTION: shoulder pain  . Ezetimibe     REACTION: ?  . Lisinopril      REACTION: muscle pain  . Molds & Smuts   . Penicillins     REACTION: reaction not known Has patient had a PCN reaction causing immediate rash, facial/tongue/throat swelling, SOB or lightheadedness with hypotension: unknown Has patient had a  PCN reaction causing severe rash involving mucus membranes or skin necrosis: unknown Has patient had a PCN reaction that required hospitalization unsure Has patient had a PCN reaction occurring within the last 10 years: no If all of the above answers are "NO", then may proceed with Cephalosporin use.  . Pneumococcal Vaccine Swelling    REACTION: severe local reaction  . Pneumococcal Vaccine Polyvalent     REACTION: severe local reaction  . Pollen Extract   . Rosuvastatin     REACTION: leg pain  . Simvastatin     REACTION: muscle pain   Current Outpatient Medications on File Prior to Visit  Medication Sig Dispense Refill  . ALPRAZolam (XANAX) 0.5 MG tablet Take 1 tablet (0.5 mg total) by mouth at bedtime as needed for anxiety. 30 tablet 5  . Azelastine HCl (ASTEPRO) 0.15 % SOLN 2 sprays by Nasal route 2 (two) times daily as needed. 30 mL 11  . Calcium Carbonate-Vitamin D (CALCIUM 600+D PO) Take 1 tablet by mouth 2 (two) times daily.     . cetirizine (ZYRTEC) 10 MG tablet Take 10 mg by mouth daily.      Marland Kitchen EPINEPHrine 0.3 mg/0.3 mL IJ SOAJ injection Inject 0.3 mg into the skin once. as needed.    . Multiple Vitamin (MULTIVITAMIN) tablet Take 1 tablet by mouth daily.      . Omega-3 Fatty Acids (FISH OIL) 1200 MG CAPS Take 1 capsule by mouth daily.     . sertraline (ZOLOFT) 50 MG tablet Take 1.5 tablets (75 mg total) by mouth daily. 135 tablet 1   No current facility-administered medications on file prior to visit.      Review of Systems  Constitutional: Negative for activity change, appetite change, fatigue, fever and unexpected weight change.  HENT: Negative for congestion, ear pain, rhinorrhea, sinus pressure and sore throat.   Eyes: Negative for  pain, redness and visual disturbance.  Respiratory: Negative for cough, shortness of breath and wheezing.   Cardiovascular: Negative for chest pain and palpitations.  Gastrointestinal: Negative for abdominal pain, blood in stool, constipation and diarrhea.  Endocrine: Negative for polydipsia and polyuria.  Genitourinary: Negative for dysuria, frequency and urgency.  Musculoskeletal: Negative for arthralgias, back pain and myalgias.  Skin: Negative for pallor and rash.  Allergic/Immunologic: Negative for environmental allergies.  Neurological: Negative for dizziness, syncope and headaches.  Hematological: Negative for adenopathy. Does not bruise/bleed easily.  Psychiatric/Behavioral: Negative for decreased concentration and dysphoric mood. The patient is not nervous/anxious.        Objective:   Physical Exam  Constitutional: She appears well-developed and well-nourished. No distress.  overwt and well app Wt loss noted   HENT:  Head: Normocephalic and atraumatic.  Right Ear: External ear normal.  Left Ear: External ear normal.  Mouth/Throat: Oropharynx is clear and moist.  Baseline vocal strain  Eyes: Pupils are equal, round, and reactive to light. Conjunctivae and EOM are normal. Right eye exhibits no discharge. Left eye exhibits no discharge. No scleral icterus.  Neck: Normal range of motion. Neck supple. No JVD present. Carotid bruit is not present. No thyromegaly present.  Cardiovascular: Normal rate, regular rhythm, normal heart sounds and intact distal pulses. Exam reveals no gallop.  Pulmonary/Chest: Effort normal and breath sounds normal. No respiratory distress. She has no wheezes. She exhibits no tenderness. No breast tenderness, discharge or bleeding.  Abdominal: Soft. Bowel sounds are normal. She exhibits no distension, no abdominal bruit and no mass. There is no tenderness.  Genitourinary:  Genitourinary Comments: Breast exam: No mass, nodules, thickening, tenderness,  bulging, retraction, inflamation, nipple discharge or skin changes noted.  No axillary or clavicular LA.      Musculoskeletal: Normal range of motion. She exhibits no edema, tenderness or deformity.  Lymphadenopathy:    She has no cervical adenopathy.  Neurological: She is alert. She has normal reflexes. She displays normal reflexes. No cranial nerve deficit. She exhibits normal muscle tone. Coordination normal.  Mild tremor unchanged   Skin: Skin is warm and dry. No rash noted. No erythema. No pallor.  No rash Solar lentigines diffusely  Few scattered SKs  Psychiatric: She has a normal mood and affect.  Mood is good           Assessment & Plan:   Problem List Items Addressed This Visit      Cardiovascular and Mediastinum   Essential hypertension    bp in fair control at this time  BP Readings from Last 1 Encounters:  04/04/18 120/72   No changes needed Most recent labs reviewed  Disc lifstyle change with low sodium diet and exercise  Enc further wt loss /doing well       Relevant Medications   ramipril (ALTACE) 10 MG capsule   triamterene-hydrochlorothiazide (MAXZIDE-25) 37.5-25 MG tablet     Digestive   GERD    Continues ppi refilled      Relevant Medications   omeprazole (PRILOSEC) 20 MG capsule     Musculoskeletal and Integument   Osteopenia    Schedule dexa in October  No falls/fx On vit D/ca  Exercise is good          Other   Abnormal TSH    Lab Results  Component Value Date   TSH 5.66 (H) 03/28/2018   FT4 still ok  No symptoms  Continue to follow  May become hypothyroid in the future /pt aware       Estrogen deficiency   Relevant Orders   DG Bone Density   Hyperglycemia    Continues to improve with weight loss Lab Results  Component Value Date   HGBA1C 6.3 03/28/2018   disc imp of low glycemic diet and wt loss to prevent DM2       Hyperlipidemia    Disc goals for lipids and reasons to control them Rev last labs with pt Rev low  sat fat diet in detail Intol of statins  Cannot afford PCYK9 Has seen cardiology  LDL still over 200 despite good diet       Relevant Medications   ramipril (ALTACE) 10 MG capsule   triamterene-hydrochlorothiazide (MAXZIDE-25) 37.5-25 MG tablet   Mild depression ()    Continues psychiatric care Stable/doing well currently  Exercise also helps      Overweight (BMI 25.0-29.9)    Commended on 20 lb wt loss! Discussed how this problem influences overall health and the risks it imposes  Reviewed plan for weight loss with lower calorie diet (via better food choices and also portion control or program like weight watchers) and exercise building up to or more than 30 minutes 5 days per week including some aerobic activity   She plans to keep up the good work with diet and water exercise       Routine general medical examination at a health care facility - Primary    Reviewed health habits including diet and exercise and skin cancer prevention Reviewed appropriate screening tests for age  Also reviewed health mt list, fam hx and immunization  status , as well as social and family history   See HPI Labs reviewed  dexa ordered  Commended great lifestyle change and wt loss  Unable to tx cholesterol unfortunately

## 2018-04-04 NOTE — Patient Instructions (Addendum)
Great job with weight loss !  Keep up the great diet and exercise  Other than cholesterol- labs are ok  We will watching thyroid   No change in medicines   Stop at check out for bone density referral

## 2018-04-04 NOTE — Assessment & Plan Note (Signed)
Reviewed health habits including diet and exercise and skin cancer prevention Reviewed appropriate screening tests for age  Also reviewed health mt list, fam hx and immunization status , as well as social and family history   See HPI Labs reviewed  dexa ordered  Commended great lifestyle change and wt loss  Unable to tx cholesterol unfortunately

## 2018-04-23 ENCOUNTER — Other Ambulatory Visit: Payer: Self-pay | Admitting: Family Medicine

## 2018-04-23 DIAGNOSIS — Z1231 Encounter for screening mammogram for malignant neoplasm of breast: Secondary | ICD-10-CM

## 2018-05-06 LAB — HM DIABETES EYE EXAM

## 2018-05-08 ENCOUNTER — Encounter: Payer: Self-pay | Admitting: Family Medicine

## 2018-06-26 ENCOUNTER — Ambulatory Visit
Admission: RE | Admit: 2018-06-26 | Discharge: 2018-06-26 | Disposition: A | Discharge status: Home / Self Care | Payer: Medicare Other | Source: Ambulatory Visit | Attending: Family Medicine | Admitting: Family Medicine

## 2018-06-26 DIAGNOSIS — E2839 Other primary ovarian failure: Secondary | ICD-10-CM | POA: Insufficient documentation

## 2018-06-26 DIAGNOSIS — Z1231 Encounter for screening mammogram for malignant neoplasm of breast: Secondary | ICD-10-CM | POA: Insufficient documentation

## 2018-07-14 ENCOUNTER — Encounter: Payer: Self-pay | Admitting: Psychiatry

## 2018-07-14 ENCOUNTER — Other Ambulatory Visit: Payer: Self-pay

## 2018-07-14 ENCOUNTER — Ambulatory Visit: Payer: Medicare Other | Admitting: Psychiatry

## 2018-07-14 VITALS — BP 134/84 | HR 77 | Temp 98.2°F | Wt 172.4 lb

## 2018-07-14 DIAGNOSIS — F331 Major depressive disorder, recurrent, moderate: Secondary | ICD-10-CM | POA: Diagnosis not present

## 2018-07-14 DIAGNOSIS — F411 Generalized anxiety disorder: Secondary | ICD-10-CM

## 2018-07-14 MED ORDER — SERTRALINE HCL 50 MG PO TABS: 75.0000 mg | ORAL_TABLET | Freq: Every day | ORAL | 1 refills | Status: DC

## 2018-07-14 MED ORDER — ALPRAZOLAM 0.25 MG PO TABS: 0.2500 mg | ORAL_TABLET | Freq: Every evening | ORAL | 1 refills | Status: DC | PRN

## 2018-07-14 NOTE — Progress Notes (Signed)
[facility] MD/PA/NP OP Progress Note  07/14/2018 11:40 AM [name]  MRN:  [id number]  Subjective:  Patient is a 72 year-old female who presented for the follow-up appointment.  She was last seen 6 months ago.  She reported that she has been doing well and has been getting ready for the holidays.  Patient reported that her family members join her for the holidays.  Her husband helps her food.  She is excited about the same.  Patient reported that she does not have any acute issues at this time.  She takes Xanax at night to help her sleep.  She has been taking Zoloft as well.  Patient reported that the medications are helping her.  Patient reported that she is planning to start preparing for the holidays.  She does want therapy for her back pain but her back is getting better.  She does not have any acute back problems at this time.  She appeared calm and alert during the interview.  She denied having any suicidal homicidal ideations or plans.  She denied having any perceptual disturbances.     Chief Complaint:  Chief Complaint    Follow-up; Medication Refill     Visit Diagnosis:     ICD-10-CM   1. MDD (major depressive disorder), recurrent episode, moderate (HCC) F33.1   2. Generalized anxiety disorder F41.1     Past Medical History:  Past Medical History:  Diagnosis Date  . Allergic rhinitis    gets allergy shot from Dr. Carlis Abbott  . Anxiety   . Arthritis   . Diabetes mellitus without complication (HCC)    diet controlled  . GERD (gastroesophageal reflux disease)   . History of shingles    at age 43- mild  . Hx of colonic polyp   . Hyperglycemia   . Hyperlipemia    Very high chol intol of statins-does not want to check it  . Hypertension   . Panic disorder   . Tremor    from anx  . Urinary incontinence    mixed  . Voice disorder     Past Surgical History:  Procedure Laterality Date  . ABDOMINAL HYSTERECTOMY    . BLADDER SUSPENSION    . COLONOSCOPY WITH PROPOFOL N/A 12/24/2016   Procedure: COLONOSCOPY WITH PROPOFOL;  Surgeon: Manya Silvas, MD;  Location: [facility] ENDOSCOPY;  Service: Endoscopy;  Laterality: N/A;  . ESOPHAGOGASTRODUODENOSCOPY (EGD) WITH PROPOFOL N/A 12/24/2016   Procedure: ESOPHAGOGASTRODUODENOSCOPY (EGD) WITH PROPOFOL;  Surgeon: Manya Silvas, MD;  Location: [facility] ENDOSCOPY;  Service: Endoscopy;  Laterality: N/A;  . EYE SURGERY Bilateral 2000   eye lens replacement  . HAND SURGERY Left 2016   left long trigger finger release  . JOINT REPLACEMENT     knee replacement - march 15  . KNEE ARTHROSCOPY Left 04/26/2016   Procedure: ARTHROSCOPY KNEE;  Surgeon: Hessie Knows, MD;  Location: [facility] ORS;  Service: Orthopedics;  Laterality: Left;  Marland Kitchen MEDIAL PARTIAL KNEE REPLACEMENT Left 2016  . NASAL SINUS SURGERY    . PARTIAL HYSTERECTOMY     with fibroid  . RECTOCELE REPAIR    . SYNOVECTOMY Left 04/26/2016   Procedure: PARTIAL SYNOVECTOMY;  Surgeon: Hessie Knows, MD;  Location: [facility] ORS;  Service: Orthopedics;  Laterality: Left;  . TONSILLECTOMY     Family History:  Family History  Problem Relation Age of Onset  . Diabetes Mother   . Hyperlipidemia Mother   . Hypertension Mother   . Osteoarthritis Mother   . Cancer Father  Colon  . Leukemia Sister   . Syncope episode Daughter    Social History:  Social History   Socioeconomic History  . Marital status: Married    Spouse name: Not on file  . Number of children: 2  . Years of education: Not on file  . Highest education level: Not on file  Occupational History  . 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 Financial resource strain: Not on file  . Food insecurity:    Worry: Not on file    Inability: Not on file  . Transportation needs:    Medical: Not on file    Non-medical: Not on file  Tobacco Use  . Smoking status: Never Smoker  . Smokeless tobacco: Never Used  Substance and Sexual Activity  . Alcohol use: No    Alcohol/week: 0.0 standard  drinks  . Drug use: No  . Sexual activity: Never    Birth control/protection: None  Lifestyle  . Physical activity:    Days per week: Not on file    Minutes per session: Not on file  . Stress: Not on file  Relationships  . Social connections:    Talks on phone: Not on file    Gets together: Not on file    Attends religious service: Not on file    Active member of club or organization: Not on file    Attends meetings of clubs or organizations: Not on file    Relationship status: Not on file  Other Topics Concern  . Not on file  Social History Narrative  . Not on file   Additional History:  Lives with husband. She stated that she spends time by exercise and doing hosehold chores. She stated that she is having issues due to knee replacement now.   Assessment:   Musculoskeletal: Strength & Muscle Tone: within normal limits Gait & Station: normal Patient leans: N/A  Psychiatric Specialty Exam: Medication Refill  Associated symptoms include myalgias. Pertinent negatives include no vomiting.    Review of Systems  Constitutional: Negative for weight loss.  HENT: Negative for hearing loss.   Eyes: Negative for photophobia.  Respiratory: Negative for hemoptysis.   Cardiovascular: Negative for palpitations.  Gastrointestinal: Negative for vomiting.  Genitourinary: Negative for urgency.  Musculoskeletal: Positive for back pain, joint pain and myalgias.  Neurological: Negative for tremors.  Endo/Heme/Allergies: Negative for environmental allergies.  Psychiatric/Behavioral: Negative for depression and hallucinations. The patient has insomnia. The patient is not nervous/anxious.   All other systems reviewed and are negative.   Blood pressure 134/84, pulse 77, temperature 98.2 F (36.8 C), temperature source Oral, weight 172 lb 6.4 oz (78.2 kg), last menstrual period 09/18/1987.Body mass index is 29.59 kg/m.  General Appearance: Casual  Eye Contact:  Fair  Speech:  Clear and  Coherent  Volume:  Normal  Mood:  Anxious  Affect:  Appropriate  Thought Process:  Logical  Orientation:  Full (Time, Place, and Person)  Thought Content:  WDL  Suicidal Thoughts:  No  Homicidal Thoughts:  No  Memory:  Immediate;   Good  Judgement:  Fair  Insight:  Fair  Psychomotor Activity:  Normal  Concentration:  Fair  Recall:  [employer] of Knowledge: Fair  Language: Fair  Akathisia:  No  Handed:  Right  AIMS (if indicated):  none  Assets:  Communication Skills  ADL's:  Intact  Cognition: WNL  Sleep:  5-6    Is the patient  at risk to self?  No. Has the patient been a risk to self in the past 6 months?  No. Has the patient been a risk to self within the distant past?  No. Is the patient a risk to others?  No. Has the patient been a risk to others in the past 6 months?  No.   Has the patient been a risk to others within the distant past?  No.  Current Medications: Current Outpatient Medications  Medication Sig Dispense Refill  . ALPRAZolam (XANAX) 0.5 MG tablet Take 1 tablet (0.5 mg total) by mouth at bedtime as needed for anxiety. 30 tablet 5  . Azelastine HCl (ASTEPRO) 0.15 % SOLN 2 sprays by Nasal route 2 (two) times daily as needed. 30 mL 11  . Calcium Carbonate-Vitamin D (CALCIUM 600+D PO) Take 1 tablet by mouth 2 (two) times daily.     . cetirizine (ZYRTEC) 10 MG tablet Take 10 mg by mouth daily.      Marland Kitchen EPINEPHrine 0.3 mg/0.3 mL IJ SOAJ injection Inject 0.3 mg into the skin once. as needed.    . Multiple Vitamin (MULTIVITAMIN) tablet Take 1 tablet by mouth daily.      . Omega-3 Fatty Acids (FISH OIL) 1200 MG CAPS Take 1 capsule by mouth daily.     Marland Kitchen omeprazole (PRILOSEC) 20 MG capsule TAKE 2 CAPSULES EVERY MORNING AND ONE EACH IN THE EVENING 270 capsule 3  . ramipril (ALTACE) 10 MG capsule TAKE 1 CAPSULE BY MOUTH EVERY DAY 90 capsule 3  . sertraline (ZOLOFT) 50 MG tablet Take 1.5 tablets (75 mg total) by mouth daily. 135 tablet 1  .  triamterene-hydrochlorothiazide (MAXZIDE-25) 37.5-25 MG tablet Take 0.5 tablets by mouth daily. 45 tablet 3   No current facility-administered medications for this visit.     Medical Decision Making:  Established Problem, Stable/Improving (1)  Treatment Plan Summary:Medication management   Discussed with patient about her medications and   Discussed about decreasing the dose of Xanax due to her age and increased risk of falls and dementia.  She agreed with the plan.   Decrease Xanax 0.25 mg p.o. nightly.  She was given 6 months prescription of the same. Zoloft 75 mg by mouth daily     Patient will be given six-month supply of the medications.    Follow-up in 6  months    More than 50% of the time spent in psychoeducation, counseling and coordination of care.    This note was generated in part or whole with voice recognition software. Voice regonition is usually quite accurate but there are transcription errors that can and very often do occur. I apologize for any typographical errors that were not detected and corrected.   Rainey Pines, MD  07/14/2018, 11:40 AM

## 2018-10-07 ENCOUNTER — Other Ambulatory Visit: Payer: Self-pay | Admitting: Psychiatry

## 2019-01-12 ENCOUNTER — Other Ambulatory Visit: Payer: Self-pay

## 2019-01-12 ENCOUNTER — Ambulatory Visit (INDEPENDENT_AMBULATORY_CARE_PROVIDER_SITE_OTHER): Payer: Medicare Other | Admitting: Psychiatry

## 2019-01-12 ENCOUNTER — Encounter: Payer: Self-pay | Admitting: Psychiatry

## 2019-01-12 DIAGNOSIS — F411 Generalized anxiety disorder: Secondary | ICD-10-CM | POA: Diagnosis not present

## 2019-01-12 DIAGNOSIS — F331 Major depressive disorder, recurrent, moderate: Secondary | ICD-10-CM | POA: Diagnosis not present

## 2019-01-12 MED ORDER — SERTRALINE HCL 50 MG PO TABS: 75.0000 mg | ORAL_TABLET | Freq: Every day | ORAL | 1 refills | Status: DC

## 2019-01-12 MED ORDER — ALPRAZOLAM 0.25 MG PO TABS: 0.2500 mg | ORAL_TABLET | Freq: Every evening | ORAL | 1 refills | Status: DC | PRN

## 2019-01-12 NOTE — Progress Notes (Signed)
Patient ID: [name], female   DOB: Jul 04, 1946, 73 y.o.   MRN: [id number]  Patient is a 73 year old female with history of depression and anxiety who was followed up for her medications. She reported that she is currently experiencing problems with her sleep since the dose of alprazolam was decreased to 0.25 mg. She stated that she has taken the samples of the medication which was given to her for insomnia. However we have never given her any samples of the medications. She reported that she has tried several sleep medications including Tylenol p.m. but they all keep her wide-awake. Patient reported that she thinks that Xanax is the only medication which can help her sleep. She stated that she is staying at home and trying to keep herself safe at this time. She currently denied having any suicidal or homicidal ideations or plans.   Plan. Continue her medications including Zoloft 75 mg daily. Xanax 0.25 mg at bedtime. called prescription to the pharmacy with 90 days supply and 1 refill. Advise patient to add melatonin 5 to 10 mg at bedtime to help with insomnia. Follow up in six months.  I have discussed the assessment and treatment plan with the patient. The patient was provided an opportunity to ask questions and all were answered. The patient agreed with the plan and demonstrated an understanding of the instructions.   The patient was advised to call back or seek an in-person evaluation if the symptoms worsen or if the condition fails to improve as anticipated.   I provided 10 minutes of non-face-to-face time during this encounter.

## 2019-04-02 ENCOUNTER — Telehealth: Payer: Self-pay | Admitting: Family Medicine

## 2019-04-02 DIAGNOSIS — I10 Essential (primary) hypertension: Secondary | ICD-10-CM

## 2019-04-02 DIAGNOSIS — R7989 Other specified abnormal findings of blood chemistry: Secondary | ICD-10-CM

## 2019-04-02 DIAGNOSIS — Z8639 Personal history of other endocrine, nutritional and metabolic disease: Secondary | ICD-10-CM

## 2019-04-02 DIAGNOSIS — R7303 Prediabetes: Secondary | ICD-10-CM

## 2019-04-02 NOTE — Telephone Encounter (Signed)
-----   Message from Cloyd Stagers, RT sent at 04/02/2019 10:42 AM EDT ----- Regarding: Lab Orders for Friday 7.16.2020 Please place lab orders for Friday 7.16.2020, office visit for physical on Tuesday 7.28.2020 Thank you, Dyke Maes RT(R)

## 2019-04-03 ENCOUNTER — Ambulatory Visit (INDEPENDENT_AMBULATORY_CARE_PROVIDER_SITE_OTHER): Payer: Medicare Other

## 2019-04-03 ENCOUNTER — Other Ambulatory Visit (INDEPENDENT_AMBULATORY_CARE_PROVIDER_SITE_OTHER): Payer: Medicare Other

## 2019-04-03 VITALS — Ht 64.5 in | Wt 170.0 lb

## 2019-04-03 DIAGNOSIS — R7303 Prediabetes: Secondary | ICD-10-CM

## 2019-04-03 DIAGNOSIS — I10 Essential (primary) hypertension: Secondary | ICD-10-CM | POA: Diagnosis not present

## 2019-04-03 DIAGNOSIS — Z0000 Encounter for general adult medical examination without abnormal findings: Secondary | ICD-10-CM | POA: Diagnosis not present

## 2019-04-03 DIAGNOSIS — R7989 Other specified abnormal findings of blood chemistry: Secondary | ICD-10-CM

## 2019-04-03 LAB — CBC WITH DIFFERENTIAL/PLATELET
Basophils Absolute: 0.1 10*3/uL (ref 0.0–0.1)
Basophils Relative: 0.7 % (ref 0.0–3.0)
Eosinophils Absolute: 0.2 10*3/uL (ref 0.0–0.7)
Eosinophils Relative: 2.5 % (ref 0.0–5.0)
HCT: 45.1 % (ref 36.0–46.0)
Hemoglobin: 15.3 g/dL — ABNORMAL HIGH (ref 12.0–15.0)
Lymphocytes Relative: 30.2 % (ref 12.0–46.0)
Lymphs Abs: 2.5 10*3/uL (ref 0.7–4.0)
MCHC: 33.9 g/dL (ref 30.0–36.0)
MCV: 88.2 fl (ref 78.0–100.0)
Monocytes Absolute: 0.8 10*3/uL (ref 0.1–1.0)
Monocytes Relative: 10.2 % (ref 3.0–12.0)
Neutro Abs: 4.7 10*3/uL (ref 1.4–7.7)
Neutrophils Relative %: 56.4 % (ref 43.0–77.0)
Platelets: 254 10*3/uL (ref 150.0–400.0)
RBC: 5.12 Mil/uL — ABNORMAL HIGH (ref 3.87–5.11)
RDW: 13.3 % (ref 11.5–15.5)
WBC: 8.3 10*3/uL (ref 4.0–10.5)

## 2019-04-03 LAB — COMPREHENSIVE METABOLIC PANEL
ALT: 18 U/L (ref 0–35)
AST: 18 U/L (ref 0–37)
Albumin: 4.4 g/dL (ref 3.5–5.2)
Alkaline Phosphatase: 72 U/L (ref 39–117)
BUN: 25 mg/dL — ABNORMAL HIGH (ref 6–23)
CO2: 28 mEq/L (ref 19–32)
Calcium: 9.5 mg/dL (ref 8.4–10.5)
Chloride: 101 mEq/L (ref 96–112)
Creatinine, Ser: 1.03 mg/dL (ref 0.40–1.20)
GFR: 52.54 mL/min — ABNORMAL LOW (ref >60.00)
Glucose, Bld: 113 mg/dL — ABNORMAL HIGH (ref 70–99)
Potassium: 4.3 mEq/L (ref 3.5–5.1)
Sodium: 139 mEq/L (ref 135–145)
Total Bilirubin: 0.8 mg/dL (ref 0.2–1.2)
Total Protein: 6.4 g/dL (ref 6.0–8.3)

## 2019-04-03 LAB — LIPID PANEL
Cholesterol: 355 mg/dL — ABNORMAL HIGH (ref 0–200)
HDL: 44.2 mg/dL (ref >39.00)
LDL Cholesterol: 287 mg/dL — ABNORMAL HIGH (ref 0–99)
NonHDL: 310.3
Total CHOL/HDL Ratio: 8
Triglycerides: 118 mg/dL (ref 0.0–149.0)
VLDL: 23.6 mg/dL (ref 0.0–40.0)

## 2019-04-03 LAB — HEMOGLOBIN A1C: Hgb A1c MFr Bld: 6.2 % (ref 4.6–6.5)

## 2019-04-03 LAB — T4, FREE: Free T4: 0.74 ng/dL (ref 0.60–1.60)

## 2019-04-03 LAB — TSH: TSH: 7.39 u[IU]/mL — ABNORMAL HIGH (ref 0.35–4.50)

## 2019-04-03 NOTE — Progress Notes (Addendum)
Subjective:   [name] is a 73 y.o. female who presents for Medicare Annual (Subsequent) preventive examination.  This visit type was conducted due to national recommendations for restrictions regarding the COVID-19 Pandemic (e.g. social distancing). This format is felt to be most appropriate for this patient at this time. All issues noted in this document were discussed and addressed. No physical exam was performed (except for noted visual exam findings with Video Visits). This patient, [name], has given permission to perform this visit via telephone. Vital signs may be absent or patient reported.  Patient location:  At home  Nurse location:  At home     Review of Systems:  n/a Cardiac Risk Factors include: advanced age (>9men, >44 women);dyslipidemia;hypertension     Objective:     Vitals: Ht 5' 4.5" (1.638 m) Comment: per patient  Wt 170 lb (77.1 kg) Comment: per patient  LMP 09/18/1987   BMI 28.73 kg/m   Body mass index is 28.73 kg/m.  Advanced Directives 04/03/2019 03/28/2018 04/02/2017 03/07/2017 12/24/2016 04/26/2016 04/10/2016  Does Patient Have a Medical Advance Directive? Yes Yes Yes Yes No Yes Yes  Type of Paramedic of [place];Living will [place];Living will [place];Living will [place];Living will - [place];Living will [place];Living will  Does patient want to make changes to medical advance directive? No - Patient declined - - - - No - Patient declined No - Patient declined  Copy of [place] in Chart? No - copy requested No - copy requested Yes No - copy requested - No - copy requested No - copy requested  Would patient like information on creating a medical advance directive? - - - - - - -  Some encounter information is confidential and restricted. Go to Review Flowsheets activity to see all data.    Tobacco  Social History   Tobacco Use  Smoking Status Never Smoker  Smokeless Tobacco Never Used     Counseling given: Not Answered   Clinical Intake:  Pre-visit preparation completed: Yes  Pain : No/denies pain     Nutritional Status: BMI 25 -29 Overweight Nutritional Risks: None Diabetes: No  How often do you need to have someone help you when you read instructions, pamphlets, or other written materials from your doctor or pharmacy?: 1 - Never What is the last grade level you completed in school?: 3 years of college  Interpreter Needed?: No  Information entered by :: NAllen LPN  Past Medical History:  Diagnosis Date  . Allergic rhinitis    gets allergy shot from Dr. Carlis Abbott  . Anxiety   . Arthritis   . Diabetes mellitus without complication (HCC)    diet controlled  . GERD (gastroesophageal reflux disease)   . History of shingles    at age 40- mild  . Hx of colonic polyp   . Hyperglycemia   . Hyperlipemia    Very high chol intol of statins-does not want to check it  . Hypertension   . Panic disorder   . Tremor    from anx  . Urinary incontinence    mixed  . Voice disorder    Past Surgical History:  Procedure Laterality Date  . ABDOMINAL HYSTERECTOMY    . BLADDER SUSPENSION    . COLONOSCOPY WITH PROPOFOL N/A 12/24/2016   Procedure: COLONOSCOPY WITH PROPOFOL;  Surgeon: Manya Silvas, MD;  Location: [facility] ENDOSCOPY;  Service:  Endoscopy;  Laterality: N/A;  . ESOPHAGOGASTRODUODENOSCOPY (EGD) WITH PROPOFOL N/A 12/24/2016   Procedure: ESOPHAGOGASTRODUODENOSCOPY (EGD) WITH PROPOFOL;  Surgeon: Manya Silvas, MD;  Location: [facility] ENDOSCOPY;  Service: Endoscopy;  Laterality: N/A;  . EYE SURGERY Bilateral 2000   eye lens replacement  . HAND SURGERY Left 2016   left long trigger finger release  . JOINT REPLACEMENT     knee replacement - march 15  . KNEE ARTHROSCOPY Left 04/26/2016   Procedure: ARTHROSCOPY KNEE;  Surgeon: Hessie Knows, MD;  Location: [facility] ORS;  Service:  Orthopedics;  Laterality: Left;  Marland Kitchen MEDIAL PARTIAL KNEE REPLACEMENT Left 2016  . NASAL SINUS SURGERY    . PARTIAL HYSTERECTOMY     with fibroid  . RECTOCELE REPAIR    . SYNOVECTOMY Left 04/26/2016   Procedure: PARTIAL SYNOVECTOMY;  Surgeon: Hessie Knows, MD;  Location: [facility] ORS;  Service: Orthopedics;  Laterality: Left;  . TONSILLECTOMY     Family History  Problem Relation Age of Onset  . Diabetes Mother   . Hyperlipidemia Mother   . Hypertension Mother   . Osteoarthritis Mother   . Cancer Father        Colon  . Leukemia Sister   . Syncope episode Daughter    Social History   Socioeconomic History  . Marital status: Married    Spouse name: Not on file  . Number of children: 2  . Years of education: Not on file  . Highest education level: Not on file  Occupational History  . 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 Financial resource strain: Not hard at all  . Food insecurity    Worry: Never true    Inability: Never true  . Transportation needs    Medical: No    Non-medical: No  Tobacco Use  . Smoking status: Never Smoker  . Smokeless tobacco: Never Used  Substance and Sexual Activity  . Alcohol use: No    Alcohol/week: 0.0 standard drinks  . Drug use: No  . Sexual activity: Not Currently    Birth control/protection: None  Lifestyle  . Physical activity    Days per week: 5 days    Minutes per session: 20 min  . Stress: Not at all  Relationships  . Social Herbalist on phone: Not on file    Gets together: Not on file    Attends religious service: Not on file    Active member of club or organization: Not on file    Attends meetings of clubs or organizations: Not on file    Relationship status: Not on file  Other Topics Concern  . Not on file  Social History Narrative  . Not on file    Outpatient Encounter Medications as of 04/03/2019  Medication Sig  . ALPRAZolam (XANAX) 0.25 MG tablet Take 1 tablet  (0.25 mg total) by mouth at bedtime as needed for anxiety.  . Azelastine HCl (ASTEPRO) 0.15 % SOLN 2 sprays by Nasal route 2 (two) times daily as needed.  . Calcium Carbonate-Vitamin D (CALCIUM 600+D PO) Take 1 tablet by mouth 2 (two) times daily.   . cetirizine (ZYRTEC) 10 MG tablet Take 10 mg by mouth daily.    Marland Kitchen EPINEPHrine 0.3 mg/0.3 mL IJ SOAJ injection Inject 0.3 mg into the skin once. as needed.  . Multiple Vitamin (MULTIVITAMIN) tablet Take 1 tablet by mouth daily.    . Omega-3 Fatty Acids (FISH OIL) 1200 MG  CAPS Take 1 capsule by mouth daily.   Marland Kitchen omeprazole (PRILOSEC) 20 MG capsule TAKE 2 CAPSULES EVERY MORNING AND ONE EACH IN THE EVENING  . ramipril (ALTACE) 10 MG capsule TAKE 1 CAPSULE BY MOUTH EVERY DAY  . sertraline (ZOLOFT) 50 MG tablet Take 1.5 tablets (75 mg total) by mouth daily.  Marland Kitchen triamterene-hydrochlorothiazide (MAXZIDE-25) 37.5-25 MG tablet Take 0.5 tablets by mouth daily.   No facility-administered encounter medications on file as of 04/03/2019.     Activities of Daily Living In your present state of health, do you have any difficulty performing the following activities: 04/03/2019  Hearing? N  Vision? N  Difficulty concentrating or making decisions? N  Walking or climbing stairs? N  Dressing or bathing? N  Doing errands, shopping? N  Preparing Food and eating ? N  Using the Toilet? N  In the past six months, have you accidently leaked urine? Y  Comment just a tiny bit  Do you have problems with loss of bowel control? N  Managing your Medications? N  Managing your Finances? N  Housekeeping or managing your Housekeeping? N  Some recent data might be hidden    Patient Care Team: Tower, Wynelle Fanny, MD as PCP - General Vaught, Jeannie Fend, MD as Referring Physician (Otolaryngology) Hessie Knows, MD as Consulting Physician (Orthopedic Surgery) Rainey Pines, MD as Referring Physician (Psychiatry) Manya Silvas, MD as Consulting Physician (Gastroenterology) Eula Lane, OD as Referring Physician (Optometry)    Assessment:   This is a routine wellness examination for [name].  Exercise Activities and Dietary recommendations Current Exercise Habits: Home exercise routine, Type of exercise: walking, Time (Minutes): 20, Frequency (Times/Week): 5, Weekly Exercise (Minutes/Week): 100  Goals    . Increase physical activity     Until released from PT, I will continue to do physical therapy exercises for 60 min/day  5 days per week.     . Increase physical activity     Starting 03/28/2018, I will continue to exercise for 90 minutes 3 days per week.    . Patient Stated     Wants to continue to lower A1C     . Weight (lb) < 200 lb (90.7 kg)     Wants to lose a little more weight       Fall Risk Fall Risk  04/03/2019 03/28/2018 03/07/2017 03/07/2016 01/23/2016  Falls in the past year? 0 No No Yes Yes  Comment - - - pt tripped and fell; denies injury -  Number falls in past yr: - - - 1 1  Injury with Fall? - - - No No  Risk for fall due to : Medication side effect - - - -  Follow up Falls evaluation completed;Falls prevention discussed - - Falls evaluation completed -   Is the patient's home free of loose throw rugs in walkways, pet beds, electrical cords, etc?   yes      Grab bars in the bathroom? yes      Handrails on the stairs?   n/a      Adequate lighting?   yes  Timed Get Up and Go performed: n/a  Depression Screen PHQ 2/9 Scores 04/03/2019 03/28/2018 03/07/2017 03/07/2016  PHQ - 2 Score 0 0 0 0  PHQ- 9 Score 0 0 - -     Cognitive Function MMSE - Mini Mental State Exam 04/03/2019 03/28/2018 03/07/2017 03/07/2016  Orientation to time 5 5 5 5   Orientation to Place 5 5 5 5   Registration 3 3  3 3  Attention/ Calculation 5 0 0 0  Recall 3 3 3 3   Language- name 2 objects 0 0 0 0  Language- repeat 1 1 1 1   Language- follow 3 step command 0 3 3 3   Language- read & follow direction 0 0 0 0  Write a sentence 0 0 0 0  Copy design 0 0 0 0  Total score  22 20 20 20    Mini Cog  Mini-Cog screen was completed. Maximum score is 22. A value of 0 denotes this part of the MMSE was not completed or the patient failed this part of the Mini-Cog screening.       Immunization History  Administered Date(s) Administered  . H1N1 10/04/2008  . Hepatitis B 01/16/1992, 02/16/1992, 08/17/1992  . Influenza Split 07/18/2011, 06/04/2012  . Influenza Whole 06/17/2008, 07/19/2009, 07/11/2010  . Influenza, High Dose Seasonal PF 06/05/2017  . Influenza,inj,Quad PF,6+ Mos 06/02/2013, 06/13/2016  . Influenza,trivalent, recombinat, inj, PF 06/09/2015  . Influenza-Unspecified 06/10/2014, 06/30/2018  . Pneumococcal Polysaccharide-23 09/17/2002  . Td 10/04/2008  . Zoster 05/25/2009  . Zoster Recombinat (Shingrix) 05/07/2018, 09/18/2018    Qualifies for Shingles Vaccine? yes  Screening Tests Health Maintenance  Topic Date Due  . TETANUS/TDAP  10/04/2018  . INFLUENZA VACCINE  04/18/2019  . MAMMOGRAM  06/27/2019  . COLONOSCOPY  12/25/2026  . DEXA SCAN  Completed  . Hepatitis C Screening  Completed    Cancer Screenings: Lung: Low Dose CT Chest recommended if Age 52-80 years, 30 pack-year currently smoking OR have quit w/in 15years. Patient does not qualify. Breast:  Up to date on Mammogram? Yes   Up to date of Bone Density/Dexa? Yes Colorectal: up to date  Additional Screenings: : Hepatitis C Screening: 05/2016     Plan:    Patient wants to continue to lower A1C and lose a little more weight.   I have personally reviewed and noted the following in the patient's chart:   . Medical and social history . Use of alcohol, tobacco or illicit drugs  . Current medications and supplements . Functional ability and status . Nutritional status . Physical activity . Advanced directives . List of other physicians . Hospitalizations, surgeries, and ER visits in previous 12 months . Vitals . Screenings to include cognitive, depression, and falls .  Referrals and appointments  In addition, I have reviewed and discussed with patient certain preventive protocols, quality metrics, and best practice recommendations. A written personalized care plan for preventive services as well as general preventive health recommendations were provided to patient.     Kellie Simmering, LPN  01/18/5464

## 2019-04-03 NOTE — Progress Notes (Signed)
PCP notes:  Health Maintenance:  Tetanus is due  Abnormal Screenings:  None  Patient concerns:  None  Nurse concerns:  None  Next PCP appt.: 04/14/2019 at 11:30

## 2019-04-03 NOTE — Patient Instructions (Signed)
[name] , Thank you for taking time to come for your Medicare Wellness Visit. I appreciate your ongoing commitment to your health goals. Please review the following plan we discussed and let me know if I can assist you in the future.   Screening recommendations/referrals: Colonoscopy: 12/2016 Mammogram: 06/2018 Bone Density: 06/2018 Recommended yearly ophthalmology/optometry visit for glaucoma screening and checkup Recommended yearly dental visit for hygiene and checkup  Vaccinations: Influenza vaccine: 06/2018 Pneumococcal vaccine: allergy Tdap vaccine: due Shingles vaccine: 09/2018    Advanced directives: Please bring a copy of your POA (Power of [place]) and/or Living Will to your next appointment.    Conditions/risks identified: overweight  Next appointment: 04/14/2019 at 11:30   Preventive Care 73 Years and Older, Female Preventive care refers to lifestyle choices and visits with your health care provider that can promote health and wellness. What does preventive care include?  A yearly physical exam. This is also called an annual well check.  Dental exams once or twice a year.  Routine eye exams. Ask your health care provider how often you should have your eyes checked.  Personal lifestyle choices, including:  Daily care of your teeth and gums.  Regular physical activity.  Eating a healthy diet.  Avoiding tobacco and drug use.  Limiting alcohol use.  Practicing safe sex.  Taking low-dose aspirin every day.  Taking vitamin and mineral supplements as recommended by your health care provider. What happens during an annual well check? The services and screenings done by your health care provider during your annual well check will depend on your age, overall health, lifestyle risk factors, and family history of disease. Counseling  Your health care provider may ask you questions about your:  Alcohol use.  Tobacco use.  Drug use.  Emotional well-being.   Home and relationship well-being.  Sexual activity.  Eating habits.  History of falls.  Memory and ability to understand (cognition).  Work and work Statistician.  Reproductive health. Screening  You may have the following tests or measurements:  Height, weight, and BMI.  Blood pressure.  Lipid and cholesterol levels. These may be checked every 5 years, or more frequently if you are over 40 years old.  Skin check.  Lung cancer screening. You may have this screening every year starting at age 52 if you have a 30-pack-year history of smoking and currently smoke or have quit within the past 15 years.  Fecal occult blood test (FOBT) of the stool. You may have this test every year starting at age 13.  Flexible sigmoidoscopy or colonoscopy. You may have a sigmoidoscopy every 5 years or a colonoscopy every 10 years starting at age 24.  Hepatitis C blood test.  Hepatitis B blood test.  Sexually transmitted disease (STD) testing.  Diabetes screening. This is done by checking your blood sugar (glucose) after you have not eaten for a while (fasting). You may have this done every 1-3 years.  Bone density scan. This is done to screen for osteoporosis. You may have this done starting at age 58.  Mammogram. This may be done every 1-2 years. Talk to your health care provider about how often you should have regular mammograms. Talk with your health care provider about your test results, treatment options, and if necessary, the need for more tests. Vaccines  Your health care provider may recommend certain vaccines, such as:  Influenza vaccine. This is recommended every year.  Tetanus, diphtheria, and acellular pertussis (Tdap, Td) vaccine. You may need a Td booster  every 10 years.  Zoster vaccine. You may need this after age 18.  Pneumococcal 13-valent conjugate (PCV13) vaccine. One dose is recommended after age 67.  Pneumococcal polysaccharide (PPSV23) vaccine. One dose is  recommended after age 15. Talk to your health care provider about which screenings and vaccines you need and how often you need them. This information is not intended to replace advice given to you by your health care provider. Make sure you discuss any questions you have with your health care provider. Document Released: 09/30/2015 Document Revised: 05/23/2016 Document Reviewed: 07/05/2015 Elsevier Interactive Patient Education  2017 [place] Prevention in the Home Falls can cause injuries. They can happen to people of all ages. There are many things you can do to make your home safe and to help prevent falls. What can I do on the outside of my home?  Regularly fix the edges of walkways and driveways and fix any cracks.  Remove anything that might make you trip as you walk through a door, such as a raised step or threshold.  Trim any bushes or trees on the path to your home.  Use bright outdoor lighting.  Clear any walking paths of anything that might make someone trip, such as rocks or tools.  Regularly check to see if handrails are loose or broken. Make sure that both sides of any steps have handrails.  Any raised decks and porches should have guardrails on the edges.  Have any leaves, snow, or ice cleared regularly.  Use sand or salt on walking paths during winter.  Clean up any spills in your garage right away. This includes oil or grease spills. What can I do in the bathroom?  Use night lights.  Install grab bars by the toilet and in the tub and shower. Do not use towel bars as grab bars.  Use non-skid mats or decals in the tub or shower.  If you need to sit down in the shower, use a plastic, non-slip stool.  Keep the floor dry. Clean up any water that spills on the floor as soon as it happens.  Remove soap buildup in the tub or shower regularly.  Attach bath mats securely with double-sided non-slip rug tape.  Do not have throw rugs and other things on  the floor that can make you trip. What can I do in the bedroom?  Use night lights.  Make sure that you have a light by your bed that is easy to reach.  Do not use any sheets or blankets that are too big for your bed. They should not hang down onto the floor.  Have a firm chair that has side arms. You can use this for support while you get dressed.  Do not have throw rugs and other things on the floor that can make you trip. What can I do in the kitchen?  Clean up any spills right away.  Avoid walking on wet floors.  Keep items that you use a lot in easy-to-reach places.  If you need to reach something above you, use a strong step stool that has a grab bar.  Keep electrical cords out of the way.  Do not use floor polish or wax that makes floors slippery. If you must use wax, use non-skid floor wax.  Do not have throw rugs and other things on the floor that can make you trip. What can I do with my stairs?  Do not leave any items on the stairs.  Make  sure that there are handrails on both sides of the stairs and use them. Fix handrails that are broken or loose. Make sure that handrails are as long as the stairways.  Check any carpeting to make sure that it is firmly attached to the stairs. Fix any carpet that is loose or worn.  Avoid having throw rugs at the top or bottom of the stairs. If you do have throw rugs, attach them to the floor with carpet tape.  Make sure that you have a light switch at the top of the stairs and the bottom of the stairs. If you do not have them, ask someone to add them for you. What else can I do to help prevent falls?  Wear shoes that:  Do not have high heels.  Have rubber bottoms.  Are comfortable and fit you well.  Are closed at the toe. Do not wear sandals.  If you use a stepladder:  Make sure that it is fully opened. Do not climb a closed stepladder.  Make sure that both sides of the stepladder are locked into place.  Ask someone to  hold it for you, if possible.  Clearly mark and make sure that you can see:  Any grab bars or handrails.  First and last steps.  Where the edge of each step is.  Use tools that help you move around (mobility aids) if they are needed. These include:  Canes.  Walkers.  Scooters.  Crutches.  Turn on the lights when you go into a dark area. Replace any light bulbs as soon as they burn out.  Set up your furniture so you have a clear path. Avoid moving your furniture around.  If any of your floors are uneven, fix them.  If there are any pets around you, be aware of where they are.  Review your medicines with your doctor. Some medicines can make you feel dizzy. This can increase your chance of falling. Ask your doctor what other things that you can do to help prevent falls. This information is not intended to replace advice given to you by your health care provider. Make sure you discuss any questions you have with your health care provider. Document Released: 06/30/2009 Document Revised: 02/09/2016 Document Reviewed: 10/08/2014 Elsevier Interactive Patient Education  2017 [employer].

## 2019-04-07 ENCOUNTER — Ambulatory Visit: Payer: Medicare Other

## 2019-04-14 ENCOUNTER — Ambulatory Visit (INDEPENDENT_AMBULATORY_CARE_PROVIDER_SITE_OTHER): Payer: Medicare Other | Admitting: Family Medicine

## 2019-04-14 ENCOUNTER — Encounter: Payer: Self-pay | Admitting: Family Medicine

## 2019-04-14 ENCOUNTER — Other Ambulatory Visit: Payer: Self-pay

## 2019-04-14 VITALS — BP 128/72 | HR 87 | Temp 96.9°F | Ht 63.75 in | Wt 172.3 lb

## 2019-04-14 DIAGNOSIS — Z0000 Encounter for general adult medical examination without abnormal findings: Secondary | ICD-10-CM | POA: Diagnosis not present

## 2019-04-14 DIAGNOSIS — E039 Hypothyroidism, unspecified: Secondary | ICD-10-CM | POA: Diagnosis not present

## 2019-04-14 DIAGNOSIS — I10 Essential (primary) hypertension: Secondary | ICD-10-CM

## 2019-04-14 DIAGNOSIS — E7800 Pure hypercholesterolemia, unspecified: Secondary | ICD-10-CM | POA: Diagnosis not present

## 2019-04-14 DIAGNOSIS — F32A Depression, unspecified: Secondary | ICD-10-CM

## 2019-04-14 DIAGNOSIS — F320 Major depressive disorder, single episode, mild: Secondary | ICD-10-CM

## 2019-04-14 DIAGNOSIS — M85859 Other specified disorders of bone density and structure, unspecified thigh: Secondary | ICD-10-CM | POA: Diagnosis not present

## 2019-04-14 DIAGNOSIS — E663 Overweight: Secondary | ICD-10-CM

## 2019-04-14 DIAGNOSIS — E038 Other specified hypothyroidism: Secondary | ICD-10-CM

## 2019-04-14 DIAGNOSIS — R7303 Prediabetes: Secondary | ICD-10-CM

## 2019-04-14 MED ORDER — TRIAMTERENE-HCTZ 37.5-25 MG PO TABS: 0.5000 | ORAL_TABLET | Freq: Every day | ORAL | 3 refills | Status: DC

## 2019-04-14 MED ORDER — OMEPRAZOLE 20 MG PO CPDR: DELAYED_RELEASE_CAPSULE | ORAL | 3 refills | Status: DC

## 2019-04-14 MED ORDER — RAMIPRIL 10 MG PO CAPS: ORAL_CAPSULE | ORAL | 3 refills | Status: DC

## 2019-04-14 NOTE — Patient Instructions (Addendum)
Don't forget to get a flu shot in the fall   You are borderline hypothyroid Let me know if you develop sluggishness/ fatigue or hair/skin changes   Keep checking with your insurance to see if a PCYK9 inhibitor would be covered better (that new cholesterol medicine injection)  Keep watching diet for cholesterol Avoid red meat/ fried foods/ egg yolks/ fatty breakfast meats/ butter, cheese and high fat dairy/ and shellfish

## 2019-04-14 NOTE — Progress Notes (Signed)
Subjective:    Patient ID: [name], female    DOB: 05/25/1946, 73 y.o.   MRN: [id number]  HPI Here for health maintenance exam and to review chronic medical problems    Feeling good  Nothing new  Wearing mask and staying home    amw was 7/17  No concerns or gaps   Td 1/10 - will check with pharmacy about coverage  Gets flu shot yearly-good about that   Mammogram 10/19 - due in oct  Self breast exam -no breast lumps   Colonoscopy 4/18  H/o colon cancer in her father   Sees ENT tomorrow  Will schedule eye exam   dexa 10/19 -osteopenia slightly progressed Falls-none  Fractures-none  Supplements taking vit D and ca  Exercise - not as much (used to do water aerobics before pandemic)  Walks her dog  Does her PT /back exercises   Weight  Wt Readings from Last 3 Encounters:  04/14/19 172 lb 5 oz (78.2 kg)  04/03/19 170 lb (77.1 kg)  04/04/18 171 lb (77.6 kg)  no change-fairly stable Eats healthy  29.81 kg/m   bp is stable today  No cp or palpitations or headaches or edema  No side effects to medicines  BP Readings from Last 3 Encounters:  04/14/19 128/72  04/04/18 120/72  03/28/18 102/78      Lab Results  Component Value Date   CREATININE 1.03 04/03/2019   BUN 25 (H) 04/03/2019   NA 139 04/03/2019   K 4.3 04/03/2019   CL 101 04/03/2019   CO2 28 04/03/2019   Lab Results  Component Value Date   ALT 18 04/03/2019   AST 18 04/03/2019   ALKPHOS 72 04/03/2019   BILITOT 0.8 04/03/2019   Lab Results  Component Value Date   WBC 8.3 04/03/2019   HGB 15.3 (H) 04/03/2019   HCT 45.1 04/03/2019   MCV 88.2 04/03/2019   PLT 254.0 04/03/2019   Hb is close to usual  Non smoker Not exp to smoke or fumes   H/o abn tsh  Lab Results  Component Value Date   TSH 7.39 (H) 04/03/2019   free T4 is 0.74 Not more tired than usual  No hair loss  Skin is not changed     Hyperlipidemia Lab Results  Component Value Date   CHOL 355 (H) 04/03/2019   CHOL  309 (H) 03/28/2018   CHOL 326 (H) 03/07/2017   Lab Results  Component Value Date   HDL 44.20 04/03/2019   HDL 48.50 03/28/2018   HDL 47.40 03/07/2017   Lab Results  Component Value Date   LDLCALC 287 (H) 04/03/2019   LDLCALC 242 (H) 03/28/2018   LDLCALC 257 (H) 03/07/2017   Lab Results  Component Value Date   TRIG 118.0 04/03/2019   TRIG 91.0 03/28/2018   TRIG 108.0 03/07/2017   Lab Results  Component Value Date   CHOLHDL 8 04/03/2019   CHOLHDL 6 03/28/2018   CHOLHDL 7 03/07/2017   Lab Results  Component Value Date   LDLDIRECT 242.8 01/15/2012   LDLDIRECT 237.0 12/18/2010   LDLDIRECT 227.8 09/20/2009   Intolerant of statins  Has seen cardiology to discuss PCYK9 tx and it is not affordable (even if covered)  Diet -excellent  LDL 287 -severely high - up more  She has been to the lipid clinic     Prediabetes Lab Results  Component Value Date   HGBA1C 6.2 04/03/2019  stable  Last 6.3 She eats well  Mood -doing well on current medications  Dep/anx  Sees psychiatry -doing very well     Patient Active Problem List   Diagnosis Date Noted  . Osteopenia 02/13/2016  . Routine general medical examination at a health care facility 01/23/2016  . Estrogen deficiency 01/23/2016  . Subclinical hypothyroidism 01/23/2016  . Mild depression ([place]) 02/21/2015  . H/O diabetes mellitus 01/04/2015  . H/O arthritis 01/04/2015  . History of hay fever 01/04/2015  . Encounter for Medicare annual wellness exam 01/14/2013  . Overweight (BMI 25.0-29.9) 01/15/2012  . MENOPAUSAL SYNDROME 10/04/2008  . Prediabetes 12/02/2007  . TREMOR 12/02/2007  . DEPRESSION 08/04/2007  . Hyperlipidemia 01/28/2007  . ANXIETY 01/28/2007  . Essential hypertension 01/28/2007  . GERD 01/28/2007  . URINARY INCONTINENCE 01/28/2007  . COLONIC POLYPS, HX OF 01/28/2007  . HERPES ZOSTER 01/20/2007  . DYSPHONIA 01/20/2007  . COUGH, CHRONIC 01/20/2007  . ALLERGY 01/20/2007   Past Medical  History:  Diagnosis Date  . Allergic rhinitis    gets allergy shot from Dr. Carlis Abbott  . Anxiety   . Arthritis   . Diabetes mellitus without complication (HCC)    diet controlled  . GERD (gastroesophageal reflux disease)   . History of shingles    at age 29- mild  . Hx of colonic polyp   . Hyperglycemia   . Hyperlipemia    Very high chol intol of statins-does not want to check it  . Hypertension   . Panic disorder   . Tremor    from anx  . Urinary incontinence    mixed  . Voice disorder    Past Surgical History:  Procedure Laterality Date  . ABDOMINAL HYSTERECTOMY    . BLADDER SUSPENSION    . COLONOSCOPY WITH PROPOFOL N/A 12/24/2016   Procedure: COLONOSCOPY WITH PROPOFOL;  Surgeon: Manya Silvas, MD;  Location[facility] ENDOSCOPY;  Service: Endoscopy;  Laterality: N/A;  . ESOPHAGOGASTRODUODENOSCOPY (EGD) WITH PROPOFOL N/A 12/24/2016   Procedure: ESOPHAGOGASTRODUODENOSCOPY (EGD) WITH PROPOFOL;  Surgeon: Manya Silvas, MD;  Location: [facility] ENDOSCOPY;  Service: Endoscopy;  Laterality: N/A;  . EYE SURGERY Bilateral 2000   eye lens replacement  . HAND SURGERY Left 2016   left long trigger finger release  . JOINT REPLACEMENT     knee replacement - march 15  . KNEE ARTHROSCOPY Left 04/26/2016   Procedure: ARTHROSCOPY KNEE;  Surgeon: Hessie Knows, MD;  Location: [facility] ORS;  Service: Orthopedics;  Laterality: Left;  Marland Kitchen MEDIAL PARTIAL KNEE REPLACEMENT Left 2016  . NASAL SINUS SURGERY    . PARTIAL HYSTERECTOMY     with fibroid  . RECTOCELE REPAIR    . SYNOVECTOMY Left 04/26/2016   Procedure: PARTIAL SYNOVECTOMY;  Surgeon: Hessie Knows, MD;  Location: [facility] ORS;  Service: Orthopedics;  Laterality: Left;  . TONSILLECTOMY     Social History   Tobacco Use  . Smoking status: Never Smoker  . Smokeless tobacco: Never Used  Substance Use Topics  . Alcohol use: No    Alcohol/week: 0.0 standard drinks  . Drug use: No   Family History  Problem Relation Age of Onset  . Diabetes Mother   .  Hyperlipidemia Mother   . Hypertension Mother   . Osteoarthritis Mother   . Cancer Father        Colon  . Leukemia Sister   . Syncope episode Daughter    Allergies  Allergen Reactions  . Atorvastatin     REACTION: shoulder pain  . Ezetimibe     REACTION: ?  Marland Kitchen  Lisinopril     REACTION: muscle pain  . Molds & Smuts   . Penicillins     REACTION: reaction not known Has patient had a PCN reaction causing immediate rash, facial/tongue/throat swelling, SOB or lightheadedness with hypotension: unknown Has patient had a PCN reaction causing severe rash involving mucus membranes or skin necrosis: unknown Has patient had a PCN reaction that required hospitalization unsure Has patient had a PCN reaction occurring within the last 10 years: no If all of the above answers are "NO", then may proceed with Cephalosporin use.  . Pneumococcal Vaccine Swelling    REACTION: severe local reaction  . Pneumococcal Vaccine Polyvalent     REACTION: severe local reaction  . Pollen Extract   . Rosuvastatin     REACTION: leg pain  . Simvastatin     REACTION: muscle pain   Current Outpatient Medications on File Prior to Visit  Medication Sig Dispense Refill  . ALPRAZolam (XANAX) 0.25 MG tablet Take 1 tablet (0.25 mg total) by mouth at bedtime as needed for anxiety. 90 tablet 1  . Azelastine HCl (ASTEPRO) 0.15 % SOLN 2 sprays by Nasal route 2 (two) times daily as needed. 30 mL 11  . Calcium Carbonate-Vitamin D (CALCIUM 600+D PO) Take 1 tablet by mouth 2 (two) times daily.     . cetirizine (ZYRTEC) 10 MG tablet Take 10 mg by mouth daily.      Marland Kitchen EPINEPHrine 0.3 mg/0.3 mL IJ SOAJ injection Inject 0.3 mg into the skin once. as needed.    . Multiple Vitamin (MULTIVITAMIN) tablet Take 1 tablet by mouth daily.      . Omega-3 Fatty Acids (FISH OIL) 1200 MG CAPS Take 1 capsule by mouth daily.     . sertraline (ZOLOFT) 50 MG tablet Take 1.5 tablets (75 mg total) by mouth daily. 135 tablet 1   No current  facility-administered medications on file prior to visit.     Review of Systems  Constitutional: Negative for activity change, appetite change, fatigue, fever and unexpected weight change.  HENT: Negative for congestion, ear pain, rhinorrhea, sinus pressure and sore throat.   Eyes: Negative for pain, redness and visual disturbance.  Respiratory: Negative for cough, shortness of breath and wheezing.   Cardiovascular: Negative for chest pain and palpitations.  Gastrointestinal: Negative for abdominal pain, blood in stool, constipation and diarrhea.  Endocrine: Negative for polydipsia and polyuria.  Genitourinary: Negative for dysuria, frequency and urgency.  Musculoskeletal: Positive for back pain. Negative for arthralgias and myalgias.  Skin: Negative for pallor and rash.  Allergic/Immunologic: Negative for environmental allergies.  Neurological: Negative for dizziness, syncope and headaches.  Hematological: Negative for adenopathy. Does not bruise/bleed easily.  Psychiatric/Behavioral: Negative for decreased concentration and dysphoric mood. The patient is not nervous/anxious.        Mood is better       Objective:   Physical Exam Constitutional:      General: She is not in acute distress.    Appearance: She is well-developed. She is obese. She is not ill-appearing.  HENT:     Head: Normocephalic and atraumatic.     Right Ear: Tympanic membrane, ear canal and external ear normal.     Left Ear: Tympanic membrane, ear canal and external ear normal.     Nose: Nose normal.     Mouth/Throat:     Mouth: Mucous membranes are moist.     Pharynx: Oropharynx is clear. No posterior oropharyngeal erythema.     Comments: Baseline dysphonia  Eyes:     General: No scleral icterus.    Conjunctiva/sclera: Conjunctivae normal.     Pupils: Pupils are equal, round, and reactive to light.  Neck:     Musculoskeletal: Normal range of motion and neck supple. No neck rigidity or muscular tenderness.      Thyroid: No thyromegaly.     Vascular: No carotid bruit or JVD.  Cardiovascular:     Rate and Rhythm: Normal rate and regular rhythm.     Pulses: Normal pulses.     Heart sounds: Normal heart sounds. No murmur. No gallop.   Pulmonary:     Effort: Pulmonary effort is normal. No respiratory distress.     Breath sounds: Normal breath sounds. No wheezing.     Comments: Good air exch Chest:     Chest wall: No tenderness.  Abdominal:     General: Bowel sounds are normal. There is no distension or abdominal bruit.     Palpations: Abdomen is soft. There is no mass.     Tenderness: There is no abdominal tenderness.  Genitourinary:    Comments: Breast exam: No mass, nodules, thickening, tenderness, bulging, retraction, inflamation, nipple discharge or skin changes noted.  No axillary or clavicular LA.     Musculoskeletal: Normal range of motion.        General: No tenderness.     Right lower leg: No edema.     Left lower leg: No edema.  Lymphadenopathy:     Cervical: No cervical adenopathy.  Skin:    General: Skin is warm and dry.     Coloration: Skin is not pale.     Findings: No erythema or rash.     Comments: Solar lentigines diffusely   Neurological:     Mental Status: She is alert. Mental status is at baseline.     Cranial Nerves: No cranial nerve deficit.     Motor: No abnormal muscle tone.     Coordination: Coordination normal.     Gait: Gait normal.     Deep Tendon Reflexes: Reflexes are normal and symmetric. Reflexes normal.     Comments: No tremor   Psychiatric:        Mood and Affect: Mood normal.           Assessment & Plan:   Problem List Items Addressed This Visit      Cardiovascular and Mediastinum   Essential hypertension    bp in fair control at this time  BP Readings from Last 1 Encounters:  04/14/19 128/72   No changes needed Most recent labs reviewed  Disc lifstyle change with low sodium diet and exercise        Relevant Medications    ramipril (ALTACE) 10 MG capsule   triamterene-hydrochlorothiazide (MAXZIDE-25) 37.5-25 MG tablet     Endocrine   Subclinical hypothyroidism    Lab Results  Component Value Date   TSH 7.39 (H) 04/03/2019  with nl FT4 of 0.74 Suspect she will progress to non sub clinical in the future  Continue to watch labs/and also for symptoms         Musculoskeletal and Integument   Osteopenia    dexa 10/19 - osteopenia slightly progressed No falls or fx Taking ca and D Could use more exercise as tolerated        Other   Prediabetes    Lab Results  Component Value Date   HGBA1C 6.2 04/03/2019   This is stable disc imp of low glycemic diet and  wt loss to prevent DM2       Hyperlipidemia    Severely high LDL Disc goals for lipids and reasons to control them Rev last labs with pt Rev low sat fat diet in detail Intol of all statins  Has been to lipid clinic Ins will not pay enough for pcyk9 to afford it unfortunately       Relevant Medications   ramipril (ALTACE) 10 MG capsule   triamterene-hydrochlorothiazide (MAXZIDE-25) 37.5-25 MG tablet   Overweight (BMI 25.0-29.9)    Discussed how this problem influences overall health and the risks it imposes  Reviewed plan for weight loss with lower calorie diet (via better food choices and also portion control or program like weight watchers) and exercise building up to or more than 30 minutes 5 days per week including some aerobic activity         Mild depression ([place])    With anxiety  Improved from prior Continues psychiatric care Enc good self care      Routine general medical examination at a health care facility - Primary    Reviewed health habits including diet and exercise and skin cancer prevention Reviewed appropriate screening tests for age  Also reviewed health mt list, fam hx and immunization status , as well as social and family history   See HPI Labs reviewed  amw reviewed  She plans on flu shot in the fall  Watching thyroid  Enc more exercise  Enc ca and D and exercise for bone health

## 2019-04-15 NOTE — Assessment & Plan Note (Signed)
Reviewed health habits including diet and exercise and skin cancer prevention Reviewed appropriate screening tests for age  Also reviewed health mt list, fam hx and immunization status , as well as social and family history   See HPI Labs reviewed  amw reviewed  She plans on flu shot in the fall Watching thyroid  Enc more exercise  Enc ca and D and exercise for bone health

## 2019-04-15 NOTE — Assessment & Plan Note (Signed)
Discussed how this problem influences overall health and the risks it imposes  Reviewed plan for weight loss with lower calorie diet (via better food choices and also portion control or program like weight watchers) and exercise building up to or more than 30 minutes 5 days per week including some aerobic activity    

## 2019-04-15 NOTE — Assessment & Plan Note (Signed)
With anxiety  Improved from prior Continues psychiatric care Enc good self care

## 2019-04-15 NOTE — Assessment & Plan Note (Signed)
dexa 10/19 - osteopenia slightly progressed No falls or fx Taking ca and D Could use more exercise as tolerated

## 2019-04-15 NOTE — Assessment & Plan Note (Signed)
Severely high LDL Disc goals for lipids and reasons to control them Rev last labs with pt Rev low sat fat diet in detail Intol of all statins  Has been to lipid clinic Ins will not pay enough for pcyk9 to afford it unfortunately

## 2019-04-15 NOTE — Assessment & Plan Note (Signed)
Lab Results  Component Value Date   TSH 7.39 (H) 04/03/2019  with nl FT4 of 0.74 Suspect she will progress to non sub clinical in the future  Continue to watch labs/and also for symptoms

## 2019-04-15 NOTE — Assessment & Plan Note (Signed)
Lab Results  Component Value Date   HGBA1C 6.2 04/03/2019   This is stable disc imp of low glycemic diet and wt loss to prevent DM2

## 2019-04-15 NOTE — Assessment & Plan Note (Signed)
bp in fair control at this time  BP Readings from Last 1 Encounters:  04/14/19 128/72   No changes needed Most recent labs reviewed  Disc lifstyle change with low sodium diet and exercise

## 2019-04-23 ENCOUNTER — Ambulatory Visit (INDEPENDENT_AMBULATORY_CARE_PROVIDER_SITE_OTHER)
Admission: RE | Admit: 2019-04-23 | Discharge: 2019-04-23 | Disposition: A | Discharge status: Home / Self Care | Payer: Medicare Other | Source: Ambulatory Visit | Attending: Family Medicine | Admitting: Family Medicine

## 2019-04-23 ENCOUNTER — Encounter: Payer: Self-pay | Admitting: Family Medicine

## 2019-04-23 ENCOUNTER — Telehealth: Payer: Self-pay

## 2019-04-23 ENCOUNTER — Ambulatory Visit (INDEPENDENT_AMBULATORY_CARE_PROVIDER_SITE_OTHER): Payer: Medicare Other | Admitting: Family Medicine

## 2019-04-23 ENCOUNTER — Other Ambulatory Visit: Payer: Self-pay

## 2019-04-23 VITALS — BP 124/70 | HR 89 | Temp 97.7°F | Ht 63.75 in | Wt 173.4 lb

## 2019-04-23 DIAGNOSIS — R0789 Other chest pain: Secondary | ICD-10-CM | POA: Insufficient documentation

## 2019-04-23 DIAGNOSIS — I10 Essential (primary) hypertension: Secondary | ICD-10-CM

## 2019-04-23 DIAGNOSIS — R079 Chest pain, unspecified: Secondary | ICD-10-CM

## 2019-04-23 NOTE — Telephone Encounter (Signed)
Pt seen for annual on 04/14/19. On 04/17/19 pt started with dull burning CP all the way across the chest which has remained consistent. Pain level now is 5. When moves a certain way or takes deep breath increases pain. Pt feeling tired. No radiation of pain. No congestion; Pt has no covid symptoms, no travel and no known exposure to + covid. No dizziness. Pt sounds like her voice breaks up on and off but pt said has hx of spasm in throat and that causes the voice to do that.again confirmed no covid symptoms. Pt is in no distress. Pt scheduled in office visit with Dr Glori Bickers 04/23/19 at 10:45.

## 2019-04-23 NOTE — Telephone Encounter (Signed)
I will see her then  

## 2019-04-23 NOTE — Progress Notes (Signed)
Subjective:    Patient ID: [name], female    DOB: [birth year], 73 y.o.   MRN: [id number]  HPI Here for c/o chest discomfort   Wt Readings from Last 3 Encounters:  04/23/19 173 lb 7 oz (78.7 kg)  04/14/19 172 lb 5 oz (78.2 kg)  04/03/19 170 lb (77.1 kg)   30.00 kg/m   BP Readings from Last 3 Encounters:  04/23/19 124/70  04/14/19 128/72  04/04/18 120/72   Pulse Readings from Last 3 Encounters:  04/23/19 89  04/14/19 87  04/04/18 81    EKG today: NSR of 84 with low voltage ? pulm cause vs nl variant  No acute changes   H/o severely high LDL and intol of low statins Has been to the lipid clinic and cannot afford pcyk9 inhibitor medicine    Chest pain goes across chest  Dull burning  Worse to lean back  Worse to take a deep breath  Feels like breasts are sore -not tender however and no lumps   Chronic pnd  Usual vocal dystonia  Not coughing  Does occ clear her throat   It does not feel like her acid reflux is the cause   Xanax dose was cut by her psychiatrist  She started melatonin   No more anx  Mood is good   No new exercise or physical activity   occ a little light headed when she gets up  No nausea or spinning sensation   Patient Active Problem List   Diagnosis Date Noted  . Chest wall pain 04/23/2019  . Osteopenia 02/13/2016  . Routine general medical examination at a health care facility 01/23/2016  . Estrogen deficiency 01/23/2016  . Subclinical hypothyroidism 01/23/2016  . Mild depression ([place]) 02/21/2015  . H/O diabetes mellitus 01/04/2015  . H/O arthritis 01/04/2015  . History of hay fever 01/04/2015  . Encounter for Medicare annual wellness exam 01/14/2013  . Overweight (BMI 25.0-29.9) 01/15/2012  . MENOPAUSAL SYNDROME 10/04/2008  . Prediabetes 12/02/2007  . TREMOR 12/02/2007  . DEPRESSION 08/04/2007  . Hyperlipidemia 01/28/2007  . ANXIETY 01/28/2007  . Essential hypertension 01/28/2007  . GERD 01/28/2007  . URINARY  INCONTINENCE 01/28/2007  . COLONIC POLYPS, HX OF 01/28/2007  . HERPES ZOSTER 01/20/2007  . DYSPHONIA 01/20/2007  . COUGH, CHRONIC 01/20/2007  . ALLERGY 01/20/2007   Past Medical History:  Diagnosis Date  . Allergic rhinitis    gets allergy shot from Dr. Carlis Abbott  . Anxiety   . Arthritis   . Diabetes mellitus without complication (HCC)    diet controlled  . GERD (gastroesophageal reflux disease)   . History of shingles    at age 16- mild  . Hx of colonic polyp   . Hyperglycemia   . Hyperlipemia    Very high chol intol of statins-does not want to check it  . Hypertension   . Panic disorder   . Tremor    from anx  . Urinary incontinence    mixed  . Voice disorder    Past Surgical History:  Procedure Laterality Date  . ABDOMINAL HYSTERECTOMY    . BLADDER SUSPENSION    . COLONOSCOPY WITH PROPOFOL N/A 12/24/2016   Procedure: COLONOSCOPY WITH PROPOFOL;  Surgeon: Manya Silvas, MD;  Location: [facility] ENDOSCOPY;  Service: Endoscopy;  Laterality: N/A;  . ESOPHAGOGASTRODUODENOSCOPY (EGD) WITH PROPOFOL N/A 12/24/2016   Procedure: ESOPHAGOGASTRODUODENOSCOPY (EGD) WITH PROPOFOL;  Surgeon: Manya Silvas, MD;  Location: [facility] ENDOSCOPY;  Service: Endoscopy;  Laterality: N/A;  .  EYE SURGERY Bilateral 2000   eye lens replacement  . HAND SURGERY Left 2016   left long trigger finger release  . JOINT REPLACEMENT     knee replacement - march 15  . KNEE ARTHROSCOPY Left 04/26/2016   Procedure: ARTHROSCOPY KNEE;  Surgeon: Hessie Knows, MD;  Location: [facility] ORS;  Service: Orthopedics;  Laterality: Left;  Marland Kitchen MEDIAL PARTIAL KNEE REPLACEMENT Left 2016  . NASAL SINUS SURGERY    . PARTIAL HYSTERECTOMY     with fibroid  . RECTOCELE REPAIR    . SYNOVECTOMY Left 04/26/2016   Procedure: PARTIAL SYNOVECTOMY;  Surgeon: Hessie Knows, MD;  Location: [facility] ORS;  Service: Orthopedics;  Laterality: Left;  . TONSILLECTOMY     Social History   Tobacco Use  . Smoking status: Never Smoker  . Smokeless tobacco:  Never Used  Substance Use Topics  . Alcohol use: No    Alcohol/week: 0.0 standard drinks  . Drug use: No   Family History  Problem Relation Age of Onset  . Diabetes Mother   . Hyperlipidemia Mother   . Hypertension Mother   . Osteoarthritis Mother   . Cancer Father        Colon  . Leukemia Sister   . Syncope episode Daughter    Allergies  Allergen Reactions  . Atorvastatin     REACTION: shoulder pain  . Ezetimibe     REACTION: ?  . Lisinopril     REACTION: muscle pain  . Molds & Smuts   . Penicillins     REACTION: reaction not known Has patient had a PCN reaction causing immediate rash, facial/tongue/throat swelling, SOB or lightheadedness with hypotension: unknown Has patient had a PCN reaction causing severe rash involving mucus membranes or skin necrosis: unknown Has patient had a PCN reaction that required hospitalization unsure Has patient had a PCN reaction occurring within the last 10 years: no If all of the above answers are "NO", then may proceed with Cephalosporin use.  . Pneumococcal Vaccine Swelling    REACTION: severe local reaction  . Pneumococcal Vaccine Polyvalent     REACTION: severe local reaction  . Pollen Extract   . Rosuvastatin     REACTION: leg pain  . Simvastatin     REACTION: muscle pain   Current Outpatient Medications on File Prior to Visit  Medication Sig Dispense Refill  . ALPRAZolam (XANAX) 0.25 MG tablet Take 1 tablet (0.25 mg total) by mouth at bedtime as needed for anxiety. 90 tablet 1  . Azelastine HCl (ASTEPRO) 0.15 % SOLN 2 sprays by Nasal route 2 (two) times daily as needed. 30 mL 11  . Calcium Carbonate-Vitamin D (CALCIUM 600+D PO) Take 1 tablet by mouth 2 (two) times daily.     . cetirizine (ZYRTEC) 10 MG tablet Take 10 mg by mouth daily.      Marland Kitchen EPINEPHrine 0.3 mg/0.3 mL IJ SOAJ injection Inject 0.3 mg into the skin once. as needed.    . Melatonin 5 MG TABS Take 10 mg by mouth at bedtime as needed.    . Multiple Vitamin  (MULTIVITAMIN) tablet Take 1 tablet by mouth daily.      . Omega-3 Fatty Acids (FISH OIL) 1200 MG CAPS Take 1 capsule by mouth daily.     Marland Kitchen omeprazole (PRILOSEC) 20 MG capsule TAKE 2 CAPSULES EVERY MORNING AND ONE EACH IN THE EVENING 270 capsule 3  . ramipril (ALTACE) 10 MG capsule TAKE 1 CAPSULE BY MOUTH EVERY DAY 90 capsule 3  .  sertraline (ZOLOFT) 50 MG tablet Take 1.5 tablets (75 mg total) by mouth daily. 135 tablet 1  . triamterene-hydrochlorothiazide (MAXZIDE-25) 37.5-25 MG tablet Take 0.5 tablets by mouth daily. 45 tablet 3   No current facility-administered medications on file prior to visit.     Review of Systems  Constitutional: Negative for activity change, appetite change, chills, diaphoresis, fatigue, fever and unexpected weight change.  HENT: Negative for congestion, sore throat and trouble swallowing.   Eyes: Negative for discharge and visual disturbance.  Respiratory: Negative for apnea, cough, choking, chest tightness, shortness of breath, wheezing and stridor.   Cardiovascular: Positive for chest pain. Negative for palpitations and leg swelling.  Gastrointestinal: Negative for abdominal distention, abdominal pain and nausea.  Endocrine: Negative for cold intolerance and heat intolerance.  Neurological: Negative for dizziness and headaches.  Hematological: Negative for adenopathy.  Psychiatric/Behavioral: The patient is not nervous/anxious.        Objective:   Physical Exam Constitutional:      General: She is not in acute distress.    Appearance: She is well-developed. She is obese. She is not ill-appearing or diaphoretic.  HENT:     Head: Normocephalic and atraumatic.  Eyes:     Extraocular Movements: Extraocular movements intact.     Conjunctiva/sclera: Conjunctivae normal.     Pupils: Pupils are equal, round, and reactive to light.  Neck:     Musculoskeletal: Normal range of motion and neck supple. No neck rigidity or muscular tenderness.     Thyroid: No  thyromegaly.     Vascular: No carotid bruit or JVD.  Cardiovascular:     Rate and Rhythm: Normal rate and regular rhythm.     Pulses: Normal pulses.     Heart sounds: Normal heart sounds. No gallop.   Pulmonary:     Effort: Pulmonary effort is normal. No respiratory distress.     Breath sounds: Normal breath sounds. No stridor. No wheezing, rhonchi or rales.     Comments: Discomfort to take a very deep breath Chest:     Chest wall: No tenderness.  Abdominal:     General: Bowel sounds are normal. There is no distension or abdominal bruit.     Palpations: Abdomen is soft. There is no mass.     Tenderness: There is no abdominal tenderness.     Hernia: No hernia is present.  Musculoskeletal:     Right lower leg: No edema.     Left lower leg: No edema.  Lymphadenopathy:     Cervical: No cervical adenopathy.  Skin:    General: Skin is warm and dry.     Findings: No rash.  Neurological:     Mental Status: She is alert. Mental status is at baseline.     Coordination: Coordination normal.     Deep Tendon Reflexes: Reflexes are normal and symmetric. Reflexes normal.  Psychiatric:        Mood and Affect: Mood normal.           Assessment & Plan:   Problem List Items Addressed This Visit      Cardiovascular and Mediastinum   Essential hypertension    Well controlled without signs of orthostasis  BP: 124/70          Other   Chest wall pain    Suspect costochondritis based on hx and EKG cxr today to r/o infiltrate or other cause  Reassuring exam Handout given  Heat/ice prn and tylenol  Enc her to take good breaths to  reduce risk of pneumonia        Relevant Orders   DG Chest 2 View (Completed)    Other Visit Diagnoses    Chest pain, unspecified type    -  Primary   Relevant Orders   EKG 12-Lead (Completed)

## 2019-04-23 NOTE — Assessment & Plan Note (Signed)
Well controlled without signs of orthostasis  BP: 124/70

## 2019-04-23 NOTE — Patient Instructions (Signed)
I think you have some chest wall pain - likely from costochondritis (inflammation of chest wall)  Take a look at the handout  If needed take tylenol Heat or ice also helps   If you develop cough/fever/shortness of breath/ loss of taste or smell please let us know so we can order a covid test

## 2019-04-23 NOTE — Assessment & Plan Note (Signed)
Suspect costochondritis based on hx and EKG cxr today to r/o infiltrate or other cause  Reassuring exam Handout given  Heat/ice prn and tylenol  Enc her to take good breaths to reduce risk of pneumonia

## 2019-05-26 ENCOUNTER — Other Ambulatory Visit: Payer: Self-pay | Admitting: Family Medicine

## 2019-05-26 DIAGNOSIS — Z1231 Encounter for screening mammogram for malignant neoplasm of breast: Secondary | ICD-10-CM

## 2019-06-08 ENCOUNTER — Ambulatory Visit (INDEPENDENT_AMBULATORY_CARE_PROVIDER_SITE_OTHER): Payer: Medicare Other | Admitting: Psychiatry

## 2019-06-08 ENCOUNTER — Encounter: Payer: Self-pay | Admitting: Psychiatry

## 2019-06-08 ENCOUNTER — Other Ambulatory Visit: Payer: Self-pay

## 2019-06-08 DIAGNOSIS — F411 Generalized anxiety disorder: Secondary | ICD-10-CM

## 2019-06-08 DIAGNOSIS — F329 Major depressive disorder, single episode, unspecified: Secondary | ICD-10-CM

## 2019-06-08 DIAGNOSIS — F419 Anxiety disorder, unspecified: Secondary | ICD-10-CM

## 2019-06-08 DIAGNOSIS — F331 Major depressive disorder, recurrent, moderate: Secondary | ICD-10-CM

## 2019-06-08 MED ORDER — SERTRALINE HCL 50 MG PO TABS: 75.0000 mg | ORAL_TABLET | Freq: Every day | ORAL | 1 refills | Status: DC

## 2019-06-08 NOTE — Progress Notes (Signed)
Patient ID: [name], female   DOB: [birth year], 73 y.o.   MRN: [id number]   Patient is a 73 year old female with history of anxiety and depression who was followed up for medication management. She reported that she has been compliant with her medications and has been taking them as prescribed. She is tired of staying at home due to [facility] and reported that it is getting too long for her. She has not been going out and has been staying mostly at home. She reported that she sleeps well at night and has been taking alprazolam on a regular basis. She denied having any memory problems related to the medication. No other acute issues noted at this time.  Plan I will refilled her medications as prescribed. Call in the refill of Xanax to the pharmacy. She will follow-up in six months earlier depending on her symptoms   I connected with patient via telemedicine application and verified that I am speaking with the correct person using two identifiers.  I discussed the limitations of evaluation and management by telemedicine and the availability of in person appointments. The patient expressed understanding and agreed to proceed.   I discussed the assessment and treatment plan with the patient. The patient was provided an opportunity to ask questions and all were answered. The patient agreed with the plan and demonstrated an understanding of the instructions.   The patient was advised to call back or seek an in-person evaluation if the symptoms worsen or if the condition fails to improve as anticipated.   I provided 15 minutes of non-face-to-face time during this encounter.

## 2019-06-22 ENCOUNTER — Ambulatory Visit: Payer: Medicare Other | Admitting: Psychiatry

## 2019-06-30 ENCOUNTER — Encounter: Payer: Self-pay | Admitting: Family Medicine

## 2019-06-30 ENCOUNTER — Other Ambulatory Visit: Payer: Self-pay

## 2019-06-30 ENCOUNTER — Ambulatory Visit (INDEPENDENT_AMBULATORY_CARE_PROVIDER_SITE_OTHER): Payer: Medicare Other | Admitting: Family Medicine

## 2019-06-30 VITALS — BP 122/74 | HR 80 | Temp 97.6°F | Ht 63.75 in | Wt 175.4 lb

## 2019-06-30 DIAGNOSIS — F320 Major depressive disorder, single episode, mild: Secondary | ICD-10-CM

## 2019-06-30 DIAGNOSIS — E669 Obesity, unspecified: Secondary | ICD-10-CM | POA: Diagnosis not present

## 2019-06-30 DIAGNOSIS — E039 Hypothyroidism, unspecified: Secondary | ICD-10-CM

## 2019-06-30 DIAGNOSIS — F32A Depression, unspecified: Secondary | ICD-10-CM

## 2019-06-30 DIAGNOSIS — F411 Generalized anxiety disorder: Secondary | ICD-10-CM

## 2019-06-30 MED ORDER — SERTRALINE HCL 100 MG PO TABS: 100.0000 mg | ORAL_TABLET | Freq: Every day | ORAL | 3 refills | Status: DC

## 2019-06-30 MED ORDER — LEVOTHYROXINE SODIUM 25 MCG PO TABS: 25.0000 ug | ORAL_TABLET | Freq: Every day | ORAL | 3 refills | Status: DC

## 2019-06-30 NOTE — Progress Notes (Signed)
Subjective:    Patient ID: [name], female    DOB: 07/22/1946, 73 y.o.   MRN: [id number]  HPI Pt presents to discuss thyroid issues and anxiety   Wt Readings from Last 3 Encounters:  06/30/19 175 lb 6 oz (79.5 kg)  04/23/19 173 lb 7 oz (78.7 kg)  04/14/19 172 lb 5 oz (78.2 kg)  wt is up a bit  30.34 kg/m   H/o subclinical hypothyroidism Lab Results  Component Value Date   TSH 7.39 (H) 04/03/2019   she is now feeling more tired  More difficulty loosing weight  occ muscle and joint stiffness/ pain  Dry skin  Brain fog occasionally  Cold intol at times   Has done counseling several times in the past-not very helpful  FT4 of 0.74   PGM had a goiter    Sees psychiatry zoloft and xanax  Sees Dr Gretel Acre -left practice early this month   Anxiety is now getting worse  World events Son worked in [place] (job issues) -she worries about him   Gets episodes of tingling in arms /chest burning  Thinks she may need to go up to 100 mg in zoloft  Had been stable for a long time   She takes xanax 0.25 at bedtime  Used to be on time released -no longer needs that  Has trouble sleeping  Depression is well controlled -no problems  Melatonin helps also   Exercise - she walks the dog  Water aerobics bothered her back   Drinks one cup of coffee in am    Patient Active Problem List   Diagnosis Date Noted  . Chest wall pain 04/23/2019  . Osteopenia 02/13/2016  . Routine general medical examination at a health care facility 01/23/2016  . Estrogen deficiency 01/23/2016  . Hypothyroid 01/23/2016  . Mild depression ([place]) 02/21/2015  . H/O diabetes mellitus 01/04/2015  . H/O arthritis 01/04/2015  . History of hay fever 01/04/2015  . Encounter for Medicare annual wellness exam 01/14/2013  . Obesity (BMI 30-39.9) 01/15/2012  . MENOPAUSAL SYNDROME 10/04/2008  . Prediabetes 12/02/2007  . TREMOR 12/02/2007  . DEPRESSION 08/04/2007  . Hyperlipidemia 01/28/2007  .  Generalized anxiety disorder 01/28/2007  . Essential hypertension 01/28/2007  . GERD 01/28/2007  . URINARY INCONTINENCE 01/28/2007  . COLONIC POLYPS, HX OF 01/28/2007  . HERPES ZOSTER 01/20/2007  . DYSPHONIA 01/20/2007  . COUGH, CHRONIC 01/20/2007  . ALLERGY 01/20/2007   Past Medical History:  Diagnosis Date  . Allergic rhinitis    gets allergy shot from Dr. Carlis Abbott  . Anxiety   . Arthritis   . Diabetes mellitus without complication (HCC)    diet controlled  . GERD (gastroesophageal reflux disease)   . History of shingles    at age 89- mild  . Hx of colonic polyp   . Hyperglycemia   . Hyperlipemia    Very high chol intol of statins-does not want to check it  . Hypertension   . Panic disorder   . Tremor    from anx  . Urinary incontinence    mixed  . Voice disorder    Past Surgical History:  Procedure Laterality Date  . ABDOMINAL HYSTERECTOMY    . BLADDER SUSPENSION    . COLONOSCOPY WITH PROPOFOL N/A 12/24/2016   Procedure: COLONOSCOPY WITH PROPOFOL;  Surgeon: Manya Silvas, MD;  Location: [facility] ENDOSCOPY;  Service: Endoscopy;  Laterality: N/A;  . ESOPHAGOGASTRODUODENOSCOPY (EGD) WITH PROPOFOL N/A 12/24/2016   Procedure: ESOPHAGOGASTRODUODENOSCOPY (EGD) WITH  PROPOFOL;  Surgeon: Manya Silvas, MD;  Location[facility] ENDOSCOPY;  Service: Endoscopy;  Laterality: N/A;  . EYE SURGERY Bilateral 2000   eye lens replacement  . HAND SURGERY Left 2016   left long trigger finger release  . JOINT REPLACEMENT     knee replacement - march 15  . KNEE ARTHROSCOPY Left 04/26/2016   Procedure: ARTHROSCOPY KNEE;  Surgeon: Hessie Knows, MD;  Location: [facility] ORS;  Service: Orthopedics;  Laterality: Left;  Marland Kitchen MEDIAL PARTIAL KNEE REPLACEMENT Left 2016  . NASAL SINUS SURGERY    . PARTIAL HYSTERECTOMY     with fibroid  . RECTOCELE REPAIR    . SYNOVECTOMY Left 04/26/2016   Procedure: PARTIAL SYNOVECTOMY;  Surgeon: Hessie Knows, MD;  Location: [facility] ORS;  Service: Orthopedics;  Laterality: Left;   . TONSILLECTOMY     Social History   Tobacco Use  . Smoking status: Never Smoker  . Smokeless tobacco: Never Used  Substance Use Topics  . Alcohol use: No    Alcohol/week: 0.0 standard drinks  . Drug use: No   Family History  Problem Relation Age of Onset  . Diabetes Mother   . Hyperlipidemia Mother   . Hypertension Mother   . Osteoarthritis Mother   . Cancer Father        Colon  . Leukemia Sister   . Syncope episode Daughter    Allergies  Allergen Reactions  . Atorvastatin     REACTION: shoulder pain  . Ezetimibe     REACTION: ?  . Lisinopril     REACTION: muscle pain  . Molds & Smuts   . Penicillins     REACTION: reaction not known Has patient had a PCN reaction causing immediate rash, facial/tongue/throat swelling, SOB or lightheadedness with hypotension: unknown Has patient had a PCN reaction causing severe rash involving mucus membranes or skin necrosis: unknown Has patient had a PCN reaction that required hospitalization unsure Has patient had a PCN reaction occurring within the last 10 years: no If all of the above answers are "NO", then may proceed with Cephalosporin use.  . Pneumococcal Vaccine Swelling    REACTION: severe local reaction  . Pneumococcal Vaccine Polyvalent     REACTION: severe local reaction  . Pollen Extract   . Rosuvastatin     REACTION: leg pain  . Simvastatin     REACTION: muscle pain   Current Outpatient Medications on File Prior to Visit  Medication Sig Dispense Refill  . ALPRAZolam (XANAX) 0.25 MG tablet Take 1 tablet (0.25 mg total) by mouth at bedtime as needed for anxiety. 90 tablet 1  . Azelastine HCl (ASTEPRO) 0.15 % SOLN 2 sprays by Nasal route 2 (two) times daily as needed. 30 mL 11  . Calcium Carbonate-Vitamin D (CALCIUM 600+D PO) Take 1 tablet by mouth 2 (two) times daily.     Marland Kitchen EPINEPHrine 0.3 mg/0.3 mL IJ SOAJ injection Inject 0.3 mg into the skin once. as needed.    Marland Kitchen levocetirizine (XYZAL) 5 MG tablet Take 1 tablet  by mouth daily.    . Melatonin 5 MG TABS Take 10 mg by mouth at bedtime as needed.    . Multiple Vitamin (MULTIVITAMIN) tablet Take 1 tablet by mouth daily.      . Omega-3 Fatty Acids (FISH OIL) 1200 MG CAPS Take 1 capsule by mouth daily.     Marland Kitchen omeprazole (PRILOSEC) 20 MG capsule TAKE 2 CAPSULES EVERY MORNING AND ONE EACH IN THE EVENING 270 capsule 3  .  ramipril (ALTACE) 10 MG capsule TAKE 1 CAPSULE BY MOUTH EVERY DAY 90 capsule 3  . triamterene-hydrochlorothiazide (MAXZIDE-25) 37.5-25 MG tablet Take 0.5 tablets by mouth daily. 45 tablet 3   No current facility-administered medications on file prior to visit.     Review of Systems  Constitutional: Positive for fatigue. Negative for activity change, appetite change, fever and unexpected weight change.  HENT: Negative for congestion, ear pain, rhinorrhea, sinus pressure and sore throat.   Eyes: Negative for pain, redness and visual disturbance.  Respiratory: Negative for cough, shortness of breath and wheezing.   Cardiovascular: Negative for chest pain and palpitations.  Gastrointestinal: Negative for abdominal pain, blood in stool, constipation and diarrhea.  Endocrine: Negative for polydipsia and polyuria.  Genitourinary: Negative for dysuria, frequency and urgency.  Musculoskeletal: Negative for arthralgias, back pain and myalgias.  Skin: Negative for pallor and rash.       Skin is dry  Allergic/Immunologic: Negative for environmental allergies.  Neurological: Negative for dizziness, syncope and headaches.  Hematological: Negative for adenopathy. Does not bruise/bleed easily.  Psychiatric/Behavioral: Positive for dysphoric mood and sleep disturbance. Negative for agitation, decreased concentration, hallucinations and self-injury. The patient is nervous/anxious.        Objective:   Physical Exam Constitutional:      General: She is not in acute distress.    Appearance: Normal appearance. She is well-developed. She is obese. She is  not ill-appearing or diaphoretic.  HENT:     Head: Normocephalic and atraumatic.     Mouth/Throat:     Mouth: Mucous membranes are moist.     Comments: Dysphonia is worse today, possibly due to anxiety Eyes:     General: No scleral icterus.    Conjunctiva/sclera: Conjunctivae normal.     Pupils: Pupils are equal, round, and reactive to light.  Neck:     Musculoskeletal: Normal range of motion and neck supple.     Thyroid: No thyromegaly.     Vascular: No carotid bruit or JVD.     Comments: No goiter Cardiovascular:     Rate and Rhythm: Normal rate and regular rhythm.     Heart sounds: Normal heart sounds. No gallop.   Pulmonary:     Effort: Pulmonary effort is normal. No respiratory distress.     Breath sounds: Normal breath sounds. No stridor. No wheezing or rales.  Abdominal:     General: Bowel sounds are normal. There is no distension or abdominal bruit.     Palpations: Abdomen is soft. There is no mass.     Tenderness: There is no abdominal tenderness.  Musculoskeletal:     Right lower leg: No edema.     Left lower leg: No edema.  Lymphadenopathy:     Cervical: No cervical adenopathy.  Skin:    General: Skin is warm and dry.     Findings: No rash.  Neurological:     Mental Status: She is alert.     Motor: No weakness.     Gait: Gait normal.     Deep Tendon Reflexes: Reflexes are normal and symmetric. Reflexes normal.     Comments: Mild hand tremor    Psychiatric:        Mood and Affect: Mood is anxious. Mood is not depressed.        Behavior: Behavior normal.        Thought Content: Thought content normal.        Cognition and Memory: Cognition and memory normal.  Assessment & Plan:   Problem List Items Addressed This Visit      Endocrine   Hypothyroid    Pt is now symptomatic Will start levothyroxine 25 mcg daily in am at least 30 min before food and several hours away from antacids and supplements  Re check TSH in 6 mo      Relevant  Medications   levothyroxine (SYNTHROID) 25 MCG tablet     Other   Generalized anxiety disorder - Primary    Worse lately -ironically just as her psychiatrist left practice  Reviewed stressors/ coping techniques/symptoms/ support sources/ tx options and side effects in detail today Has new stressors Plan to increase sertraline to 100 mg daily  She continues xanax 0.25 qhs  (disc risk for sedation/dependence/memory loss)  If symptoms do not improve we will need to find a new psychiatrist      Relevant Medications   sertraline (ZOLOFT) 100 MG tablet   Obesity (BMI 30-39.9)    Discussed how this problem influences overall health and the risks it imposes  Reviewed plan for weight loss with lower calorie diet (via better food choices and also portion control or program like weight watchers) and exercise building up to or more than 30 minutes 5 days per week including some aerobic activity   Exercise would also help her anxiety      Mild depression ([place])    Controlled with simvastatin  Pt's psychiatrist left practice and she is changing px to here      Relevant Medications   sertraline (ZOLOFT) 100 MG tablet

## 2019-06-30 NOTE — Patient Instructions (Addendum)
Exercise is very important to mental health  Try to increase walking to 30 minutes per day   Cut back on coffee if you can   Also chair exercise programs (there are several available) I like chair yoga   For mild hypothyroidism take 25 mcg of levothyroxine in am at least 30 minutes before eating and several hours before vitamins/supplements   Increase you zoloft to 100 mg once daily

## 2019-06-30 NOTE — Assessment & Plan Note (Signed)
Controlled with simvastatin  Pt's psychiatrist left practice and she is changing px to here

## 2019-06-30 NOTE — Assessment & Plan Note (Signed)
Worse lately -ironically just as her psychiatrist left practice  Reviewed stressors/ coping techniques/symptoms/ support sources/ tx options and side effects in detail today Has new stressors Plan to increase sertraline to 100 mg daily  She continues xanax 0.25 qhs  (disc risk for sedation/dependence/memory loss)  If symptoms do not improve we will need to find a new psychiatrist

## 2019-06-30 NOTE — Assessment & Plan Note (Signed)
Pt is now symptomatic Will start levothyroxine 25 mcg daily in am at least 30 min before food and several hours away from antacids and supplements  Re check TSH in 6 mo

## 2019-06-30 NOTE — Assessment & Plan Note (Signed)
Discussed how this problem influences overall health and the risks it imposes  Reviewed plan for weight loss with lower calorie diet (via better food choices and also portion control or program like weight watchers) and exercise building up to or more than 30 minutes 5 days per week including some aerobic activity   Exercise would also help her anxiety

## 2019-07-01 ENCOUNTER — Ambulatory Visit
Admission: RE | Admit: 2019-07-01 | Discharge: 2019-07-01 | Disposition: A | Discharge status: Home / Self Care | Payer: Medicare Other | Source: Ambulatory Visit | Attending: Family Medicine | Admitting: Family Medicine

## 2019-07-01 DIAGNOSIS — Z1231 Encounter for screening mammogram for malignant neoplasm of breast: Secondary | ICD-10-CM | POA: Insufficient documentation

## 2019-08-11 ENCOUNTER — Other Ambulatory Visit (INDEPENDENT_AMBULATORY_CARE_PROVIDER_SITE_OTHER): Payer: Medicare Other

## 2019-08-11 DIAGNOSIS — E039 Hypothyroidism, unspecified: Secondary | ICD-10-CM

## 2019-08-11 LAB — TSH: TSH: 4.85 u[IU]/mL — ABNORMAL HIGH (ref 0.35–4.50)

## 2019-08-17 ENCOUNTER — Other Ambulatory Visit: Payer: Self-pay

## 2019-08-17 ENCOUNTER — Telehealth: Payer: Self-pay | Admitting: Family Medicine

## 2019-08-17 MED ORDER — LEVOTHYROXINE SODIUM 50 MCG PO TABS: 50.0000 ug | ORAL_TABLET | Freq: Every day | ORAL | 0 refills | Status: DC

## 2019-08-17 NOTE — Telephone Encounter (Signed)
Patient stated that a prescription was being sent to her pharmacy for levothyroxine. She would like to see if this can be sent for a 90 day supply

## 2019-08-17 NOTE — Telephone Encounter (Signed)
Rx was sent in for 90-day supply.

## 2019-09-14 ENCOUNTER — Encounter: Payer: Self-pay | Admitting: Family Medicine

## 2019-09-23 ENCOUNTER — Telehealth: Payer: Self-pay | Admitting: *Deleted

## 2019-09-23 NOTE — Telephone Encounter (Signed)
PA done at www.covermymeds.com for the quantity of omeprazole, will await a response

## 2019-09-24 NOTE — Telephone Encounter (Signed)
PA was approved. Letter sent to pharmacy to notify them. Letter placed in Dr. Marliss Coots inbox to sign and send for scanning

## 2019-09-29 ENCOUNTER — Encounter: Payer: Self-pay | Admitting: Family Medicine

## 2019-09-29 ENCOUNTER — Other Ambulatory Visit: Payer: Self-pay

## 2019-09-29 ENCOUNTER — Ambulatory Visit: Payer: Medicare PPO | Admitting: Family Medicine

## 2019-09-29 VITALS — BP 120/74 | HR 73 | Temp 97.2°F | Ht 63.75 in | Wt 178.3 lb

## 2019-09-29 DIAGNOSIS — F320 Major depressive disorder, single episode, mild: Secondary | ICD-10-CM | POA: Diagnosis not present

## 2019-09-29 DIAGNOSIS — F411 Generalized anxiety disorder: Secondary | ICD-10-CM | POA: Diagnosis not present

## 2019-09-29 DIAGNOSIS — E039 Hypothyroidism, unspecified: Secondary | ICD-10-CM | POA: Diagnosis not present

## 2019-09-29 DIAGNOSIS — E669 Obesity, unspecified: Secondary | ICD-10-CM

## 2019-09-29 DIAGNOSIS — F32A Depression, unspecified: Secondary | ICD-10-CM

## 2019-09-29 LAB — TSH: TSH: 2.07 u[IU]/mL (ref 0.35–4.50)

## 2019-09-29 NOTE — Assessment & Plan Note (Signed)
Discussed how this problem influences overall health and the risks it imposes  Reviewed plan for weight loss with lower calorie diet (via better food choices and also portion control or program like weight watchers) and exercise building up to or more than 30 minutes 5 days per week including some aerobic activity   Enc exercise since this helps mood as well

## 2019-09-29 NOTE — Progress Notes (Signed)
Subjective:    Patient ID: [name], female    DOB: [birth year], 74 y.o.   MRN: [id number]  COVID-19 Labs  This visit occurred during the SARS-CoV-2 public health emergency.  Safety protocols were in place, including screening questions prior to the visit, additional usage of staff PPE, and extensive cleaning of exam room while observing appropriate contact time as indicated for disinfecting solutions.    HPI Pt presents for f/u of chronic medical issues   Wt Readings from Last 3 Encounters:  09/29/19 178 lb 5 oz (80.9 kg)  06/30/19 175 lb 6 oz (79.5 kg)  04/23/19 173 lb 7 oz (78.7 kg)   30.85 kg/m   Last visit started levothyroxine 25 mcg for hypothyroidism Then inc to 50 mcg  Lab Results  Component Value Date   TSH 4.85 (H) 08/11/2019   she feel like she has a bit more energy  Needs TSH today Not too hyper   Generalized anxiety disorder  Last visit noted it worse with new stressors  Increased sertraline to 100 mg daily  This has made a big difference - much better  Less anxious  Stress level is about the same- she can deal with it better   She takes xanax 0.25 mg xanax at bedtime (sleeps well most of the time and then occ a bad night)  Added melatonin -and it helps Her depression was stable   Got 90 day supply 07/07/19 - xanax  She has about a week left of medicine  Now she can only get 30 [facility] time [facility])    Patient Active Problem List   Diagnosis Date Noted  . Chest wall pain 04/23/2019  . Osteopenia 02/13/2016  . Routine general medical examination at a health care facility 01/23/2016  . Estrogen deficiency 01/23/2016  . Hypothyroid 01/23/2016  . Mild depression ([place]) 02/21/2015  . H/O diabetes mellitus 01/04/2015  . H/O arthritis 01/04/2015  . History of hay fever 01/04/2015  . Encounter for Medicare annual wellness exam 01/14/2013  . Obesity (BMI 30-39.9) 01/15/2012  . MENOPAUSAL SYNDROME 10/04/2008  . Prediabetes 12/02/2007  . TREMOR  12/02/2007  . Hyperlipidemia 01/28/2007  . Generalized anxiety disorder 01/28/2007  . Essential hypertension 01/28/2007  . GERD 01/28/2007  . URINARY INCONTINENCE 01/28/2007  . COLONIC POLYPS, HX OF 01/28/2007  . HERPES ZOSTER 01/20/2007  . DYSPHONIA 01/20/2007  . COUGH, CHRONIC 01/20/2007  . ALLERGY 01/20/2007   Past Medical History:  Diagnosis Date  . Allergic rhinitis    gets allergy shot from Dr. Carlis Abbott  . Anxiety   . Arthritis   . Diabetes mellitus without complication (HCC)    diet controlled  . GERD (gastroesophageal reflux disease)   . History of shingles    at age 8- mild  . Hx of colonic polyp   . Hyperglycemia   . Hyperlipemia    Very high chol intol of statins-does not want to check it  . Hypertension   . Panic disorder   . Tremor    from anx  . Urinary incontinence    mixed  . Voice disorder    Past Surgical History:  Procedure Laterality Date  . ABDOMINAL HYSTERECTOMY    . BLADDER SUSPENSION    . COLONOSCOPY WITH PROPOFOL N/A 12/24/2016   Procedure: COLONOSCOPY WITH PROPOFOL;  Surgeon: Manya Silvas, MD;  Location[facility] ENDOSCOPY;  Service: Endoscopy;  Laterality: N/A;  . ESOPHAGOGASTRODUODENOSCOPY (EGD) WITH PROPOFOL N/A 12/24/2016   Procedure: ESOPHAGOGASTRODUODENOSCOPY (EGD) WITH  PROPOFOL;  Surgeon: Manya Silvas, MD;  Location: [facility] ENDOSCOPY;  Service: Endoscopy;  Laterality: N/A;  . EYE SURGERY Bilateral 2000   eye lens replacement  . HAND SURGERY Left 2016   left long trigger finger release  . JOINT REPLACEMENT     knee replacement - march 15  . KNEE ARTHROSCOPY Left 04/26/2016   Procedure: ARTHROSCOPY KNEE;  Surgeon: Hessie Knows, MD;  Location: [facility] ORS;  Service: Orthopedics;  Laterality: Left;  Marland Kitchen MEDIAL PARTIAL KNEE REPLACEMENT Left 2016  . NASAL SINUS SURGERY    . PARTIAL HYSTERECTOMY     with fibroid  . RECTOCELE REPAIR    . SYNOVECTOMY Left 04/26/2016   Procedure: PARTIAL SYNOVECTOMY;  Surgeon: Hessie Knows, MD;  Location: [facility] ORS;   Service: Orthopedics;  Laterality: Left;  . TONSILLECTOMY     Social History   Tobacco Use  . Smoking status: Never Smoker  . Smokeless tobacco: Never Used  Substance Use Topics  . Alcohol use: No    Alcohol/week: 0.0 standard drinks  . Drug use: No   Family History  Problem Relation Age of Onset  . Diabetes Mother   . Hyperlipidemia Mother   . Hypertension Mother   . Osteoarthritis Mother   . Cancer Father        Colon  . Leukemia Sister   . Syncope episode Daughter    Allergies  Allergen Reactions  . Atorvastatin     REACTION: shoulder pain  . Ezetimibe     REACTION: ?  . Lisinopril     REACTION: muscle pain  . Molds & Smuts   . Penicillins     REACTION: reaction not known Has patient had a PCN reaction causing immediate rash, facial/tongue/throat swelling, SOB or lightheadedness with hypotension: unknown Has patient had a PCN reaction causing severe rash involving mucus membranes or skin necrosis: unknown Has patient had a PCN reaction that required hospitalization unsure Has patient had a PCN reaction occurring within the last 10 years: no If all of the above answers are "NO", then may proceed with Cephalosporin use.  . Pneumococcal Vaccine Swelling    REACTION: severe local reaction  . Pneumococcal Vaccine Polyvalent     REACTION: severe local reaction  . Pollen Extract   . Rosuvastatin     REACTION: leg pain  . Simvastatin     REACTION: muscle pain   Current Outpatient Medications on File Prior to Visit  Medication Sig Dispense Refill  . ALPRAZolam (XANAX) 0.25 MG tablet Take 1 tablet (0.25 mg total) by mouth at bedtime as needed for anxiety. 90 tablet 1  . Azelastine HCl (ASTEPRO) 0.15 % SOLN 2 sprays by Nasal route 2 (two) times daily as needed. 30 mL 11  . Calcium Carbonate-Vitamin D (CALCIUM 600+D PO) Take 1 tablet by mouth 2 (two) times daily.     Marland Kitchen EPINEPHrine 0.3 mg/0.3 mL IJ SOAJ injection Inject 0.3 mg into the skin once. as needed.    Marland Kitchen  levocetirizine (XYZAL) 5 MG tablet Take 1 tablet by mouth daily.    Marland Kitchen levothyroxine (SYNTHROID) 50 MCG tablet Take 1 tablet (50 mcg total) by mouth daily before breakfast. 90 tablet 0  . Melatonin 5 MG TABS Take 10 mg by mouth at bedtime as needed.    . Multiple Vitamin (MULTIVITAMIN) tablet Take 1 tablet by mouth daily.      . Omega-3 Fatty Acids (FISH OIL) 1200 MG CAPS Take 1 capsule by mouth daily.     Marland Kitchen  omeprazole (PRILOSEC) 20 MG capsule TAKE 2 CAPSULES EVERY MORNING AND ONE EACH IN THE EVENING 270 capsule 3  . ramipril (ALTACE) 10 MG capsule TAKE 1 CAPSULE BY MOUTH EVERY DAY 90 capsule 3  . sertraline (ZOLOFT) 100 MG tablet Take 1 tablet (100 mg total) by mouth daily. 30 tablet 3  . triamterene-hydrochlorothiazide (MAXZIDE-25) 37.5-25 MG tablet Take 0.5 tablets by mouth daily. 45 tablet 3   No current facility-administered medications on file prior to visit.    Review of Systems  Constitutional: Negative for activity change, appetite change, fatigue, fever and unexpected weight change.  HENT: Negative for congestion, ear pain, rhinorrhea, sinus pressure and sore throat.   Eyes: Negative for pain, redness and visual disturbance.  Respiratory: Negative for cough, shortness of breath and wheezing.   Cardiovascular: Negative for chest pain and palpitations.  Gastrointestinal: Negative for abdominal pain, blood in stool, constipation and diarrhea.  Endocrine: Negative for polydipsia and polyuria.  Genitourinary: Negative for dysuria, frequency and urgency.  Musculoskeletal: Negative for arthralgias, back pain and myalgias.  Skin: Negative for pallor and rash.  Allergic/Immunologic: Negative for environmental allergies.  Neurological: Negative for dizziness, syncope and headaches.  Hematological: Negative for adenopathy. Does not bruise/bleed easily.  Psychiatric/Behavioral: Positive for sleep disturbance. Negative for decreased concentration and dysphoric mood. The patient is  nervous/anxious.        Anxiety is overall much improved       Objective:   Physical Exam Constitutional:      General: She is not in acute distress.    Appearance: Normal appearance. She is well-developed. She is obese. She is not ill-appearing or diaphoretic.  HENT:     Head: Normocephalic and atraumatic.     Mouth/Throat:     Mouth: Mucous membranes are moist.  Eyes:     General: No scleral icterus.    Conjunctiva/sclera: Conjunctivae normal.     Pupils: Pupils are equal, round, and reactive to light.  Neck:     Thyroid: No thyromegaly.     Vascular: No carotid bruit or JVD.     Comments: No thyromegally Cardiovascular:     Rate and Rhythm: Normal rate and regular rhythm.     Heart sounds: Normal heart sounds. No gallop.   Pulmonary:     Effort: Pulmonary effort is normal. No respiratory distress.     Breath sounds: Normal breath sounds. No wheezing or rales.  Abdominal:     General: There is no abdominal bruit.  Musculoskeletal:     Cervical back: Normal range of motion and neck supple.     Right lower leg: No edema.     Left lower leg: No edema.  Lymphadenopathy:     Cervical: No cervical adenopathy.  Skin:    General: Skin is warm and dry.     Findings: No erythema or rash.  Neurological:     Mental Status: She is alert.     Motor: No tremor.     Coordination: Coordination normal.     Deep Tendon Reflexes: Reflexes are normal and symmetric. Reflexes normal.  Psychiatric:        Mood and Affect: Mood normal.        Cognition and Memory: Cognition and memory normal.     Comments: Good mood Speaks candidly about her anxiety issues            Assessment & Plan:   Problem List Items Addressed This Visit      Endocrine   Hypothyroid  Pt feels better with levothyroxine 50 mcg daily  Taking appropriately  TSH today      Relevant Orders   TSH     Other   Generalized anxiety disorder - Primary    Anxiety with mild depression  Much improved after  inc her sertraline to 100 mg  No side eff Reviewed stressors/ coping techniques/symptoms/ support sources/ tx options and side effects in detail today Stressors are about the same Does not desire counseling Continues to take xanax 0.25 mg at bedtime (her psychiatrist no longer practices so this comes to me) Has about a week left and then will call for px       Obesity (BMI 30-39.9)    Discussed how this problem influences overall health and the risks it imposes  Reviewed plan for weight loss with lower calorie diet (via better food choices and also portion control or program like weight watchers) and exercise building up to or more than 30 minutes 5 days per week including some aerobic activity   Enc exercise since this helps mood as well      Mild depression ([place])    Stable/well controlled with sertraline

## 2019-09-29 NOTE — Assessment & Plan Note (Signed)
Pt feels better with levothyroxine 50 mcg daily  Taking appropriately  TSH today

## 2019-09-29 NOTE — Assessment & Plan Note (Signed)
Stable/well controlled with sertraline

## 2019-09-29 NOTE — Assessment & Plan Note (Signed)
Anxiety with mild depression  Much improved after inc her sertraline to 100 mg  No side eff Reviewed stressors/ coping techniques/symptoms/ support sources/ tx options and side effects in detail today Stressors are about the same Does not desire counseling Continues to take xanax 0.25 mg at bedtime (her psychiatrist no longer practices so this comes to me) Has about a week left and then will call for px

## 2019-09-29 NOTE — Patient Instructions (Addendum)
When you are down to 2-3 pills of xanax please call for [place] to refill it   We will check a thyroid test today   So glad you are doing better   Stay at the 100 mg of sertraline   Take care of yourself  Stay active and exercise  Get outdoor when weather permits

## 2019-09-30 ENCOUNTER — Other Ambulatory Visit: Payer: Self-pay | Admitting: *Deleted

## 2019-09-30 ENCOUNTER — Encounter: Payer: Self-pay | Admitting: *Deleted

## 2019-09-30 MED ORDER — LEVOTHYROXINE SODIUM 50 MCG PO TABS: 50.0000 ug | ORAL_TABLET | Freq: Every day | ORAL | 2 refills | Status: DC

## 2019-10-02 ENCOUNTER — Other Ambulatory Visit: Payer: Self-pay | Admitting: Family Medicine

## 2019-10-02 MED ORDER — ALPRAZOLAM 0.25 MG PO TABS: 0.2500 mg | ORAL_TABLET | Freq: Every evening | ORAL | 0 refills | Status: DC | PRN

## 2019-10-02 NOTE — Telephone Encounter (Signed)
Dr.Tower told patient she would start refilling patient's Xanax.  Patient said she has 3 pills left.  Patient uses [facility].

## 2019-10-02 NOTE — Telephone Encounter (Signed)
Name of Medication: Xanax 0.25 mg Name of [facility] Last Fill or Written Date and Quantity: # 90 x 1 on 01/12/19 by Dr Gretel Acre Last Office Visit and Type: 09/29/19 FU Next Office Visit and Type: 04/13/20 CPX Last Controlled Substance Agreement Date: none Last [id number]

## 2019-10-16 DIAGNOSIS — J301 Allergic rhinitis due to pollen: Secondary | ICD-10-CM | POA: Diagnosis not present

## 2019-10-19 DIAGNOSIS — J301 Allergic rhinitis due to pollen: Secondary | ICD-10-CM | POA: Diagnosis not present

## 2019-11-10 ENCOUNTER — Encounter: Payer: Self-pay | Admitting: Family Medicine

## 2019-11-30 ENCOUNTER — Other Ambulatory Visit: Payer: Self-pay | Admitting: Family Medicine

## 2019-12-07 ENCOUNTER — Ambulatory Visit: Payer: Medicare Other | Admitting: Psychiatry

## 2019-12-30 ENCOUNTER — Other Ambulatory Visit: Payer: Self-pay | Admitting: Family Medicine

## 2019-12-30 NOTE — Telephone Encounter (Signed)
Name of Medication: Xanax 0.25 mg Name of Pharmacy: [facility] or Written Date and Quantity:10/02/19 #90 with 0 refills Last Office Visit and Type: 09/29/19 FU Next Office Visit and Type: 04/13/20 CPX Last Controlled Substance Agreement Date: none Last [id number]

## 2020-01-26 DIAGNOSIS — J301 Allergic rhinitis due to pollen: Secondary | ICD-10-CM | POA: Diagnosis not present

## 2020-02-01 DIAGNOSIS — J301 Allergic rhinitis due to pollen: Secondary | ICD-10-CM | POA: Diagnosis not present

## 2020-02-13 ENCOUNTER — Other Ambulatory Visit: Payer: Self-pay | Admitting: Family Medicine

## 2020-02-29 ENCOUNTER — Other Ambulatory Visit: Payer: Self-pay | Admitting: Psychiatry

## 2020-03-31 ENCOUNTER — Other Ambulatory Visit: Payer: Self-pay | Admitting: Family Medicine

## 2020-03-31 NOTE — Telephone Encounter (Signed)
Name of Medication:Xanax 0.25 mg Name of [place] or Written Date and Quantity:12/30/19 #90 with 0 refills Last Office Visit and Type:09/29/19 FU Next Office Visit and Type:04/13/20 CPX Last Controlled Substance Agreement Date:none Last ZYY:QMGN

## 2020-04-02 ENCOUNTER — Other Ambulatory Visit: Payer: Self-pay | Admitting: Family Medicine

## 2020-04-07 ENCOUNTER — Other Ambulatory Visit (INDEPENDENT_AMBULATORY_CARE_PROVIDER_SITE_OTHER): Payer: Medicare PPO

## 2020-04-07 ENCOUNTER — Telehealth (INDEPENDENT_AMBULATORY_CARE_PROVIDER_SITE_OTHER): Payer: Medicare PPO | Admitting: Family Medicine

## 2020-04-07 ENCOUNTER — Other Ambulatory Visit: Payer: Self-pay

## 2020-04-07 ENCOUNTER — Ambulatory Visit: Payer: Medicare Other

## 2020-04-07 DIAGNOSIS — R7303 Prediabetes: Secondary | ICD-10-CM

## 2020-04-07 DIAGNOSIS — E039 Hypothyroidism, unspecified: Secondary | ICD-10-CM

## 2020-04-07 DIAGNOSIS — E7800 Pure hypercholesterolemia, unspecified: Secondary | ICD-10-CM | POA: Diagnosis not present

## 2020-04-07 DIAGNOSIS — I10 Essential (primary) hypertension: Secondary | ICD-10-CM

## 2020-04-07 LAB — LIPID PANEL
Cholesterol: 359 mg/dL — ABNORMAL HIGH (ref 0–200)
HDL: 46.5 mg/dL (ref >39.00)
LDL Cholesterol: 286 mg/dL — ABNORMAL HIGH (ref 0–99)
NonHDL: 312.76
Total CHOL/HDL Ratio: 8
Triglycerides: 132 mg/dL (ref 0.0–149.0)
VLDL: 26.4 mg/dL (ref 0.0–40.0)

## 2020-04-07 LAB — CBC WITH DIFFERENTIAL/PLATELET
Basophils Absolute: 0.1 10*3/uL (ref 0.0–0.1)
Basophils Relative: 0.9 % (ref 0.0–3.0)
Eosinophils Absolute: 0.2 10*3/uL (ref 0.0–0.7)
Eosinophils Relative: 2.5 % (ref 0.0–5.0)
HCT: 43.9 % (ref 36.0–46.0)
Hemoglobin: 15 g/dL (ref 12.0–15.0)
Lymphocytes Relative: 31.6 % (ref 12.0–46.0)
Lymphs Abs: 2.4 10*3/uL (ref 0.7–4.0)
MCHC: 34.1 g/dL (ref 30.0–36.0)
MCV: 86.7 fl (ref 78.0–100.0)
Monocytes Absolute: 0.7 10*3/uL (ref 0.1–1.0)
Monocytes Relative: 9.4 % (ref 3.0–12.0)
Neutro Abs: 4.1 10*3/uL (ref 1.4–7.7)
Neutrophils Relative %: 55.6 % (ref 43.0–77.0)
Platelets: 232 10*3/uL (ref 150.0–400.0)
RBC: 5.06 Mil/uL (ref 3.87–5.11)
RDW: 13.3 % (ref 11.5–15.5)
WBC: 7.4 10*3/uL (ref 4.0–10.5)

## 2020-04-07 LAB — TSH: TSH: 2.67 u[IU]/mL (ref 0.35–4.50)

## 2020-04-07 LAB — COMPREHENSIVE METABOLIC PANEL
ALT: 18 U/L (ref 0–35)
AST: 16 U/L (ref 0–37)
Albumin: 4.2 g/dL (ref 3.5–5.2)
Alkaline Phosphatase: 70 U/L (ref 39–117)
BUN: 24 mg/dL — ABNORMAL HIGH (ref 6–23)
CO2: 29 mEq/L (ref 19–32)
Calcium: 9.4 mg/dL (ref 8.4–10.5)
Chloride: 101 mEq/L (ref 96–112)
Creatinine, Ser: 1.08 mg/dL (ref 0.40–1.20)
GFR: 49.61 mL/min — ABNORMAL LOW (ref >60.00)
Glucose, Bld: 114 mg/dL — ABNORMAL HIGH (ref 70–99)
Potassium: 4.6 mEq/L (ref 3.5–5.1)
Sodium: 137 mEq/L (ref 135–145)
Total Bilirubin: 0.8 mg/dL (ref 0.2–1.2)
Total Protein: 6.4 g/dL (ref 6.0–8.3)

## 2020-04-07 LAB — HEMOGLOBIN A1C: Hgb A1c MFr Bld: 6.3 % (ref 4.6–6.5)

## 2020-04-07 NOTE — Telephone Encounter (Signed)
Lab order

## 2020-04-12 DIAGNOSIS — J301 Allergic rhinitis due to pollen: Secondary | ICD-10-CM | POA: Diagnosis not present

## 2020-04-12 DIAGNOSIS — R498 Other voice and resonance disorders: Secondary | ICD-10-CM | POA: Diagnosis not present

## 2020-04-12 DIAGNOSIS — R42 Dizziness and giddiness: Secondary | ICD-10-CM | POA: Diagnosis not present

## 2020-04-13 ENCOUNTER — Ambulatory Visit (INDEPENDENT_AMBULATORY_CARE_PROVIDER_SITE_OTHER): Payer: Medicare PPO

## 2020-04-13 ENCOUNTER — Other Ambulatory Visit: Payer: Self-pay

## 2020-04-13 ENCOUNTER — Encounter: Payer: Self-pay | Admitting: Family Medicine

## 2020-04-13 ENCOUNTER — Ambulatory Visit (INDEPENDENT_AMBULATORY_CARE_PROVIDER_SITE_OTHER): Payer: Medicare PPO | Admitting: Family Medicine

## 2020-04-13 VITALS — BP 126/72 | HR 84 | Temp 96.9°F | Ht 64.0 in | Wt 177.2 lb

## 2020-04-13 VITALS — BP 126/72 | HR 84 | Ht 64.0 in | Wt 177.2 lb

## 2020-04-13 DIAGNOSIS — F320 Major depressive disorder, single episode, mild: Secondary | ICD-10-CM

## 2020-04-13 DIAGNOSIS — Z0000 Encounter for general adult medical examination without abnormal findings: Secondary | ICD-10-CM

## 2020-04-13 DIAGNOSIS — E039 Hypothyroidism, unspecified: Secondary | ICD-10-CM

## 2020-04-13 DIAGNOSIS — M85859 Other specified disorders of bone density and structure, unspecified thigh: Secondary | ICD-10-CM

## 2020-04-13 DIAGNOSIS — E7800 Pure hypercholesterolemia, unspecified: Secondary | ICD-10-CM

## 2020-04-13 DIAGNOSIS — I10 Essential (primary) hypertension: Secondary | ICD-10-CM | POA: Diagnosis not present

## 2020-04-13 DIAGNOSIS — F411 Generalized anxiety disorder: Secondary | ICD-10-CM | POA: Diagnosis not present

## 2020-04-13 DIAGNOSIS — E669 Obesity, unspecified: Secondary | ICD-10-CM

## 2020-04-13 DIAGNOSIS — R7303 Prediabetes: Secondary | ICD-10-CM

## 2020-04-13 DIAGNOSIS — F32A Depression, unspecified: Secondary | ICD-10-CM

## 2020-04-13 MED ORDER — TRIAMTERENE-HCTZ 37.5-25 MG PO TABS: 0.5000 | ORAL_TABLET | Freq: Every day | ORAL | 3 refills | Status: DC

## 2020-04-13 MED ORDER — SERTRALINE HCL 100 MG PO TABS: 100.0000 mg | ORAL_TABLET | Freq: Every day | ORAL | 3 refills | Status: DC

## 2020-04-13 NOTE — Assessment & Plan Note (Signed)
Lab Results  Component Value Date   HGBA1C 6.3 04/07/2020   disc imp of low glycemic diet and wt loss to prevent DM2

## 2020-04-13 NOTE — Assessment & Plan Note (Signed)
Due for 2 y dexa in oct-she will call for order No falls or fx Taking ca and D Walking-encouraged more exercise as tolerated

## 2020-04-13 NOTE — Assessment & Plan Note (Signed)
bp in fair control at this time  BP Readings from Last 1 Encounters:  04/13/20 126/72   No changes needed Most recent labs reviewed  Disc lifstyle change with low sodium diet and exercise

## 2020-04-13 NOTE — Patient Instructions (Signed)
[name] , Thank you for taking time to come for your Medicare Wellness Visit. I appreciate your ongoing commitment to your health goals. Please review the following plan we discussed and let me know if I can assist you in the future.   Screening recommendations/referrals: Colonoscopy: Up to date, completed 12/24/2016, due 12/2026 Mammogram: Up to date, completed 07/01/2019, due 06/2020 Bone Density: Up to date, completed 06/26/2018, due 06/2020 Recommended yearly ophthalmology/optometry visit for glaucoma screening and checkup Recommended yearly dental visit for hygiene and checkup  Vaccinations: Influenza vaccine: Up to date, completed 06/25/2019, due 04/2020 Pneumococcal vaccine: allergic Tdap vaccine: decline-insurance/financial Shingles vaccine: Completed series   Covid-19:Completed series  Advanced directives: Please bring a copy of your POA (Power of Attorney) and/or Living Will to your next appointment.   Conditions/risks identified: hypertension, hyperlipidemia  Next appointment: Follow up in one year for your annual wellness visit    Preventive Care 43 Years and Older, Female Preventive care refers to lifestyle choices and visits with your health care provider that can promote health and wellness. What does preventive care include?  A yearly physical exam. This is also called an annual well check.  Dental exams once or twice a year.  Routine eye exams. Ask your health care provider how often you should have your eyes checked.  Personal lifestyle choices, including:  Daily care of your teeth and gums.  Regular physical activity.  Eating a healthy diet.  Avoiding tobacco and drug use.  Limiting alcohol use.  Practicing safe sex.  Taking low-dose aspirin every day.  Taking vitamin and mineral supplements as recommended by your health care provider. What happens during an annual well check? The services and screenings done by your health care provider during your  annual well check will depend on your age, overall health, lifestyle risk factors, and family history of disease. Counseling  Your health care provider may ask you questions about your:  Alcohol use.  Tobacco use.  Drug use.  Emotional well-being.  Home and relationship well-being.  Sexual activity.  Eating habits.  History of falls.  Memory and ability to understand (cognition).  Work and work Statistician.  Reproductive health. Screening  You may have the following tests or measurements:  Height, weight, and BMI.  Blood pressure.  Lipid and cholesterol levels. These may be checked every 5 years, or more frequently if you are over 27 years old.  Skin check.  Lung cancer screening. You may have this screening every year starting at age 32 if you have a 30-pack-year history of smoking and currently smoke or have quit within the past 15 years.  Fecal occult blood test (FOBT) of the stool. You may have this test every year starting at age 40.  Flexible sigmoidoscopy or colonoscopy. You may have a sigmoidoscopy every 5 years or a colonoscopy every 10 years starting at age 63.  Hepatitis C blood test.  Hepatitis B blood test.  Sexually transmitted disease (STD) testing.  Diabetes screening. This is done by checking your blood sugar (glucose) after you have not eaten for a while (fasting). You may have this done every 1-3 years.  Bone density scan. This is done to screen for osteoporosis. You may have this done starting at age 36.  Mammogram. This may be done every 1-2 years. Talk to your health care provider about how often you should have regular mammograms. Talk with your health care provider about your test results, treatment options, and if necessary, the need for more tests.  Vaccines  Your health care provider may recommend certain vaccines, such as:  Influenza vaccine. This is recommended every year.  Tetanus, diphtheria, and acellular pertussis (Tdap, Td)  vaccine. You may need a Td booster every 10 years.  Zoster vaccine. You may need this after age 53.  Pneumococcal 13-valent conjugate (PCV13) vaccine. One dose is recommended after age 19.  Pneumococcal polysaccharide (PPSV23) vaccine. One dose is recommended after age 40. Talk to your health care provider about which screenings and vaccines you need and how often you need them. This information is not intended to replace advice given to you by your health care provider. Make sure you discuss any questions you have with your health care provider. Document Released: 09/30/2015 Document Revised: 05/23/2016 Document Reviewed: 07/05/2015 Elsevier Interactive Patient Education  2017 [employer] Prevention in the Home Falls can cause injuries. They can happen to people of all ages. There are many things you can do to make your home safe and to help prevent falls. What can I do on the outside of my home?  Regularly fix the edges of walkways and driveways and fix any cracks.  Remove anything that might make you trip as you walk through a door, such as a raised step or threshold.  Trim any bushes or trees on the path to your home.  Use bright outdoor lighting.  Clear any walking paths of anything that might make someone trip, such as rocks or tools.  Regularly check to see if handrails are loose or broken. Make sure that both sides of any steps have handrails.  Any raised decks and porches should have guardrails on the edges.  Have any leaves, snow, or ice cleared regularly.  Use sand or salt on walking paths during winter.  Clean up any spills in your garage right away. This includes oil or grease spills. What can I do in the bathroom?  Use night lights.  Install grab bars by the toilet and in the tub and shower. Do not use towel bars as grab bars.  Use non-skid mats or decals in the tub or shower.  If you need to sit down in the shower, use a plastic, non-slip  stool.  Keep the floor dry. Clean up any water that spills on the floor as soon as it happens.  Remove soap buildup in the tub or shower regularly.  Attach bath mats securely with double-sided non-slip rug tape.  Do not have throw rugs and other things on the floor that can make you trip. What can I do in the bedroom?  Use night lights.  Make sure that you have a light by your bed that is easy to reach.  Do not use any sheets or blankets that are too big for your bed. They should not hang down onto the floor.  Have a firm chair that has side arms. You can use this for support while you get dressed.  Do not have throw rugs and other things on the floor that can make you trip. What can I do in the kitchen?  Clean up any spills right away.  Avoid walking on wet floors.  Keep items that you use a lot in easy-to-reach places.  If you need to reach something above you, use a strong step stool that has a grab bar.  Keep electrical cords out of the way.  Do not use floor polish or wax that makes floors slippery. If you must use wax, use non-skid floor wax.  Do not have throw rugs and other things on the floor that can make you trip. What can I do with my stairs?  Do not leave any items on the stairs.  Make sure that there are handrails on both sides of the stairs and use them. Fix handrails that are broken or loose. Make sure that handrails are as long as the stairways.  Check any carpeting to make sure that it is firmly attached to the stairs. Fix any carpet that is loose or worn.  Avoid having throw rugs at the top or bottom of the stairs. If you do have throw rugs, attach them to the floor with carpet tape.  Make sure that you have a light switch at the top of the stairs and the bottom of the stairs. If you do not have them, ask someone to add them for you. What else can I do to help prevent falls?  Wear shoes that:  Do not have high heels.  Have rubber bottoms.  Are  comfortable and fit you well.  Are closed at the toe. Do not wear sandals.  If you use a stepladder:  Make sure that it is fully opened. Do not climb a closed stepladder.  Make sure that both sides of the stepladder are locked into place.  Ask someone to hold it for you, if possible.  Clearly mark and make sure that you can see:  Any grab bars or handrails.  First and last steps.  Where the edge of each step is.  Use tools that help you move around (mobility aids) if they are needed. These include:  Canes.  Walkers.  Scooters.  Crutches.  Turn on the lights when you go into a dark area. Replace any light bulbs as soon as they burn out.  Set up your furniture so you have a clear path. Avoid moving your furniture around.  If any of your floors are uneven, fix them.  If there are any pets around you, be aware of where they are.  Review your medicines with your doctor. Some medicines can make you feel dizzy. This can increase your chance of falling. Ask your doctor what other things that you can do to help prevent falls. This information is not intended to replace advice given to you by your health care provider. Make sure you discuss any questions you have with your health care provider. Document Released: 06/30/2009 Document Revised: 02/09/2016 Document Reviewed: 10/08/2014 Elsevier Interactive Patient Education  2017 [employer].

## 2020-04-13 NOTE — Assessment & Plan Note (Signed)
Hypothyroidism  Pt has no clinical changes No change in energy level/ hair or skin/ edema and no tremor Lab Results  Component Value Date   TSH 2.67 04/07/2020

## 2020-04-13 NOTE — Progress Notes (Signed)
PCP notes:  Health Maintenance: No gaps noted    Abnormal Screenings: none   Patient concerns: none   Nurse concerns: none   Next PCP appt: none 

## 2020-04-13 NOTE — Assessment & Plan Note (Signed)
Clinically stable on sertraline No longer sees psychiatry

## 2020-04-13 NOTE — Assessment & Plan Note (Signed)
Reviewed health habits including diet and exercise and skin cancer prevention Reviewed appropriate screening tests for age  Also reviewed health mt list, fam hx and immunization status , as well as social and family history   See HPI Has amw visit later today  covid vaccinated and had shingrix also  dexa due in oct (no falls or fx)  Labs reviewed  Enc lower glycemic diet

## 2020-04-13 NOTE — Patient Instructions (Addendum)
dexa is due October or later   For diabetes prevention  Try to get most of your carbohydrates from produce (with the exception of white potatoes)  Eat less bread/pasta/rice/snack foods/cereals/sweets and other items from the middle of the grocery store (processed carbs)   For cholesterol Avoid red meat/ fried foods/ egg yolks/ fatty breakfast meats/ butter, cheese and high fat dairy/ and shellfish    Add more exercise whenever you can

## 2020-04-13 NOTE — Assessment & Plan Note (Signed)
Discussed how this problem influences overall health and the risks it imposes  Reviewed plan for weight loss with lower calorie diet (via better food choices and also portion control or program like weight watchers) and exercise building up to or more than 30 minutes 5 days per week including some aerobic activity   Plans to cut processed carbs more

## 2020-04-13 NOTE — Progress Notes (Signed)
Subjective:   [name] is a 74 y.o. female who presents for Medicare Annual (Subsequent) preventive examination.  Review of Systems: N/A      I connected with the patient today by telephone and verified that I am speaking with the correct person using two identifiers. Location patient: home Location nurse: work Persons participating in the virtual visit: patient, Marine scientist.   I discussed the limitations, risks, security and privacy concerns of performing an evaluation and management service by telephone and the availability of in person appointments. I also discussed with the patient that there may be a patient responsible charge related to this service. The patient expressed understanding and verbally consented to this telephonic visit.    Interactive audio and video telecommunications were attempted between this nurse and patient, however failed, due to patient having technical difficulties OR patient did not have access to video capability.  We continued and completed visit with audio only.     Cardiac Risk Factors include: advanced age (>21men, >57 women);hypertension;dyslipidemia     Objective:    Today's Vitals   04/13/20 1310  BP: 126/72  Pulse: 84  Weight: 177 lb 3 oz (80.4 kg)  Height: 5\' 4"  (1.626 m)   Body mass index is 30.41 kg/m.  Advanced Directives 04/13/2020 04/03/2019 03/28/2018 04/02/2017 03/07/2017 12/24/2016 04/26/2016  Does Patient Have a Medical Advance Directive? Yes Yes Yes Yes Yes No Yes  Type of Paramedic of [place];Living will [place];Living will [place];Living will [place];Living will [place];Living will - [place];Living will  Does patient want to make changes to medical advance directive? - No - Patient declined - - - - No - Patient declined  Copy of [place] in Chart? No - copy requested No - copy requested  No - copy requested Yes No - copy requested - No - copy requested  Would patient like information on creating a medical advance directive? - - - - - - -  Some encounter information is confidential and restricted. Go to Review Flowsheets activity to see all data.    Current Medications (verified) Outpatient Encounter Medications as of 04/13/2020  Medication Sig  . ALPRAZolam (XANAX) 0.25 MG tablet TAKE ONE TABLET BY MOUTH AT BEDTIME AS NEEDED FOR ANXIETY  . Azelastine HCl (ASTEPRO) 0.15 % SOLN 2 sprays by Nasal route 2 (two) times daily as needed.  . Calcium Carbonate-Vitamin D (CALCIUM 600+D PO) Take 1 tablet by mouth 2 (two) times daily.   Marland Kitchen EPINEPHrine 0.3 mg/0.3 mL IJ SOAJ injection Inject 0.3 mg into the skin once. as needed.  Marland Kitchen levocetirizine (XYZAL) 5 MG tablet Take 1 tablet by mouth daily.  Marland Kitchen levothyroxine (SYNTHROID) 50 MCG tablet Take 1 tablet (50 mcg total) by mouth daily before breakfast.  . Melatonin 10 MG TABS Take 1 tablet by mouth at bedtime.  . Multiple Vitamin (MULTIVITAMIN) tablet Take 1 tablet by mouth daily.    . Omega-3 Fatty Acids (FISH OIL) 1200 MG CAPS Take 1 capsule by mouth daily.   Marland Kitchen omeprazole (PRILOSEC) 20 MG capsule TAKE 2 CAPSULES BY MOUTH EVERY MORNING AND ONE IN THE IN THE EVENING  . ramipril (ALTACE) 10 MG capsule TAKE 1 CAPSULE BY MOUTH EVERY DAY  . sertraline (ZOLOFT) 100 MG tablet Take 1 tablet (100 mg total) by mouth daily.  Marland Kitchen triamterene-hydrochlorothiazide (MAXZIDE-25) 37.5-25 MG tablet Take 0.5 tablets by mouth daily.   No facility-administered  encounter medications on file as of 04/13/2020.    Allergies (verified) Atorvastatin, Ezetimibe, Lisinopril, Molds & smuts, Penicillins, Pneumococcal vaccine, Pneumococcal vaccine polyvalent, Pollen extract, Rosuvastatin, and Simvastatin   History: Past Medical History:  Diagnosis Date  . Allergic rhinitis    gets allergy shot from Dr. Carlis Abbott  . Anxiety   . Arthritis   . Diabetes mellitus without  complication (HCC)    diet controlled  . GERD (gastroesophageal reflux disease)   . History of shingles    at age 39- mild  . Hx of colonic polyp   . Hyperglycemia   . Hyperlipemia    Very high chol intol of statins-does not want to check it  . Hypertension   . Panic disorder   . Tremor    from anx  . Urinary incontinence    mixed  . Voice disorder    Past Surgical History:  Procedure Laterality Date  . ABDOMINAL HYSTERECTOMY    . BLADDER SUSPENSION    . COLONOSCOPY WITH PROPOFOL N/A 12/24/2016   Procedure: COLONOSCOPY WITH PROPOFOL;  Surgeon: Manya Silvas, MD;  Location: [facility] ENDOSCOPY;  Service: Endoscopy;  Laterality: N/A;  . ESOPHAGOGASTRODUODENOSCOPY (EGD) WITH PROPOFOL N/A 12/24/2016   Procedure: ESOPHAGOGASTRODUODENOSCOPY (EGD) WITH PROPOFOL;  Surgeon: Manya Silvas, MD;  Location: [facility] ENDOSCOPY;  Service: Endoscopy;  Laterality: N/A;  . EYE SURGERY Bilateral 2000   eye lens replacement  . HAND SURGERY Left 2016   left long trigger finger release  . JOINT REPLACEMENT     knee replacement - march 15  . KNEE ARTHROSCOPY Left 04/26/2016   Procedure: ARTHROSCOPY KNEE;  Surgeon: Hessie Knows, MD;  Location: [facility] ORS;  Service: Orthopedics;  Laterality: Left;  Marland Kitchen MEDIAL PARTIAL KNEE REPLACEMENT Left 2016  . NASAL SINUS SURGERY    . PARTIAL HYSTERECTOMY     with fibroid  . RECTOCELE REPAIR    . SYNOVECTOMY Left 04/26/2016   Procedure: PARTIAL SYNOVECTOMY;  Surgeon: Hessie Knows, MD;  Location: [facility] ORS;  Service: Orthopedics;  Laterality: Left;  . TONSILLECTOMY     Family History  Problem Relation Age of Onset  . Diabetes Mother   . Hyperlipidemia Mother   . Hypertension Mother   . Osteoarthritis Mother   . Cancer Father        Colon  . Leukemia Sister   . Syncope episode Daughter    Social History   Socioeconomic History  . Marital status: Married    Spouse name: Not on file  . Number of children: 2  . Years of education: Not on file  . Highest education  level: Not on file  Occupational History  . 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 Smoking status: Never Smoker  . Smokeless tobacco: Never Used  Vaping Use  . Vaping Use: Never used  Substance and Sexual Activity  . Alcohol use: No    Alcohol/week: 0.0 standard drinks  . Drug use: No  . Sexual activity: Not Currently    Birth control/protection: None  Other Topics Concern  . Not on file  Social History Narrative  . Not on file   Social Determinants of Health   Financial Resource Strain: Low Risk   . Difficulty of Paying Living Expenses: Not hard at all  Food Insecurity: No Food Insecurity  . Worried About Charity fundraiser in the Last Year: Never true  . Ran Out of Food in the Last Year: Never true  Transportation Needs: No Transportation Needs  . Lack of Transportation (Medical): No  . Lack of Transportation (Non-Medical): No  Physical Activity: Insufficiently Active  . Days of Exercise per Week: 4 days  . Minutes of Exercise per Session: 30 min  Stress: No Stress Concern Present  . Feeling of Stress : Not at all  Social Connections:   . Frequency of Communication with Friends and Family:   . Frequency of Social Gatherings with Friends and Family:   . Attends Religious Services:   . Active Member of Clubs or Organizations:   . Attends Archivist Meetings:   Marland Kitchen Marital Status:     Tobacco Counseling Counseling given: Not Answered   Clinical Intake:  Pre-visit preparation completed: Yes  Pain : No/denies pain     Nutritional Risks: None Diabetes: No  How often do you need to have someone help you when you read instructions, pamphlets, or other written materials from your doctor or pharmacy?: 1 - Never What is the last grade level you completed in school?: some college  Diabetic: No Nutrition Risk Assessment:  Has the patient had any N/V/D within the last 2 months?  No  Does the patient have any  non-healing wounds?  No  Has the patient had any unintentional weight loss or weight gain?  No   Diabetes:  Is the patient diabetic?  No  If diabetic, was a CBG obtained today?  No  Did the patient bring in their glucometer from home?  No  How often do you monitor your CBG's? N/A.   Financial Strains and Diabetes Management:  Are you having any financial strains with the device, your supplies or your medication? N/A.  Does the patient want to be seen by Chronic Care Management for management of their diabetes?  N/A Would the patient like to be referred to a Nutritionist or for Diabetic Management?  N/A    Interpreter Needed?: No  Information entered by :: CJohnson, LPN   Activities of Daily Living In your present state of health, do you have any difficulty performing the following activities: 04/13/2020  Hearing? N  Vision? N  Difficulty concentrating or making decisions? N  Walking or climbing stairs? N  Dressing or bathing? N  Doing errands, shopping? N  Preparing Food and eating ? N  Using the Toilet? N  In the past six months, have you accidently leaked urine? N  Do you have problems with loss of bowel control? N  Managing your Medications? N  Managing your Finances? N  Housekeeping or managing your Housekeeping? N  Some recent data might be hidden    Patient Care Team: Tower, Wynelle Fanny, MD as PCP - General Carloyn Manner, MD as Referring Physician (Otolaryngology) Hessie Knows, MD as Consulting Physician (Orthopedic Surgery) Rainey Pines, MD as Referring Physician (Psychiatry) Manya Silvas, MD (Inactive) as Consulting Physician (Gastroenterology) Eula Flax, OD as Referring Physician (Optometry)  Indicate any recent Medical Services you may have received from other than [facility] providers in the past year (date may be approximate).     Assessment:   This is a routine wellness examination for [place].  Hearing/Vision screen  Hearing Screening   125Hz   250Hz  500Hz  1000Hz  2000Hz  3000Hz  4000Hz  6000Hz  8000Hz   Right ear:           Left ear:           Vision Screening Comments: Patient gets annual eye exams  Dietary issues and exercise activities discussed: Current Exercise Habits:  Home exercise routine, Type of exercise: walking, Time (Minutes): 30, Frequency (Times/Week): 4, Weekly Exercise (Minutes/Week): 120, Intensity: Moderate, Exercise limited by: None identified  Goals    . Increase physical activity     Until released from PT, I will continue to do physical therapy exercises for 60 min/day  5 days per week.     . Increase physical activity     Starting 03/28/2018, I will continue to exercise for 90 minutes 3 days per week.    . Patient Stated     Wants to continue to lower A1C     . Patient Stated     04/13/2020, I will continue to walk 4 days a week for 30 minutes.     . Weight (lb) < 200 lb (90.7 kg)     Wants to lose a little more weight      Depression Screen PHQ 2/9 Scores 04/13/2020 06/30/2019 04/03/2019 03/28/2018 03/07/2017 03/07/2016 01/23/2016  PHQ - 2 Score 0 1 0 0 0 0 0  PHQ- 9 Score 0 5 0 0 - - -    Fall Risk Fall Risk  04/13/2020 04/03/2019 03/28/2018 03/07/2017 03/07/2016  Falls in the past year? 0 0 No No Yes  Comment - - - - pt tripped and fell; denies injury  Number falls in past yr: 0 - - - 1  Injury with Fall? 0 - - - No  Risk for fall due to : Medication side effect Medication side effect - - -  Follow up Falls evaluation completed;Falls prevention discussed Falls evaluation completed;Falls prevention discussed - - Falls evaluation completed    Any stairs in or around the home? Yes  If so, are there any without handrails? No  Home free of loose throw rugs in walkways, pet beds, electrical cords, etc? Yes  Adequate lighting in your home to reduce risk of falls? Yes   ASSISTIVE DEVICES UTILIZED TO PREVENT FALLS:  Life alert? No  Use of a cane, walker or w/c? No  Grab bars in the bathroom? Yes  Shower  chair or bench in shower? No  Elevated toilet seat or a handicapped toilet? No   TIMED UP AND GO:  Was the test performed? N/A, telephonic visit .   Cognitive Function: MMSE - Mini Mental State Exam 04/13/2020 04/03/2019 03/28/2018 03/07/2017 03/07/2016  Orientation to time 5 5 5 5 5   Orientation to Place 5 5 5 5 5   Registration 3 3 3 3 3   Attention/ Calculation 5 5 0 0 0  Recall 3 3 3 3 3   Language- name 2 objects - 0 0 0 0  Language- repeat 1 1 1 1 1   Language- follow 3 step command - 0 3 3 3   Language- read & follow direction - 0 0 0 0  Write a sentence - 0 0 0 0  Copy design - 0 0 0 0  Total score - 22 20 20 20   Mini Cog  Mini-Cog screen was completed. Maximum score is 22. A value of 0 denotes this part of the MMSE was not completed or the patient failed this part of the Mini-Cog screening.       Immunizations Immunization History  Administered Date(s) Administered  . H1N1 10/04/2008  . Hepatitis B 01/16/1992, 02/16/1992, 08/17/1992  . Influenza Split 07/18/2011, 06/04/2012  . Influenza Whole 06/17/2008, 07/19/2009, 07/11/2010  . Influenza, High Dose Seasonal PF 06/05/2017, 06/25/2019  . Influenza,inj,Quad PF,6+ Mos 06/02/2013, 06/13/2016, 06/30/2018  . Influenza,trivalent, recombinat, inj, PF 06/09/2015  .  Influenza-Unspecified 06/10/2014, 06/30/2018, 06/25/2019  . PFIZER SARS-COV-2 Vaccination 10/27/2019, 11/17/2019  . Pneumococcal Polysaccharide-23 09/17/2002  . Td 10/04/2008  . Zoster 05/25/2009  . Zoster Recombinat (Shingrix) 05/07/2018, 09/18/2018    TDAP status: Due, Education has been provided regarding the importance of this vaccine. Advised may receive this vaccine at local pharmacy or Health Dept. Aware to provide a copy of the vaccination record if obtained from local pharmacy or Health Dept. Verbalized acceptance and understanding. Flu Vaccine status: Up to date Pneumococcal vaccine status: Up to date Covid-19 vaccine status: Completed  vaccines  Qualifies for Shingles Vaccine? Yes   Zostavax completed Yes   Shingrix Completed?: Yes  Screening Tests Health Maintenance  Topic Date Due  . TETANUS/TDAP  04/13/2029 (Originally 10/04/2018)  . INFLUENZA VACCINE  04/17/2020  . MAMMOGRAM  06/30/2020  . COLONOSCOPY  12/25/2026  . DEXA SCAN  Completed  . COVID-19 Vaccine  Completed  . Hepatitis C Screening  Completed    Health Maintenance  There are no preventive care reminders to display for this patient.  Colorectal cancer screening: Completed 12/24/2016. Repeat every 10 years Mammogram status: Completed 07/01/2019. Repeat every year Bone Density status: Completed 06/26/2018. Results reflect: Bone density results: OSTEOPENIA. Repeat every 2 years.  Lung Cancer Screening: (Low Dose CT Chest recommended if Age 105-80 years, 30 pack-year currently smoking OR have quit w/in 15years.) does not qualify.    Additional Screening:  Hepatitis C Screening: does qualify; Completed 05/28/2016  Vision Screening: Recommended annual ophthalmology exams for early detection of glaucoma and other disorders of the eye. Is the patient up to date with their annual eye exam?  Yes  Who is the provider or what is the name of the office in which the patient attends annual eye exams? [facility] in [place] If pt is not established with a provider, would they like to be referred to a provider to establish care? No .   Dental Screening: Recommended annual dental exams for proper oral hygiene  Community Resource Referral / Chronic Care Management: CRR required this visit?  No   CCM required this visit?  No      Plan:     I have personally reviewed and noted the following in the patient's chart:   . Medical and social history . Use of alcohol, tobacco or illicit drugs  . Current medications and supplements . Functional ability and status . Nutritional status . Physical activity . Advanced directives . List of other  physicians . Hospitalizations, surgeries, and ER visits in previous 12 months . Vitals . Screenings to include cognitive, depression, and falls . Referrals and appointments  In addition, I have reviewed and discussed with patient certain preventive protocols, quality metrics, and best practice recommendations. A written personalized care plan for preventive services as well as general preventive health recommendations were provided to patient.   Due to this being a telephonic visit, the after visit summary with patients personalized plan was offered to patient via mail or my-chart. Patient preferred to pick up at office at next visit.   Andrez Grime, LPN   10/05/4172

## 2020-04-13 NOTE — Progress Notes (Signed)
Subjective:    Patient ID: [name], female    DOB: Feb 04, 1946, 74 y.o.   MRN: [id number]  This visit occurred during the SARS-CoV-2 public health emergency.  Safety protocols were in place, including screening questions prior to the visit, additional usage of staff PPE, and extensive cleaning of exam room while observing appropriate contact time as indicated for disinfecting solutions.    HPI  Pt presents for health mt exam   She has amw scheduled for later today virtually   The patients weight, height, BMI have been recorded in the chart and visual acuity is per eye clinic.  I have made referrals, counseling and provided education to the patient based review of the above and I have provided the pt with a written personalized care plan for preventive services. Reviewed and updated provider list, see scanned forms.  See scanned forms.  Routine anticipatory guidance given to patient.  See health maintenance. Colon cancer screening 4/18 colonoscopy  Father had colonoscopy  Breast cancer screening  Mammogram 10/20 Self breast exam- no lumps or changes  Flu vaccine 10/20 Tetanus vaccine -def for financial/insurance-complete Pneumovax-had swelling/ did not get 2nd Hep C neg 9/17 covid status -immunization  Zoster vaccine-had shingrix  Dexa 10/19 -osteopenia slt worse- due in oct  Falls- none  Fractures-none Supplements-takes ca and D  Exercise - walking     PMH and SH reviewed  Meds, vitals, and allergies reviewed.   ROS: See HPI.  Otherwise negative.    Care team  Aliviana Burdell-pcp [facility]-otrho Faheem-psychiatry Vira Agar- GI   Weight : Wt Readings from Last 3 Encounters:  04/13/20 177 lb 3 oz (80.4 kg)  09/29/19 178 lb 5 oz (80.9 kg)  06/30/19 175 lb 6 oz (79.5 kg)  no changes Walking and back exercises  Hard time loosing  30.41 kg/m   HTN bp is stable today  No cp or palpitations or headaches or edema  No side effects to medicines  BP Readings from  Last 3 Encounters:  04/13/20 126/72  09/29/19 120/74  06/30/19 122/74     Pulse Readings from Last 3 Encounters:  04/13/20 84  09/29/19 73  06/30/19 80     Hypothyroidism  Pt has no clinical changes No change in energy level/ hair or skin/ edema and no tremor Lab Results  Component Value Date   TSH 2.67 04/07/2020      Prediabetes Lab Results  Component Value Date   HGBA1C 6.3 04/07/2020  up from 6.2 She does avoid sweets  Too many processed carbs     Hyperlipidemia Lab Results  Component Value Date   CHOL 359 (H) 04/07/2020   CHOL 355 (H) 04/03/2019   CHOL 309 (H) 03/28/2018   Lab Results  Component Value Date   HDL 46.50 04/07/2020   HDL 44.20 04/03/2019   HDL 48.50 03/28/2018   Lab Results  Component Value Date   LDLCALC 286 (H) 04/07/2020   LDLCALC 287 (H) 04/03/2019   LDLCALC 242 (H) 03/28/2018   Lab Results  Component Value Date   TRIG 132.0 04/07/2020   TRIG 118.0 04/03/2019   TRIG 91.0 03/28/2018   Lab Results  Component Value Date   CHOLHDL 8 04/07/2020   CHOLHDL 8 04/03/2019   CHOLHDL 6 03/28/2018   Lab Results  Component Value Date   LDLDIRECT 242.8 01/15/2012   LDLDIRECT 237.0 12/18/2010   LDLDIRECT 227.8 09/20/2009   Intolerant of all statins  Has been to lipid clinic  pcyk9 inhibitors are  not covered still /cannot afford  Does watch diet   Lab Results  Component Value Date   WBC 7.4 04/07/2020   HGB 15.0 04/07/2020   HCT 43.9 04/07/2020   MCV 86.7 04/07/2020   PLT 232.0 04/07/2020   Lab Results  Component Value Date   CREATININE 1.08 04/07/2020   BUN 24 (H) 04/07/2020   NA 137 04/07/2020   K 4.6 04/07/2020   CL 101 04/07/2020   CO2 29 04/07/2020   Lab Results  Component Value Date   ALT 18 04/07/2020   AST 16 04/07/2020   ALKPHOS 70 04/07/2020   BILITOT 0.8 04/07/2020   glucose 114     Patient Active Problem List   Diagnosis Date Noted  . Chest wall pain 04/23/2019  . Osteopenia 02/13/2016  . Routine  general medical examination at a health care facility 01/23/2016  . Estrogen deficiency 01/23/2016  . Hypothyroid 01/23/2016  . Mild depression ([place]) 02/21/2015  . H/O diabetes mellitus 01/04/2015  . H/O arthritis 01/04/2015  . History of hay fever 01/04/2015  . Encounter for Medicare annual wellness exam 01/14/2013  . Obesity (BMI 30-39.9) 01/15/2012  . MENOPAUSAL SYNDROME 10/04/2008  . Prediabetes 12/02/2007  . TREMOR 12/02/2007  . Hyperlipidemia 01/28/2007  . Generalized anxiety disorder 01/28/2007  . Essential hypertension 01/28/2007  . GERD 01/28/2007  . URINARY INCONTINENCE 01/28/2007  . COLONIC POLYPS, HX OF 01/28/2007  . HERPES ZOSTER 01/20/2007  . DYSPHONIA 01/20/2007  . COUGH, CHRONIC 01/20/2007  . ALLERGY 01/20/2007   Past Medical History:  Diagnosis Date  . Allergic rhinitis    gets allergy shot from Dr. Carlis Abbott  . Anxiety   . Arthritis   . Diabetes mellitus without complication (HCC)    diet controlled  . GERD (gastroesophageal reflux disease)   . History of shingles    at age 28- mild  . Hx of colonic polyp   . Hyperglycemia   . Hyperlipemia    Very high chol intol of statins-does not want to check it  . Hypertension   . Panic disorder   . Tremor    from anx  . Urinary incontinence    mixed  . Voice disorder    Past Surgical History:  Procedure Laterality Date  . ABDOMINAL HYSTERECTOMY    . BLADDER SUSPENSION    . COLONOSCOPY WITH PROPOFOL N/A 12/24/2016   Procedure: COLONOSCOPY WITH PROPOFOL;  Surgeon: Manya Silvas, MD;  Location: [facility] ENDOSCOPY;  Service: Endoscopy;  Laterality: N/A;  . ESOPHAGOGASTRODUODENOSCOPY (EGD) WITH PROPOFOL N/A 12/24/2016   Procedure: ESOPHAGOGASTRODUODENOSCOPY (EGD) WITH PROPOFOL;  Surgeon: Manya Silvas, MD;  Location: [facility] ENDOSCOPY;  Service: Endoscopy;  Laterality: N/A;  . EYE SURGERY Bilateral 2000   eye lens replacement  . HAND SURGERY Left 2016   left long trigger finger release  . JOINT REPLACEMENT     knee  replacement - march 15  . KNEE ARTHROSCOPY Left 04/26/2016   Procedure: ARTHROSCOPY KNEE;  Surgeon: Hessie Knows, MD;  Location: [facility] ORS;  Service: Orthopedics;  Laterality: Left;  Marland Kitchen MEDIAL PARTIAL KNEE REPLACEMENT Left 2016  . NASAL SINUS SURGERY    . PARTIAL HYSTERECTOMY     with fibroid  . RECTOCELE REPAIR    . SYNOVECTOMY Left 04/26/2016   Procedure: PARTIAL SYNOVECTOMY;  Surgeon: Hessie Knows, MD;  Location: [facility] ORS;  Service: Orthopedics;  Laterality: Left;  . TONSILLECTOMY     Social History   Tobacco Use  . Smoking status: Never Smoker  . Smokeless  tobacco: Never Used  Vaping Use  . Vaping Use: Never used  Substance Use Topics  . Alcohol use: No    Alcohol/week: 0.0 standard drinks  . Drug use: No   Family History  Problem Relation Age of Onset  . Diabetes Mother   . Hyperlipidemia Mother   . Hypertension Mother   . Osteoarthritis Mother   . Cancer Father        Colon  . Leukemia Sister   . Syncope episode Daughter    Allergies  Allergen Reactions  . Atorvastatin     REACTION: shoulder pain  . Ezetimibe     REACTION: ?  . Lisinopril     REACTION: muscle pain  . Molds & Smuts   . Penicillins     REACTION: reaction not known Has patient had a PCN reaction causing immediate rash, facial/tongue/throat swelling, SOB or lightheadedness with hypotension: unknown Has patient had a PCN reaction causing severe rash involving mucus membranes or skin necrosis: unknown Has patient had a PCN reaction that required hospitalization unsure Has patient had a PCN reaction occurring within the last 10 years: no If all of the above answers are "NO", then may proceed with Cephalosporin use.  . Pneumococcal Vaccine Swelling    REACTION: severe local reaction  . Pneumococcal Vaccine Polyvalent     REACTION: severe local reaction  . Pollen Extract   . Rosuvastatin     REACTION: leg pain  . Simvastatin     REACTION: muscle pain   Current Outpatient Medications on File Prior  to Visit  Medication Sig Dispense Refill  . ALPRAZolam (XANAX) 0.25 MG tablet TAKE ONE TABLET BY MOUTH AT BEDTIME AS NEEDED FOR ANXIETY 90 tablet 0  . Azelastine HCl (ASTEPRO) 0.15 % SOLN 2 sprays by Nasal route 2 (two) times daily as needed. 30 mL 11  . Calcium Carbonate-Vitamin D (CALCIUM 600+D PO) Take 1 tablet by mouth 2 (two) times daily.     Marland Kitchen EPINEPHrine 0.3 mg/0.3 mL IJ SOAJ injection Inject 0.3 mg into the skin once. as needed.    Marland Kitchen levocetirizine (XYZAL) 5 MG tablet Take 1 tablet by mouth daily.    Marland Kitchen levothyroxine (SYNTHROID) 50 MCG tablet Take 1 tablet (50 mcg total) by mouth daily before breakfast. 90 tablet 2  . Melatonin 10 MG TABS Take 1 tablet by mouth at bedtime.    . Multiple Vitamin (MULTIVITAMIN) tablet Take 1 tablet by mouth daily.      . Omega-3 Fatty Acids (FISH OIL) 1200 MG CAPS Take 1 capsule by mouth daily.     Marland Kitchen omeprazole (PRILOSEC) 20 MG capsule TAKE 2 CAPSULES BY MOUTH EVERY MORNING AND ONE IN THE IN THE EVENING 270 capsule 0  . ramipril (ALTACE) 10 MG capsule TAKE 1 CAPSULE BY MOUTH EVERY DAY 90 capsule 1   No current facility-administered medications on file prior to visit.    Review of Systems  Constitutional: Negative for activity change, appetite change, fatigue, fever and unexpected weight change.  HENT: Negative for congestion, ear pain, rhinorrhea, sinus pressure and sore throat.   Eyes: Negative for pain, redness and visual disturbance.  Respiratory: Negative for cough, shortness of breath and wheezing.   Cardiovascular: Negative for chest pain and palpitations.  Gastrointestinal: Negative for abdominal pain, blood in stool, constipation and diarrhea.  Endocrine: Negative for polydipsia and polyuria.  Genitourinary: Negative for dysuria, frequency and urgency.  Musculoskeletal: Positive for arthralgias and back pain. Negative for myalgias.  Skin: Negative for pallor  and rash.  Allergic/Immunologic: Negative for environmental allergies.  Neurological:  Negative for dizziness, syncope and headaches.  Hematological: Negative for adenopathy. Does not bruise/bleed easily.  Psychiatric/Behavioral: Negative for decreased concentration and dysphoric mood. The patient is not nervous/anxious.        Objective:   Physical Exam Constitutional:      General: She is not in acute distress.    Appearance: Normal appearance. She is well-developed. She is obese. She is not ill-appearing or diaphoretic.  HENT:     Head: Normocephalic and atraumatic.     Right Ear: Tympanic membrane, ear canal and external ear normal.     Left Ear: Tympanic membrane, ear canal and external ear normal.     Nose: Nose normal. No congestion.     Mouth/Throat:     Mouth: Mucous membranes are moist.     Pharynx: Oropharynx is clear. No posterior oropharyngeal erythema.  Eyes:     General: No scleral icterus.    Extraocular Movements: Extraocular movements intact.     Conjunctiva/sclera: Conjunctivae normal.     Pupils: Pupils are equal, round, and reactive to light.  Neck:     Thyroid: No thyromegaly.     Vascular: No carotid bruit or JVD.  Cardiovascular:     Rate and Rhythm: Normal rate and regular rhythm.     Pulses: Normal pulses.     Heart sounds: Normal heart sounds. No gallop.   Pulmonary:     Effort: Pulmonary effort is normal. No respiratory distress.     Breath sounds: Normal breath sounds. No wheezing.     Comments: Good air exch Chest:     Chest wall: No tenderness.  Abdominal:     General: Bowel sounds are normal. There is no distension or abdominal bruit.     Palpations: Abdomen is soft. There is no mass.     Tenderness: There is no abdominal tenderness.     Hernia: No hernia is present.  Genitourinary:    Comments: Breast exam: No mass, nodules, thickening, tenderness, bulging, retraction, inflamation, nipple discharge or skin changes noted.  No axillary or clavicular LA.     Musculoskeletal:        General: No tenderness. Normal range of  motion.     Cervical back: Normal range of motion and neck supple. No rigidity. No muscular tenderness.     Right lower leg: No edema.     Left lower leg: No edema.  Lymphadenopathy:     Cervical: No cervical adenopathy.  Skin:    General: Skin is warm and dry.     Coloration: Skin is not pale.     Findings: No erythema or rash.     Comments: sks diffusely    Neurological:     Mental Status: She is alert. Mental status is at baseline.     Cranial Nerves: No cranial nerve deficit.     Motor: No abnormal muscle tone.     Coordination: Coordination normal.     Gait: Gait normal.     Deep Tendon Reflexes: Reflexes are normal and symmetric. Reflexes normal.  Psychiatric:        Mood and Affect: Mood normal.        Cognition and Memory: Cognition and memory normal.           Assessment & Plan:   Problem List Items Addressed This Visit      Cardiovascular and Mediastinum   Essential hypertension    bp in fair control  at this time  BP Readings from Last 1 Encounters:  04/13/20 126/72   No changes needed Most recent labs reviewed  Disc lifstyle change with low sodium diet and exercise        Relevant Medications   triamterene-hydrochlorothiazide (MAXZIDE-25) 37.5-25 MG tablet     Endocrine   Hypothyroid    Hypothyroidism  Pt has no clinical changes No change in energy level/ hair or skin/ edema and no tremor Lab Results  Component Value Date   TSH 2.67 04/07/2020            Musculoskeletal and Integument   Osteopenia    Due for 2 y dexa in oct-she will call for order No falls or fx Taking ca and D Walking-encouraged more exercise as tolerated        Other   Prediabetes    Lab Results  Component Value Date   HGBA1C 6.3 04/07/2020   disc imp of low glycemic diet and wt loss to prevent DM2       Hyperlipidemia    Disc goals for lipids and reasons to control them Rev last labs with pt Rev low sat fat diet in detail Pt is intol to all statins as  well as zetia Has been to lipid clinic  Cannot afford pcyk9 inhibitor  LDL is still well above 200 unfortunately      Relevant Medications   triamterene-hydrochlorothiazide (MAXZIDE-25) 37.5-25 MG tablet   Generalized anxiety disorder    No clinical changes       Relevant Medications   sertraline (ZOLOFT) 100 MG tablet   Obesity (BMI 30-39.9)    Discussed how this problem influences overall health and the risks it imposes  Reviewed plan for weight loss with lower calorie diet (via better food choices and also portion control or program like weight watchers) and exercise building up to or more than 30 minutes 5 days per week including some aerobic activity   Plans to cut processed carbs more      Mild depression ([place])    Clinically stable on sertraline No longer sees psychiatry      Relevant Medications   sertraline (ZOLOFT) 100 MG tablet   Routine general medical examination at a health care facility - Primary    Reviewed health habits including diet and exercise and skin cancer prevention Reviewed appropriate screening tests for age  Also reviewed health mt list, fam hx and immunization status , as well as social and family history   See HPI Has amw visit later today  covid vaccinated and had shingrix also  dexa due in oct (no falls or fx)  Labs reviewed  Enc lower glycemic diet

## 2020-04-13 NOTE — Assessment & Plan Note (Signed)
Disc goals for lipids and reasons to control them Rev last labs with pt Rev low sat fat diet in detail Pt is intol to all statins as well as zetia Has been to lipid clinic  Cannot afford pcyk9 inhibitor  LDL is still well above 200 unfortunately

## 2020-04-13 NOTE — Assessment & Plan Note (Signed)
No clinical changes 

## 2020-05-06 DIAGNOSIS — J301 Allergic rhinitis due to pollen: Secondary | ICD-10-CM | POA: Diagnosis not present

## 2020-05-09 DIAGNOSIS — J301 Allergic rhinitis due to pollen: Secondary | ICD-10-CM | POA: Diagnosis not present

## 2020-05-20 ENCOUNTER — Ambulatory Visit
Admission: EM | Admit: 2020-05-20 | Discharge: 2020-05-20 | Disposition: A | Discharge status: Home / Self Care | Payer: Medicare PPO | Attending: Family Medicine | Admitting: Family Medicine

## 2020-05-20 ENCOUNTER — Other Ambulatory Visit: Payer: Self-pay

## 2020-05-20 DIAGNOSIS — M79642 Pain in left hand: Secondary | ICD-10-CM | POA: Diagnosis not present

## 2020-05-20 DIAGNOSIS — T148XXA Other injury of unspecified body region, initial encounter: Secondary | ICD-10-CM

## 2020-05-20 MED ORDER — CEPHALEXIN 500 MG PO CAPS: 500.0000 mg | ORAL_CAPSULE | Freq: Two times a day (BID) | ORAL | 0 refills | Status: AC

## 2020-05-20 NOTE — ED Triage Notes (Signed)
Pt had large splinter from wooden banister go into L palm approx 6 days ago.  Has used epsom salt, peroxide, soaking, etc but still feels a piece in the hand and pain is increasing. Redness noted to L palm.  TTP.

## 2020-05-20 NOTE — ED Provider Notes (Signed)
Roderic Palau    CSN: [id number] Arrival date & time: 05/20/20  1049      History   Chief Complaint No chief complaint on file.   HPI [name] is a 74 y.o. female.   Reports that she got a large wood splinter to the palm of the left hand about 6 days ago. Reports that she feels like she was able to remove part of the splinter. States that the area is red, warm, and painful. Has not attempted OTC treatments for this. Denies fever, headaches, sore throat, nausea, vomiting, diarrhea, rash, fever, other symptoms.  ROS per HPI  The history is provided by the patient.    Past Medical History:  Diagnosis Date  . Allergic rhinitis    gets allergy shot from Dr. Carlis Abbott  . Anxiety   . Arthritis   . Diabetes mellitus without complication (HCC)    diet controlled  . GERD (gastroesophageal reflux disease)   . History of shingles    at age 64- mild  . Hx of colonic polyp   . Hyperglycemia   . Hyperlipemia    Very high chol intol of statins-does not want to check it  . Hypertension   . Panic disorder   . Tremor    from anx  . Urinary incontinence    mixed  . Voice disorder     Patient Active Problem List   Diagnosis Date Noted  . Chest wall pain 04/23/2019  . Osteopenia 02/13/2016  . Routine general medical examination at a health care facility 01/23/2016  . Estrogen deficiency 01/23/2016  . Hypothyroid 01/23/2016  . Mild depression ([place]) 02/21/2015  . H/O diabetes mellitus 01/04/2015  . H/O arthritis 01/04/2015  . History of hay fever 01/04/2015  . Encounter for Medicare annual wellness exam 01/14/2013  . Obesity (BMI 30-39.9) 01/15/2012  . MENOPAUSAL SYNDROME 10/04/2008  . Prediabetes 12/02/2007  . TREMOR 12/02/2007  . Hyperlipidemia 01/28/2007  . Generalized anxiety disorder 01/28/2007  . Essential hypertension 01/28/2007  . GERD 01/28/2007  . URINARY INCONTINENCE 01/28/2007  . COLONIC POLYPS, HX OF 01/28/2007  . HERPES ZOSTER 01/20/2007  .  DYSPHONIA 01/20/2007  . COUGH, CHRONIC 01/20/2007  . ALLERGY 01/20/2007    Past Surgical History:  Procedure Laterality Date  . ABDOMINAL HYSTERECTOMY    . BLADDER SUSPENSION    . COLONOSCOPY WITH PROPOFOL N/A 12/24/2016   Procedure: COLONOSCOPY WITH PROPOFOL;  Surgeon: Manya Silvas, MD;  Location: [facility] ENDOSCOPY;  Service: Endoscopy;  Laterality: N/A;  . ESOPHAGOGASTRODUODENOSCOPY (EGD) WITH PROPOFOL N/A 12/24/2016   Procedure: ESOPHAGOGASTRODUODENOSCOPY (EGD) WITH PROPOFOL;  Surgeon: Manya Silvas, MD;  Location: [facility] ENDOSCOPY;  Service: Endoscopy;  Laterality: N/A;  . EYE SURGERY Bilateral 2000   eye lens replacement  . HAND SURGERY Left 2016   left long trigger finger release  . JOINT REPLACEMENT     knee replacement - march 15  . KNEE ARTHROSCOPY Left 04/26/2016   Procedure: ARTHROSCOPY KNEE;  Surgeon: Hessie Knows, MD;  Location: [facility] ORS;  Service: Orthopedics;  Laterality: Left;  Marland Kitchen MEDIAL PARTIAL KNEE REPLACEMENT Left 2016  . NASAL SINUS SURGERY    . PARTIAL HYSTERECTOMY     with fibroid  . RECTOCELE REPAIR    . SYNOVECTOMY Left 04/26/2016   Procedure: PARTIAL SYNOVECTOMY;  Surgeon: Hessie Knows, MD;  Location: [facility] ORS;  Service: Orthopedics;  Laterality: Left;  . TONSILLECTOMY      OB History   No obstetric history on file.  Home Medications    Prior to Admission medications   Medication Sig Start Date End Date Taking? Authorizing Provider  ALPRAZolam (XANAX) 0.25 MG tablet TAKE ONE TABLET BY MOUTH AT BEDTIME AS NEEDED FOR ANXIETY 03/31/20  Yes Tower, Marne A, MD  Azelastine HCl (ASTEPRO) 0.15 % SOLN 2 sprays by Nasal route 2 (two) times daily as needed. 12/26/10  Yes Tower, Wynelle Fanny, MD  Calcium Carbonate-Vitamin D (CALCIUM 600+D PO) Take 1 tablet by mouth 2 (two) times daily.    Yes [provider]  EPINEPHrine 0.3 mg/0.3 mL IJ SOAJ injection Inject 0.3 mg into the skin once. as needed. 10/22/14  Yes [provider]  levocetirizine (XYZAL) 5  MG tablet Take 1 tablet by mouth daily. 05/20/19  Yes [provider]  levothyroxine (SYNTHROID) 50 MCG tablet Take 1 tablet (50 mcg total) by mouth daily before breakfast. 09/30/19  Yes Tower, Wynelle Fanny, MD  Melatonin 10 MG TABS Take 1 tablet by mouth at bedtime.   Yes [provider]  Multiple Vitamin (MULTIVITAMIN) tablet Take 1 tablet by mouth daily.     Yes [provider]  Omega-3 Fatty Acids (FISH OIL) 1200 MG CAPS Take 1 capsule by mouth daily.    Yes [provider]  omeprazole (PRILOSEC) 20 MG capsule TAKE 2 CAPSULES BY MOUTH EVERY MORNING AND ONE IN THE IN THE EVENING 03/31/20  Yes Tower, Marne A, MD  ramipril (ALTACE) 10 MG capsule TAKE 1 CAPSULE BY MOUTH EVERY DAY 04/05/20  Yes Tower, Wynelle Fanny, MD  sertraline (ZOLOFT) 100 MG tablet Take 1 tablet (100 mg total) by mouth daily. 04/13/20  Yes Tower, Wynelle Fanny, MD  triamterene-hydrochlorothiazide (MAXZIDE-25) 37.5-25 MG tablet Take 0.5 tablets by mouth daily. 04/13/20  Yes Tower, Wynelle Fanny, MD  cephALEXin (KEFLEX) 500 MG capsule Take 1 capsule (500 mg total) by mouth 2 (two) times daily for 7 days. 05/20/20 05/27/20  Faustino Congress, NP    Family History Family History  Problem Relation Age of Onset  . Diabetes Mother   . Hyperlipidemia Mother   . Hypertension Mother   . Osteoarthritis Mother   . Cancer Father        Colon  . Leukemia Sister   . Syncope episode Daughter     Social History Social History   Tobacco Use  . Smoking status: Never Smoker  . Smokeless tobacco: Never Used  Vaping Use  . Vaping Use: Never used  Substance Use Topics  . Alcohol use: No    Alcohol/week: 0.0 standard drinks  . Drug use: No     Allergies   Atorvastatin, Ezetimibe, Lisinopril, Molds & smuts, Penicillins, Pneumococcal vaccine, Pneumococcal vaccine polyvalent, Pollen extract, Rosuvastatin, and Simvastatin   Review of Systems Review of Systems   Physical Exam Triage Vital Signs ED Triage Vitals  Enc  Vitals Group     BP 05/20/20 1056 134/84     Pulse Rate 05/20/20 1056 83     Resp 05/20/20 1056 18     Temp 05/20/20 1056 98.3 F (36.8 C)     Temp Source 05/20/20 1056 Oral     SpO2 05/20/20 1056 95 %     Weight --      Height --      Head Circumference --      Peak Flow --      Pain Score 05/20/20 1053 6     Pain Loc --      Pain Edu? --  Excl. in GC? --    No data found.  Updated Vital Signs BP 134/84 (BP Location: Right Arm)   Pulse 83   Temp 98.3 F (36.8 C) (Oral)   Resp 18   LMP 09/18/1987   SpO2 95%   Visual Acuity Right Eye Distance:   Left Eye Distance:   Bilateral Distance:    Right Eye Near:   Left Eye Near:    Bilateral Near:     Physical Exam Vitals and nursing note reviewed.  Constitutional:      General: She is not in acute distress.    Appearance: Normal appearance. She is well-developed. She is not ill-appearing.  HENT:     Head: Normocephalic and atraumatic.  Eyes:     Conjunctiva/sclera: Conjunctivae normal.  Cardiovascular:     Rate and Rhythm: Normal rate and regular rhythm.     Heart sounds: No murmur heard.   Pulmonary:     Effort: Pulmonary effort is normal. No respiratory distress.     Breath sounds: Normal breath sounds.  Abdominal:     Palpations: Abdomen is soft.     Tenderness: There is no abdominal tenderness.  Musculoskeletal:     Cervical back: Neck supple.  Skin:    General: Skin is warm and dry.     Capillary Refill: Capillary refill takes less than 2 seconds.     Findings: Erythema present.     Comments: Palm of L hand  Neurological:     General: No focal deficit present.     Mental Status: She is alert and oriented to person, place, and time.  Psychiatric:        Mood and Affect: Mood normal.        Behavior: Behavior normal.        Thought Content: Thought content normal.      UC Treatments / Results  Labs (all labs ordered are listed, but only abnormal results are displayed) Labs Reviewed - No data  to display  EKG   Radiology No results found.  Procedures Foreign Body Removal  Date/Time: 05/20/2020 1:30 PM Performed by: Faustino Congress, NP Authorized by: Faustino Congress, NP   Consent:    Consent obtained:  Verbal   Consent given by:  Patient   Risks discussed:  Infection, pain and incomplete removal   Alternatives discussed:  No treatment and referral Location:    Location:  Hand   Hand location:  L palm   Tendon involvement:  None Anesthesia (see MAR for exact dosages):    Anesthesia method:  Local infiltration   Local anesthetic:  Lidocaine 1% WITH epi Procedure type:    Procedure complexity:  Simple Procedure details:    Scalpel size:  11   Incision length:  0.25 cm   Localization method:  Probed   Dissection of underlying tissues: no     Removal mechanism:  Forceps   Foreign bodies recovered:  1   Description:  1.5cm long wood splinter   Intact foreign body removal: yes   Post-procedure details:    Neurovascular status: intact     Confirmation:  No additional foreign bodies on visualization   Repair method: tissue adhesive.   Dressing:  Open (no dressing)   Patient tolerance of procedure:  Tolerated well, no immediate complications   (including critical care time)  Medications Ordered in UC Medications - No data to display  Initial Impression / Assessment and Plan / UC Course  I have reviewed the triage vital signs  and the nursing notes.  Pertinent labs & imaging results that were available during my care of the patient were reviewed by me and considered in my medical decision making (see chart for details).    Left hand pain Splinter in skin  Presents with L hand pain for the last 6 days Has wood splinter from old handrail to palm of L hand Able to palpate just under the skin, not visible 1% lidocaine with Epi to the area 0.25cm incision to palm of L hand, able to palpate and grasp splinter with forceps, removed intact Tissue adhesive  to the area Tolerated very well Prescribed Keflex Follow up as needed Final Clinical Impressions(s) / UC Diagnoses   Final diagnoses:  Left hand pain  Splinter in skin     Discharge Instructions     We have removed the splinter from your hand  The skin glue will fall off on its own as the skin heals underneath  I have sent in Keflex for you to take twice a day for 7 days  Follow up with this office or with primary care as needed    ED Prescriptions    Medication Sig Dispense Auth. Provider   cephALEXin (KEFLEX) 500 MG capsule Take 1 capsule (500 mg total) by mouth 2 (two) times daily for 7 days. 14 capsule Faustino Congress, NP     PDMP not reviewed this encounter.   Faustino Congress, NP 05/20/20 1333

## 2020-05-20 NOTE — Discharge Instructions (Signed)
We have removed the splinter from your hand  The skin glue will fall off on its own as the skin heals underneath  I have sent in Keflex for you to take twice a day for 7 days  Follow up with this office or with primary care as needed

## 2020-05-30 ENCOUNTER — Other Ambulatory Visit: Payer: Self-pay | Admitting: Family Medicine

## 2020-05-30 DIAGNOSIS — Z1231 Encounter for screening mammogram for malignant neoplasm of breast: Secondary | ICD-10-CM

## 2020-06-01 ENCOUNTER — Other Ambulatory Visit: Payer: Self-pay | Admitting: Family Medicine

## 2020-06-23 ENCOUNTER — Other Ambulatory Visit: Payer: Self-pay | Admitting: Family Medicine

## 2020-06-29 DIAGNOSIS — E119 Type 2 diabetes mellitus without complications: Secondary | ICD-10-CM | POA: Diagnosis not present

## 2020-07-01 ENCOUNTER — Other Ambulatory Visit: Payer: Self-pay | Admitting: Family Medicine

## 2020-07-04 NOTE — Telephone Encounter (Signed)
Patient called. Patient ran out of Alprazolam last night. Patient's requesting refill be sent to [facility].

## 2020-07-04 NOTE — Telephone Encounter (Signed)
Name of Medication:Xanax 0.25 mg Name of [place] or Written Date and Quantity:03/31/20 #90 with 0 refills Last Office Visit and Type:04/13/20 CPE Next Office Visit and Type:04/21/21 CPE Last Controlled Substance Agreement Date:none Last FPF:[id number]

## 2020-07-05 ENCOUNTER — Other Ambulatory Visit: Payer: Self-pay

## 2020-07-05 ENCOUNTER — Ambulatory Visit
Admission: RE | Admit: 2020-07-05 | Discharge: 2020-07-05 | Disposition: A | Discharge status: Home / Self Care | Payer: Medicare PPO | Source: Ambulatory Visit | Attending: Family Medicine | Admitting: Family Medicine

## 2020-07-05 DIAGNOSIS — Z1231 Encounter for screening mammogram for malignant neoplasm of breast: Secondary | ICD-10-CM | POA: Diagnosis not present

## 2020-07-27 DIAGNOSIS — Z1152 Encounter for screening for COVID-19: Secondary | ICD-10-CM | POA: Diagnosis not present

## 2020-07-27 DIAGNOSIS — Z03818 Encounter for observation for suspected exposure to other biological agents ruled out: Secondary | ICD-10-CM | POA: Diagnosis not present

## 2020-07-29 ENCOUNTER — Telehealth: Payer: Self-pay

## 2020-07-29 ENCOUNTER — Telehealth (INDEPENDENT_AMBULATORY_CARE_PROVIDER_SITE_OTHER): Payer: Medicare PPO | Admitting: Family Medicine

## 2020-07-29 ENCOUNTER — Encounter: Payer: Self-pay | Admitting: Family Medicine

## 2020-07-29 VITALS — BP 120/70 | Wt 175.0 lb

## 2020-07-29 DIAGNOSIS — J209 Acute bronchitis, unspecified: Secondary | ICD-10-CM | POA: Diagnosis not present

## 2020-07-29 MED ORDER — AZITHROMYCIN 250 MG PO TABS: ORAL_TABLET | ORAL | 0 refills | Status: DC

## 2020-07-29 MED ORDER — HYDROCODONE-HOMATROPINE 5-1.5 MG/5ML PO SYRP: 5.0000 mL | ORAL_SOLUTION | Freq: Every evening | ORAL | 0 refills | Status: DC | PRN

## 2020-07-29 MED ORDER — ALBUTEROL SULFATE HFA 108 (90 BASE) MCG/ACT IN AERS: 2.0000 | INHALATION_SPRAY | Freq: Four times a day (QID) | RESPIRATORY_TRACT | 2 refills | Status: DC | PRN

## 2020-07-29 NOTE — Telephone Encounter (Signed)
[place] Day - Client TELEPHONE ADVICE RECORD Access[facility]urse Patient Name: [name] Gender: Female DOB: April 21, 1946 Age: 74 Y 2 M 17 D Return Phone Number: [phone] (Primary), [phone] (Secondary) Address: City/State/Zip: [place] [address] Client [facility] - Client Client Site [facility] Glori Bickers, Roque Lias - MD Contact Type Call Who Is Calling Patient / Member / Family / Caregiver Call Type Triage / Clinical Relationship To Patient Self Return Phone Number [phone] (Primary) Chief Complaint Cough Reason for Call Symptomatic / Request for Health Information Initial Comment Caller states she negative Covid, fever 101. , persistant cough. Taken Mucinex Translation No Nurse Assessment Nurse: Hardin Negus, RN, Mardene Celeste Date/Time (Eastern Time): 07/29/2020 9:25:08 AM Confirm and document reason for call. If symptomatic, describe symptoms. ---She negative Covid, fever 101 o . at night. , persistent cough. Taken Mucinex. She thinks she might have bronchitis . She is having "non-stop coughing" She is sore from the coughing. She is not approving. When she lays down she coughs more. She is eating and drinking well. Does the patient have any new or worsening symptoms? ---Yes Will a triage be completed? ---Yes Related visit to physician within the last 2 weeks? ---No Does the PT have any chronic conditions? (i.e. diabetes, asthma, this includes High risk factors for pregnancy, etc.) ---Yes List chronic conditions. ---allergies,hypertension, diabetic Is this a behavioral health or substance abuse call? ---No Guidelines Guideline Title Affirmed Question Affirmed Notes Nurse Date/Time (Eastern Time) Breathing Difficulty Wheezing can be heard across the room Oren Bracket 07/29/2020 9:28:46 AM Disp. Time Eilene Ghazi Time) Disposition Final User 07/29/2020 9:34:19 AM Go to ED Now Yes  Hardin Negus, RN, [place] Disagree/Comply Disagree Caller Understands Yes PLEASE NOTE: All timestamps contained within this report are represented as [place] Standard Time. CONFIDENTIALTY NOTICE: This fax transmission is intended only for the addressee. It contains information that is legally privileged, confidential or otherwise protected from use or disclosure. If you are not the intended recipient, you are strictly prohibited from reviewing, disclosing, copying using or disseminating any of this information or taking any action in reliance on or regarding this information. If you have received this fax in error, please notify us immediately by telephone so that we can arrange for its return to [place]. Phone: [phone], Toll-Free: [phone], Fax: [phone] Page: 2 of 2 Call Id: [id number] PreDisposition Call Doctor Care Advice Given Per Guideline GO TO ED NOW: CARE ADVICE given per Breathing Difficulty (Adult) guideline. Comments User: Ledora Bottcher, RN Date/Time Eilene Ghazi Time): 07/29/2020 9:28:02 AM She is wheezing User: Ledora Bottcher, RN Date/Time Eilene Ghazi Time): 07/29/2020 9:34:09 AM She has a phone visit at 240pm today. Referrals GO TO FACILITY REFUSED

## 2020-07-29 NOTE — Progress Notes (Signed)
TELEPHONE VISIT Due to national recommendations of social distancing due to [place] 19, a virtual visit is felt to be most appropriate for this patient at this time.   Interactive audio and video telecommunications were attempted between this provider and patient, however failed, due to patient having technical difficulties OR patient did not have access to video capability.  We continued and completed visit with audio only.   I connected with the patient on 07/29/20 at  2:40 PM EST by  telehealth platform and verified that I am speaking with the correct person using two identifiers.   I discussed the limitations, risks, security and privacy concerns of performing an evaluation and management service by  virtual telehealth platform and the availability of in person appointments. I also discussed with the patient that there may be a patient responsible charge related to this service. The patient expressed understanding and agreed to proceed.  Patient location: Home Provider Location:  [facility] Participants: Eliezer Lofts and Iva Boop   Chief Complaint  Patient presents with   URI    Symptoms started 07-24-20 with cough, fever, wheezing. Covid test came test came back negative Wednesday evening.    History of Present Illness: Cough This is a new problem. Episode onset: Illness onset 07/24/2020. The problem has been gradually worsening. The problem occurs constantly. The cough is productive of purulent sputum. Associated symptoms include a fever, a sore throat and wheezing. Pertinent negatives include no chest pain, chills, ear pain, headaches, myalgias, nasal congestion, rash or shortness of breath. Associated symptoms comments:  Fatigue   Fever daily 101.0 F. The symptoms are aggravated by lying down. Treatments tried: mucinex , ibuprofen. The treatment provided mild relief. Her past medical history is significant for bronchitis. There is no history of asthma, bronchiectasis, COPD,  emphysema, environmental allergies or pneumonia.   negative COVID PCR test negative 07/27/2020  COVID vaccine x 2, 10/2019, 11/2019   Nonsmoker, no chronic lung disease history   Husband with similar symptoms.Marland Kitchen also negative for COVID.   COVID 19 screen No recent travel or known exposure to [place]  The importance of social distancing was discussed today.   Review of Systems  Constitutional: Positive for fever. Negative for chills.  HENT: Positive for sore throat. Negative for ear pain.   Respiratory: Positive for cough and wheezing. Negative for shortness of breath.   Cardiovascular: Negative for chest pain.  Musculoskeletal: Negative for myalgias.  Skin: Negative for rash.  Neurological: Negative for headaches.  Endo/Heme/Allergies: Negative for environmental allergies.      Past Medical History:  Diagnosis Date   Allergic rhinitis    gets allergy shot from Dr. Carlis Abbott   Anxiety    Arthritis    Diabetes mellitus without complication ([place])    diet controlled   GERD (gastroesophageal reflux disease)    History of shingles    at age 28- mild   Hx of colonic polyp    Hyperglycemia    Hyperlipemia    Very high chol intol of statins-does not want to check it   Hypertension    Panic disorder    Tremor    from anx   Urinary incontinence    mixed   Voice disorder     reports that she has never smoked. She has never used smokeless tobacco. She reports that she does not drink alcohol and does not use drugs.   Current Outpatient Medications:    ALPRAZolam (XANAX) 0.25 MG tablet, TAKE ONE TABLET BY MOUTH  AT BEDTIME AS NEEDED FOR ANXIETY, Disp: 90 tablet, Rfl: 0   Azelastine HCl (ASTEPRO) 0.15 % SOLN, 2 sprays by Nasal route 2 (two) times daily as needed., Disp: 30 mL, Rfl: 11   Calcium Carbonate-Vitamin D (CALCIUM 600+D PO), Take 1 tablet by mouth 2 (two) times daily. , Disp: , Rfl:    EPINEPHrine 0.3 mg/0.3 mL IJ SOAJ injection, Inject 0.3 mg into the skin  once. as needed., Disp: , Rfl:    levocetirizine (XYZAL) 5 MG tablet, Take 1 tablet by mouth daily., Disp: , Rfl:    levothyroxine (SYNTHROID) 50 MCG tablet, TAKE 1 TABLET EVERY DAY ON EMPTY STOMACHWITH A GLASS OF WATER AT LEAST 30-60 MINBEFORE BREAKFAST, Disp: 90 tablet, Rfl: 2   Melatonin 10 MG TABS, Take 1 tablet by mouth at bedtime., Disp: , Rfl:    Omega-3 Fatty Acids (FISH OIL) 1200 MG CAPS, Take 1 capsule by mouth daily. , Disp: , Rfl:    omeprazole (PRILOSEC) 20 MG capsule, TAKE 2 CAPSULES BY MOUTH EVERY MORNING AND ONE IN THE IN THE EVENING, Disp: 270 capsule, Rfl: 2   ramipril (ALTACE) 10 MG capsule, TAKE 1 CAPSULE BY MOUTH EVERY DAY, Disp: 90 capsule, Rfl: 2   sertraline (ZOLOFT) 100 MG tablet, Take 1 tablet (100 mg total) by mouth daily., Disp: 90 tablet, Rfl: 3   triamterene-hydrochlorothiazide (MAXZIDE-25) 37.5-25 MG tablet, Take 0.5 tablets by mouth daily., Disp: 45 tablet, Rfl: 3   Multiple Vitamin (MULTIVITAMIN) tablet, Take 1 tablet by mouth daily.   (Patient not taking: Reported on 07/29/2020), Disp: , Rfl:    Observations/Objective: Blood pressure 120/70, weight 175 lb (79.4 kg), last menstrual period 09/18/1987.  Physical Exam  Physical Exam Constitutional:      General: The patient is not in acute distress. Pulmonary:     Effort: Pulmonary effort is normal. No respiratory distress.  Neurological:     Mental Status: The patient is alert and oriented to person, place, and time.  Psychiatric:        Mood and Affect: Mood normal.        Behavior: Behavior normal.   Assessment and Plan Acute bronchitis Neg COVID PCR test.  Worsening cough and wheeze, fever.. treat as acute bacterial bronchitis, but if not improving consider in person exam at urgent care or drive up testing at [facility] for flu, possible lung exam and referral for CXR.  treat with antibiotics, cough suppressant, rest, fluids, albuterol prn wheeze.  Will hold prednisone as it makes her feel  nauseous.   ER precautions given.      I discussed the assessment and treatment plan with the patient. The patient was provided an opportunity to ask questions and all were answered. The patient agreed with the plan and demonstrated an understanding of the instructions.   The patient was advised to call back or seek an in-person evaluation if the symptoms worsen or if the condition fails to improve as anticipated.  Total visit time 15 minutes, > 50% spent counseling and cordinating patients care.     Eliezer Lofts, MD

## 2020-07-29 NOTE — Telephone Encounter (Signed)
Noted  

## 2020-07-29 NOTE — Telephone Encounter (Signed)
I spoke with [name]; [name] at front desk said [name] could not do video visit and [name] wants to do phone visit; with the note from access nurse that can hear wheezing across the room [name] might need to go to UC for someone to eval, listen to her lungs and possible CXR. [name] said she called [facility] in [place] and they are not taking any more walk ins today. [name] said she does not feel like going and sitting and waiting to be seen. [name] wants to keep phone visit with DR Diona Browner today at 2:40. UC & ED precautions given and [name] voiced understanding.

## 2020-07-29 NOTE — Assessment & Plan Note (Signed)
Neg COVID PCR test.  Worsening cough and wheeze, fever.. treat as acute bacterial bronchitis, but if not improving consider in person exam at urgent care or drive up testing at [facility] for flu, possible lung exam and referral for CXR.  treat with antibiotics, cough suppressant, rest, fluids, albuterol prn wheeze.  Will hold prednisone as it makes her feel nauseous.   ER precautions given.

## 2020-08-19 DIAGNOSIS — J301 Allergic rhinitis due to pollen: Secondary | ICD-10-CM | POA: Diagnosis not present

## 2020-08-22 DIAGNOSIS — J301 Allergic rhinitis due to pollen: Secondary | ICD-10-CM | POA: Diagnosis not present

## 2020-09-29 ENCOUNTER — Other Ambulatory Visit: Payer: Self-pay | Admitting: Family Medicine

## 2020-09-29 NOTE — Telephone Encounter (Signed)
Name of Medication:Xanax 0.25 mg Name of [place] or Written Date and Quantity:07/04/20 #90 with 0 refills Last Office Visit and Type:07/29/20 Acute appt (04/13/20 CPE) Next Office Visit and Type:04/21/21 CPE Last Controlled Substance Agreement Date:none Last AXK:[id number]

## 2020-11-29 DIAGNOSIS — J301 Allergic rhinitis due to pollen: Secondary | ICD-10-CM | POA: Diagnosis not present

## 2020-12-02 ENCOUNTER — Other Ambulatory Visit: Payer: Self-pay | Admitting: Family Medicine

## 2020-12-05 DIAGNOSIS — J301 Allergic rhinitis due to pollen: Secondary | ICD-10-CM | POA: Diagnosis not present

## 2020-12-30 ENCOUNTER — Other Ambulatory Visit: Payer: Self-pay | Admitting: Family Medicine

## 2021-01-02 NOTE — Telephone Encounter (Signed)
Refill request Alprazolam Last refill 09/20/20 #90 Last office visit 07/29/20 acute Upcoming appointment 04/21/21

## 2021-01-26 ENCOUNTER — Other Ambulatory Visit: Payer: Self-pay | Admitting: Family Medicine

## 2021-02-04 ENCOUNTER — Other Ambulatory Visit: Payer: Self-pay | Admitting: Family Medicine

## 2021-02-23 ENCOUNTER — Telehealth: Payer: Self-pay

## 2021-02-23 NOTE — Telephone Encounter (Signed)
[facility] - Client TELEPHONE ADVICE RECORD [facility] Patient Name: [name] Gender: Female DOB: May 06, 1946 Age: 75 Y 9 M 14 D Return Phone Number: [phone] (Primary) Address: City/ State/ Zip: [place]  [address] Client [facility] - Client Client Site [place] MD Contact Type Call Who Is Calling Patient / Member / Family / Caregiver Call Type Triage / Clinical Relationship To Patient Self Return Phone Number [phone] (Primary) Chief Complaint ABDOMINAL PAIN - Severe and only in abdomen Reason for Call Symptomatic / Request for [place] states, having really bad severe lower abdominal pain. Getting worse. Office needs triage, only have 2 provider in office and totally booked today and tomorrow. Translation No Nurse Assessment Nurse: Jimmye Norman, RN, Whitney Date/Time (Eastern Time): 02/23/2021 9:04:33 AM Confirm and document reason for call. If symptomatic, describe symptoms. ---Caller states, having really bad severe lower abdominal pain. Symptoms have been present for a week now, and worsening, states she has pain when sitting or having a bowel movement. Does the patient have any new or worsening symptoms? ---Yes Will a triage be completed? ---Yes Related visit to physician within the last 2 weeks? ---No Does the PT have any chronic conditions? (i.e. diabetes, asthma, this includes High risk factors for pregnancy, etc.) ---Yes List chronic conditions. ---diabetes Is this a behavioral health or substance abuse call? ---No Guidelines Guideline Title Affirmed Question Affirmed Notes Nurse Date/Time (Eastern Time) Abdominal Pain - Female [1] MILDMODERATE pain AND [2] constant AND [3] present > 2 hours Jimmye Norman, RN, Whitney 02/23/2021 9:05:51 AM PLEASE NOTE: All timestamps contained within this report are  represented as [place] Standard Time. CONFIDENTIALTY NOTICE: This fax transmission is intended only for the addressee. It contains information that is legally privileged, confidential or otherwise protected from use or disclosure. If you are not the intended recipient, you are strictly prohibited from reviewing, disclosing, copying using or disseminating any of this information or taking any action in reliance on or regarding this information. If you have received this fax in error, please notify us immediately by telephone so that we can arrange for its return to [place]. Phone: [phone], Toll-Free: [phone], Fax: [phone] Page: 2 of 2 Call Id: [id number] [place]. Time Eilene Ghazi Time) Disposition Final User 02/23/2021 9:03:53 AM Send to Urgent Laneta Simmers 02/23/2021 9:09:41 AM Go to ED Now (or PCP triage) Yes Jimmye Norman, RN, Whitney Disposition Overriden: See HCP within 4 Hours (or PCP triage) Override Reason: Patient's symptoms need a higher level of care Caller Disagree/Comply Comply Caller Understands Yes PreDisposition InappropriateToAsk Care Advice Given Per Guideline * IF NO PCP (PRIMARY CARE PROVIDER) SECOND-LEVEL TRIAGE: You need to be seen within the next hour. Go to the [facility] at _____________ [place] as soon as you can. Comments User: Myriam Forehand, RN Date/Time Eilene Ghazi Time): 02/23/2021 9:11:27 AM RN upgraded triage outcome because office has already instructed they are fully booked for today and tomorrow. RN instructed caller to go to UC/ED. Caller is still requesting to make an appointment for Monday in case she still needs to be seen or isn't happy with care from urgent care. RN attempted to transfer to backline, no answer and after several rings went to a busy signal. RN instructed caller to call the office directly for appointment. Caller verbalized understanding. Referrals GO TO FACILITY UNDECIDED

## 2021-02-23 NOTE — Telephone Encounter (Signed)
Patient called stating that she has had abdominal pain/left side for the past 5 days, a little better today. This happens when she puts pressure like having a bowel movement. Bowels are not hard pretty normal. No blood in stool or toilet or on paper towel. No nausea, vomiting or feeling like she will pass out. Not a constant pain. Feels a little better today. She is eating. She did speak with access nurse and that note will be sent to [place] at some point today. She wanted to be seen at our office but no openings today or tomorrow.   Patient is scheduled with Dr Damita Dunnings on Monday 02/27/21-first available. Dr Glori Bickers did not have anything open then.  Patient did say if her pain gets worse or develops any other symptoms she will go to ER before that appointment. I advised patient that I would send a note to Dr Glori Bickers and Dr Damita Dunnings and will call her back if providers have other feedback/suggestions.

## 2021-02-23 NOTE — Telephone Encounter (Signed)
See note from Dr. Glori Bickers.  I will defer.  Thanks.

## 2021-02-23 NOTE — Telephone Encounter (Signed)
Aware, thanks  If symptoms worsen please go to UC or ER, do not wait until Monday

## 2021-02-26 ENCOUNTER — Other Ambulatory Visit: Payer: Self-pay | Admitting: Family Medicine

## 2021-02-27 ENCOUNTER — Other Ambulatory Visit: Payer: Self-pay

## 2021-02-27 ENCOUNTER — Encounter: Payer: Self-pay | Admitting: Family Medicine

## 2021-02-27 ENCOUNTER — Ambulatory Visit: Payer: Medicare PPO | Admitting: Family Medicine

## 2021-02-27 VITALS — BP 126/80 | HR 85 | Temp 97.8°F | Ht 64.0 in | Wt 181.0 lb

## 2021-02-27 DIAGNOSIS — R1032 Left lower quadrant pain: Secondary | ICD-10-CM

## 2021-02-27 LAB — POC URINALSYSI DIPSTICK (AUTOMATED)
Bilirubin, UA: NEGATIVE
Blood, UA: NEGATIVE
Glucose, UA: NEGATIVE
Ketones, UA: NEGATIVE
Leukocytes, UA: NEGATIVE
Nitrite, UA: NEGATIVE
Protein, UA: NEGATIVE
Spec Grav, UA: 1.015 (ref 1.010–1.025)
Urobilinogen, UA: 0.2 E.U./dL
pH, UA: 6 (ref 5.0–8.0)

## 2021-02-27 MED ORDER — AZELASTINE HCL 0.15 % NA SOLN: 1.0000 | Freq: Every day | NASAL | Status: AC

## 2021-02-27 NOTE — Patient Instructions (Signed)
We'll contact you about your urine results and go from there.  I'll update Dr. Glori Bickers.  Take care.  Glad to see you.

## 2021-02-27 NOTE — Progress Notes (Signed)
This visit occurred during the SARS-CoV-2 public health emergency.  Safety protocols were in place, including screening questions prior to the visit, additional usage of staff PPE, and extensive cleaning of exam room while observing appropriate contact time as indicated for disinfecting solutions.  Abdominal sx.  H/o hysterectomy, bladder suspension, rectocele repair, d/w pt.    Urinary incontinence worse recently.  H/o constipation. "Something gave in my stomach when I was straining" with a BM- this was about 1 month ago.  Pain at the time.  No urinary leak with cough or laugh.  She had lower abd pain as she is sitting down but not laying down.  She had pain with voiding and with BM.  She has urinary leakage with urgency.  No pain walking.    Pain is clearly better in the meantime.  She has narrower stools.  No blood in stool.  No black stools. No fevers.  No vomiting.  No nausea.    Abd pain is better but urinary incontinence prior to voiding is not better.    Meds, vitals, and allergies reviewed.  ROS: Per HPI unless specifically indicated in ROS section   GEN: nad, alert and oriented HEENT: ncat NECK: supple w/o LA CV: rrr.  PULM: ctab, no inc wob ABD: soft, +bs, not ttp.  EXT: no edema SKIN: no acute rash LLQ not ttp at time of exam.  No hernia.

## 2021-02-28 DIAGNOSIS — R1032 Left lower quadrant pain: Secondary | ICD-10-CM | POA: Insufficient documentation

## 2021-02-28 LAB — URINE CULTURE
MICRO NUMBER:: 12000096
SPECIMEN QUALITY:: ADEQUATE

## 2021-02-28 NOTE — Assessment & Plan Note (Signed)
With dysuria. Check u/a and ucx.  D/w PCP in the meantime.  No ominous sx.   If Ucx positive, will treat.  If ucx neg, then consider referral back to urology.   She could have laxity of prev bladder suspension, given procedure was decades ago.   I don't feel an abdominal wall hernia.  She could have muscle soreness/strain from prev episode straining for a BM.   If narrow stools persist, then may need GI evaluation but would await the above prior to GI referral.

## 2021-03-02 ENCOUNTER — Telehealth: Payer: Self-pay | Admitting: Family Medicine

## 2021-03-02 DIAGNOSIS — N3946 Mixed incontinence: Secondary | ICD-10-CM

## 2021-03-02 DIAGNOSIS — R1032 Left lower quadrant pain: Secondary | ICD-10-CM

## 2021-03-02 NOTE — Telephone Encounter (Signed)
-----   Message from [name], [place] sent at 03/02/2021 11:03 AM EDT ----- Pt agreed with urologist referral she would like to see Savannah Cowper MD with [name], she said she saw him in the past and he did bladder surgery on her before so he knows her history. I advise PCP will get referral going

## 2021-03-10 DIAGNOSIS — J301 Allergic rhinitis due to pollen: Secondary | ICD-10-CM | POA: Diagnosis not present

## 2021-03-16 DIAGNOSIS — J301 Allergic rhinitis due to pollen: Secondary | ICD-10-CM | POA: Diagnosis not present

## 2021-03-21 ENCOUNTER — Other Ambulatory Visit: Payer: Self-pay | Admitting: Family Medicine

## 2021-03-22 DIAGNOSIS — N39498 Other specified urinary incontinence: Secondary | ICD-10-CM | POA: Diagnosis not present

## 2021-03-22 DIAGNOSIS — R3915 Urgency of urination: Secondary | ICD-10-CM | POA: Diagnosis not present

## 2021-03-22 DIAGNOSIS — N952 Postmenopausal atrophic vaginitis: Secondary | ICD-10-CM | POA: Diagnosis not present

## 2021-03-31 ENCOUNTER — Other Ambulatory Visit: Payer: Self-pay | Admitting: Family Medicine

## 2021-03-31 NOTE — Telephone Encounter (Signed)
Name of Medication: Xanax 0.25 mg Name of Pharmacy: [facility] or Written Date and Quantity:01/02/21 #90 with 0 refills Last Office Visit and Type: 02/27/21 Acute abd pain Next Office Visit and Type: 04/21/21 CPE Last Controlled Substance Agreement Date: none Last [id number]

## 2021-04-13 DIAGNOSIS — Z800 Family history of malignant neoplasm of digestive organs: Secondary | ICD-10-CM | POA: Diagnosis not present

## 2021-04-13 DIAGNOSIS — R1032 Left lower quadrant pain: Secondary | ICD-10-CM | POA: Diagnosis not present

## 2021-04-13 DIAGNOSIS — R143 Flatulence: Secondary | ICD-10-CM | POA: Diagnosis not present

## 2021-04-13 DIAGNOSIS — R194 Change in bowel habit: Secondary | ICD-10-CM | POA: Diagnosis not present

## 2021-04-14 ENCOUNTER — Encounter: Payer: Self-pay | Admitting: *Deleted

## 2021-04-17 ENCOUNTER — Encounter
Admission: RE | Disposition: A | Discharge status: Home / Self Care | Payer: Self-pay | Source: Home / Self Care | Attending: Gastroenterology

## 2021-04-17 ENCOUNTER — Ambulatory Visit: Payer: Medicare PPO

## 2021-04-17 ENCOUNTER — Encounter: Payer: Self-pay | Admitting: *Deleted

## 2021-04-17 ENCOUNTER — Other Ambulatory Visit: Payer: Self-pay

## 2021-04-17 ENCOUNTER — Ambulatory Visit
Admission: RE | Admit: 2021-04-17 | Discharge: 2021-04-17 | Disposition: A | Discharge status: Home / Self Care | Payer: Medicare PPO | Attending: Gastroenterology | Admitting: Gastroenterology

## 2021-04-17 ENCOUNTER — Ambulatory Visit: Payer: Medicare PPO | Admitting: Certified Registered Nurse Anesthetist

## 2021-04-17 DIAGNOSIS — D123 Benign neoplasm of transverse colon: Secondary | ICD-10-CM | POA: Diagnosis not present

## 2021-04-17 DIAGNOSIS — K635 Polyp of colon: Secondary | ICD-10-CM | POA: Diagnosis not present

## 2021-04-17 DIAGNOSIS — Z800 Family history of malignant neoplasm of digestive organs: Secondary | ICD-10-CM | POA: Diagnosis not present

## 2021-04-17 DIAGNOSIS — Z1211 Encounter for screening for malignant neoplasm of colon: Secondary | ICD-10-CM | POA: Diagnosis not present

## 2021-04-17 DIAGNOSIS — Z880 Allergy status to penicillin: Secondary | ICD-10-CM | POA: Diagnosis not present

## 2021-04-17 DIAGNOSIS — I10 Essential (primary) hypertension: Secondary | ICD-10-CM | POA: Diagnosis not present

## 2021-04-17 DIAGNOSIS — Z887 Allergy status to serum and vaccine status: Secondary | ICD-10-CM | POA: Insufficient documentation

## 2021-04-17 DIAGNOSIS — D126 Benign neoplasm of colon, unspecified: Secondary | ICD-10-CM | POA: Diagnosis not present

## 2021-04-17 DIAGNOSIS — K5730 Diverticulosis of large intestine without perforation or abscess without bleeding: Secondary | ICD-10-CM | POA: Insufficient documentation

## 2021-04-17 DIAGNOSIS — R194 Change in bowel habit: Secondary | ICD-10-CM | POA: Diagnosis not present

## 2021-04-17 DIAGNOSIS — E039 Hypothyroidism, unspecified: Secondary | ICD-10-CM | POA: Diagnosis not present

## 2021-04-17 DIAGNOSIS — Z86010 Personal history of colonic polyps: Secondary | ICD-10-CM | POA: Diagnosis not present

## 2021-04-17 DIAGNOSIS — Z79899 Other long term (current) drug therapy: Secondary | ICD-10-CM | POA: Insufficient documentation

## 2021-04-17 DIAGNOSIS — Z79890 Hormone replacement therapy (postmenopausal): Secondary | ICD-10-CM | POA: Diagnosis not present

## 2021-04-17 DIAGNOSIS — Z888 Allergy status to other drugs, medicaments and biological substances status: Secondary | ICD-10-CM | POA: Insufficient documentation

## 2021-04-17 DIAGNOSIS — Z9889 Other specified postprocedural states: Secondary | ICD-10-CM | POA: Insufficient documentation

## 2021-04-17 HISTORY — PX: COLONOSCOPY WITH PROPOFOL: SHX5780

## 2021-04-17 SURGERY — COLONOSCOPY WITH PROPOFOL
Anesthesia: General

## 2021-04-17 MED ORDER — PROPOFOL 500 MG/50ML IV EMUL
INTRAVENOUS | Status: AC
  Filled 2021-04-17: qty 50

## 2021-04-17 MED ORDER — PROPOFOL 500 MG/50ML IV EMUL
INTRAVENOUS | Status: DC | PRN
  Administered 2021-04-17: 125 ug/kg/min via INTRAVENOUS

## 2021-04-17 MED ORDER — SODIUM CHLORIDE 0.9 % IV SOLN: INTRAVENOUS | Status: DC

## 2021-04-17 MED ORDER — PROPOFOL 10 MG/ML IV BOLUS
INTRAVENOUS | Status: AC
  Filled 2021-04-17: qty 20

## 2021-04-17 MED ORDER — PROPOFOL 10 MG/ML IV BOLUS
INTRAVENOUS | Status: DC | PRN
  Administered 2021-04-17 (×2): 30 mg via INTRAVENOUS
  Administered 2021-04-17: 20 mg via INTRAVENOUS

## 2021-04-17 MED ORDER — LIDOCAINE HCL (CARDIAC) PF 100 MG/5ML IV SOSY
PREFILLED_SYRINGE | INTRAVENOUS | Status: DC | PRN
  Administered 2021-04-17: 50 mg via INTRAVENOUS

## 2021-04-17 NOTE — Transfer of Care (Signed)
Immediate Anesthesia Transfer of Care Note  Patient: [name]  Procedure(s) Performed: COLONOSCOPY WITH PROPOFOL  Patient Location: PACU  Anesthesia Type:General  Level of Consciousness: awake, alert  and oriented  Airway & Oxygen Therapy: Patient Spontanous Breathing and Patient connected to nasal cannula oxygen  Post-op Assessment: Report given to RN and Post -op Vital signs reviewed and stable  Post vital signs: Reviewed and stable  Last Vitals:  Vitals Value Taken Time  BP    Temp    Pulse    Resp    SpO2      Last Pain:  Vitals:   04/17/21 1248  TempSrc: Temporal  PainSc: 0-No pain         Complications: No notable events documented.

## 2021-04-17 NOTE — Interval H&P Note (Signed)
History and Physical Interval Note:  04/17/2021 1:19 PM  [name]  has presented today for surgery, with the diagnosis of CHANGE IN BH LLQ PAIN.  The various methods of treatment have been discussed with the patient and family. After consideration of risks, benefits and other options for treatment, the patient has consented to  Procedure(s): COLONOSCOPY WITH PROPOFOL (N/A) as a surgical intervention.  The patient's history has been reviewed, patient examined, no change in status, stable for surgery.  I have reviewed the patient's chart and labs.  Questions were answered to the patient's satisfaction.     Lesly Rubenstein  Ok to proceed with colonoscopy

## 2021-04-17 NOTE — Anesthesia Preprocedure Evaluation (Signed)
Anesthesia Evaluation  Patient identified by MRN, date of birth, ID band Patient awake    Reviewed: Allergy & Precautions, NPO status , Patient's Chart, lab work & pertinent test results  History of Anesthesia Complications Negative for: history of anesthetic complications  Airway Mallampati: I  TM Distance: >3 FB Neck ROM: Full    Dental no notable dental hx.    Pulmonary neg pulmonary ROS, neg sleep apnea, neg COPD,    breath sounds clear to auscultation- rhonchi (-) wheezing      Cardiovascular Exercise Tolerance: Good hypertension, Pt. on medications (-) CAD and (-) Past MI  Rhythm:Regular Rate:Normal - Systolic murmurs and - Diastolic murmurs    Neuro/Psych PSYCHIATRIC DISORDERS Anxiety Depression negative neurological ROS     GI/Hepatic Neg liver ROS, GERD  ,  Endo/Other  diabetes, Well Controlled, Type 2  Renal/GU negative Renal ROS     Musculoskeletal  (+) Arthritis , Osteoarthritis,    Abdominal (+) + obese,   Peds  Hematology negative hematology ROS (+)   Anesthesia Other Findings Past Medical History: No date: Allergic rhinitis     Comment: gets allergy shot from Dr. Carlis Abbott No date: Anxiety No date: Arthritis No date: Diabetes mellitus without complication (HCC)     Comment: diet controlled No date: GERD (gastroesophageal reflux disease) No date: History of shingles     Comment: at age 89- mild No date: Hx of colonic polyp No date: Hyperglycemia No date: Hyperlipemia     Comment: Very high chol intol of statins-does not want               to check it No date: Hypertension No date: Panic disorder No date: Tremor     Comment: from anx No date: Urinary incontinence     Comment: mixed No date: Voice disorder   Reproductive/Obstetrics                             Anesthesia Physical  Anesthesia Plan  ASA: II  Anesthesia Plan: General   Post-op Pain Management:     Induction: Intravenous  PONV Risk Score and Plan: 1 and TIVA and Treatment may vary due to age or medical condition  Airway Management Planned: Natural Airway and Nasal Cannula  Additional Equipment:   Intra-op Plan:   Post-operative Plan:   Informed Consent: I have reviewed the patients History and Physical, chart, labs and discussed the procedure including the risks, benefits and alternatives for the proposed anesthesia with the patient or authorized representative who has indicated his/her understanding and acceptance.     Dental advisory given  Plan Discussed with: CRNA and Anesthesiologist  Anesthesia Plan Comments:         Anesthesia Quick Evaluation

## 2021-04-17 NOTE — H&P (Signed)
Outpatient short stay form Pre-procedure 04/17/2021 1:16 PM Savannah Miyamoto MD, MPH  Primary Physician: Dr. Glori Lane  Reason for visit:  Change in bowel habits  History of present illness:   75 y/o lady with history of hypothyroidism, hypertension, and hypertension here for colonoscopy for change in bowel habits. Notes her bowel movements are pencil thin. Father had colon cancer in his 58's. Her last colonoscopy was in 2018 with hyperplastic polyp. Several abdominal surgeries including rectocele repair. No blood thinners.    Current Facility-Administered Medications:    0.9 %  sodium chloride infusion, , Intravenous, Continuous, Cristina Ceniceros, Hilton Cork, MD, Last Rate: 20 mL/hr at 04/17/21 1307, Continued from Pre-op at 04/17/21 1307  Medications Prior to Admission  Medication Sig Dispense Refill Last Dose   ALPRAZolam (XANAX) 0.25 MG tablet TAKE 1 TABLET BY MOUTH AT BEDTIME AS NEEDED FOR ANXIETY 90 tablet 0 04/16/2021   amLODipine (NORVASC) 5 MG tablet Take 5 mg by mouth daily.   04/17/2021   Azelastine HCl (ASTEPRO) 0.15 % SOLN Place 1 spray into the nose at bedtime.   04/16/2021   Calcium Carbonate-Vitamin D (CALCIUM 600+D PO) Take 1 tablet by mouth 2 (two) times daily.    04/16/2021   Evolocumab 140 MG/ML SOAJ Inject into the skin.      levocetirizine (XYZAL) 5 MG tablet Take 1 tablet by mouth daily.   04/16/2021   levothyroxine (SYNTHROID) 50 MCG tablet TAKE 1 TABLET EVERY DAY ON EMPTY STOMACHWITH A GLASS OF WATER AT LEAST 30-60 MINBEFORE BREAKFAST 90 tablet 2 04/17/2021   Omega-3 Fatty Acids (FISH OIL) 1200 MG CAPS Take 1 capsule by mouth daily.    Past Week   omeprazole (PRILOSEC) 20 MG capsule TAKE 2 CAPSULES BY MOUTH EVERY MORNING AND ONE IN THE IN THE EVENING 270 capsule 0 04/16/2021   ramipril (ALTACE) 10 MG capsule TAKE 1 CAPSULE BY MOUTH EVERY DAY 90 capsule 0 04/17/2021   sertraline (ZOLOFT) 100 MG tablet TAKE 1 TABLET BY MOUTH DAILY 90 tablet 0 04/16/2021   triamterene-hydrochlorothiazide  (MAXZIDE-25) 37.5-25 MG tablet TAKE 1/2 TABLET BY MOUTH DAILY 45 tablet 0 04/16/2021   EPINEPHrine 0.3 mg/0.3 mL IJ SOAJ injection Inject 0.3 mg into the skin once. as needed.      Melatonin 10 MG TABS Take 1 tablet by mouth at bedtime.      Multiple Vitamin (MULTIVITAMIN) tablet Take 1 tablet by mouth daily. (Patient not taking: Reported on 02/27/2021)        Allergies  Allergen Reactions   Atorvastatin     REACTION: shoulder pain   Ezetimibe     REACTION: ?   Lisinopril     REACTION: muscle pain   Molds & Smuts    Penicillamine    Penicillins     REACTION: reaction not known Has patient had a PCN reaction causing immediate rash, facial/tongue/throat swelling, SOB or lightheadedness with hypotension: unknown Has patient had a PCN reaction causing severe rash involving mucus membranes or skin necrosis: unknown Has patient had a PCN reaction that required hospitalization unsure Has patient had a PCN reaction occurring within the last 10 years: no If all of the above answers are "NO", then may proceed with Cephalosporin use.   Pneumococcal Vaccine Swelling    REACTION: severe local reaction   Pneumococcal Vaccine Polyvalent     REACTION: severe local reaction   Pollen Extract    Rosuvastatin     REACTION: leg pain   Simvastatin     REACTION: muscle pain  Past Medical History:  Diagnosis Date   Allergic rhinitis    gets allergy shot from Dr. Carlis Abbott   Anxiety    Arthritis    Diabetes mellitus without complication ([place])    diet controlled   GERD (gastroesophageal reflux disease)    History of shingles    at age 57- mild   Hx of colonic polyp    Hyperglycemia    Hyperlipemia    Very high chol intol of statins-does not want to check it   Hypertension    Panic disorder    Tremor    from anx   Urinary incontinence    mixed   Voice disorder     Review of systems:  Otherwise negative.    Physical Exam  Gen: Alert, oriented. Appears stated age.  HEENT:  PERRLA. Lungs: No respiratory distress CV: RRR Abd: soft, benign, no masses Ext: No edema    Planned procedures: Proceed with colonoscopy. The patient understands the nature of the planned procedure, indications, risks, alternatives and potential complications including but not limited to bleeding, infection, perforation, damage to internal organs and possible oversedation/side effects from anesthesia. The patient agrees and gives consent to proceed.  Please refer to procedure notes for findings, recommendations and patient disposition/instructions.     Savannah Miyamoto MD, MPH Gastroenterology 04/17/2021  1:16 PM

## 2021-04-17 NOTE — Op Note (Addendum)
[facility]astroenter[facility] Patient Name: [name] Procedure Date: 04/17/2021 1:08 PM MRN: [id number] Account #: [id number] Date of Birth: 10-01-1945 Admit Type: Outpatient Age: 75 Room: [facility] ENDO ROOM 3 Gender: Female Note Status: Supervisor Override Procedure:             Colonoscopy Indications:           Change in bowel habits Providers:             Andrey Farmer MD, MD Medicines:             Monitored Anesthesia Care Complications:         No immediate complications. Estimated blood loss:                         Minimal. Procedure:             Pre-Anesthesia Assessment:                        - Prior to the procedure, a History and Physical was                         performed, and patient medications and allergies were                         reviewed. The patient is competent. The risks and                         benefits of the procedure and the sedation options and                         risks were discussed with the patient. All questions                         were answered and informed consent was obtained.                         Patient identification and proposed procedure were                         verified by the physician, the nurse, the anesthetist                         and the technician in the endoscopy suite. Mental                         Status Examination: alert and oriented. Airway                         Examination: normal oropharyngeal airway and neck                         mobility. Respiratory Examination: clear to                         auscultation. CV Examination: normal. Prophylactic                         Antibiotics: The patient does not require prophylactic  antibiotics. Prior Anticoagulants: The patient has                         taken no previous anticoagulant or antiplatelet                         agents. ASA Grade Assessment: II - A patient with mild                         systemic  disease. After reviewing the risks and                         benefits, the patient was deemed in satisfactory                         condition to undergo the procedure. The anesthesia                         plan was to use monitored anesthesia care (MAC).                         Immediately prior to administration of medications,                         the patient was re-assessed for adequacy to receive                         sedatives. The heart rate, respiratory rate, oxygen                         saturations, blood pressure, adequacy of pulmonary                         ventilation, and response to care were monitored                         throughout the procedure. The physical status of the                         patient was re-assessed after the procedure.                        After obtaining informed consent, the colonoscope was                         passed under direct vision. Throughout the procedure,                         the patient's blood pressure, pulse, and oxygen                         saturations were monitored continuously. The                         Colonoscope was introduced through the anus and                         advanced to the the terminal ileum. The colonoscopy  was somewhat difficult due to restricted mobility of                         the colon. The patient tolerated the procedure well.                         The quality of the bowel preparation was adequate to                         identify polyps. Findings:      The perianal and digital rectal examinations were normal.      The terminal ileum appeared normal.      A 1 mm polyp was found in the ascending colon. The polyp was sessile.       The polyp was removed with a jumbo cold forceps. Resection and retrieval       were complete. Estimated blood loss was minimal.      A 1 mm polyp was found in the transverse colon. The polyp was sessile.       The polyp was  removed with a jumbo cold forceps. Resection and retrieval       were complete.      A 2 mm polyp was found in the transverse colon. The polyp was sessile.       The polyp was removed with a cold snare. Resection and retrieval were       complete. Estimated blood loss was minimal.      Multiple small-mouthed diverticula were found in the sigmoid colon.      The exam was otherwise without abnormality on direct and retroflexion       views. Impression:            - The examined portion of the ileum was normal.                        - One 1 mm polyp in the ascending colon, removed with                         a jumbo cold forceps. Resected and retrieved.                        - One 1 mm polyp in the transverse colon, removed with                         a jumbo cold forceps. Resected and retrieved.                        - One 2 mm polyp in the transverse colon, removed with                         a cold snare. Resected and retrieved.                        - Diverticulosis in the sigmoid colon.                        - The examination was otherwise normal on direct and  retroflexion views. Recommendation:        - Discharge patient to home.                        - Resume previous diet.                        - Continue present medications.                        - Await pathology results.                        - Repeat colonoscopy for surveillance based on                         pathology results.                        - Perform CT scan (computed tomography) of the abdomen                         with contrast at appointment to be scheduled. Procedure Code(s):     --- Professional ---                        [phone], Colonoscopy, flexible; with removal of                         tumor(s), polyp(s), or other lesion(s) by snare                         technique                        45380, 37, Colonoscopy, flexible; with biopsy, single                         or  multiple Diagnosis Code(s):     --- Professional ---                        K63.5, Polyp of colon                        R19.4, Change in bowel habit                        K57.30, Diverticulosis of large intestine without                         perforation or abscess without bleeding CPT copyright 2019 American Medical Association. All rights reserved. The codes documented in this report are preliminary and upon coder review may  be revised to meet current compliance requirements. Andrey Farmer MD, MD 04/17/2021 1:56:32 PM Number of Addenda: 0 Note Initiated On: 04/17/2021 1:08 PM Scope Withdrawal Time: 0 hours 12 minutes 21 seconds  Total Procedure Duration: 0 hours 21 minutes 31 seconds  Estimated Blood Loss:  Estimated blood loss was minimal.      [facility]

## 2021-04-18 ENCOUNTER — Encounter: Payer: Self-pay | Admitting: Gastroenterology

## 2021-04-18 ENCOUNTER — Other Ambulatory Visit: Payer: Medicare PPO

## 2021-04-18 ENCOUNTER — Telehealth: Payer: Self-pay | Admitting: Family Medicine

## 2021-04-18 DIAGNOSIS — E7800 Pure hypercholesterolemia, unspecified: Secondary | ICD-10-CM

## 2021-04-18 DIAGNOSIS — E039 Hypothyroidism, unspecified: Secondary | ICD-10-CM

## 2021-04-18 DIAGNOSIS — I10 Essential (primary) hypertension: Secondary | ICD-10-CM

## 2021-04-18 DIAGNOSIS — R7303 Prediabetes: Secondary | ICD-10-CM

## 2021-04-18 LAB — SURGICAL PATHOLOGY

## 2021-04-18 NOTE — Telephone Encounter (Signed)
-----   Message from Cloyd Stagers, RT sent at 04/03/2021  9:57 AM EDT ----- Regarding: Lab Orders for Wednesday 8.3.2022 Please place lab orders for Wednesday 8.3.2022, office visit for physical on Friday 8.5.2022 Thank you, Dyke Maes RT(R)

## 2021-04-18 NOTE — Anesthesia Postprocedure Evaluation (Signed)
Anesthesia Post Note  Patient: [name]  Procedure(s) Performed: COLONOSCOPY WITH PROPOFOL  Patient location during evaluation: Endoscopy Anesthesia Type: General Level of consciousness: awake and alert Pain management: pain level controlled Vital Signs Assessment: post-procedure vital signs reviewed and stable Respiratory status: spontaneous breathing, nonlabored ventilation and respiratory function stable Cardiovascular status: blood pressure returned to baseline and stable Postop Assessment: no apparent nausea or vomiting Anesthetic complications: no   No notable events documented.   Last Vitals:  Vitals:   04/17/21 1412 04/17/21 1422  BP: 114/69 127/68  Pulse: 68 67  Resp: 18 (!) 23  Temp:    SpO2: 95% 97%    Last Pain:  Vitals:   04/18/21 0804  TempSrc:   PainSc: 0-No pain                 [place] Ouch

## 2021-04-19 ENCOUNTER — Other Ambulatory Visit: Payer: Self-pay

## 2021-04-19 ENCOUNTER — Other Ambulatory Visit (INDEPENDENT_AMBULATORY_CARE_PROVIDER_SITE_OTHER): Payer: Medicare PPO

## 2021-04-19 DIAGNOSIS — R7303 Prediabetes: Secondary | ICD-10-CM

## 2021-04-19 DIAGNOSIS — E7800 Pure hypercholesterolemia, unspecified: Secondary | ICD-10-CM

## 2021-04-19 DIAGNOSIS — I10 Essential (primary) hypertension: Secondary | ICD-10-CM

## 2021-04-19 DIAGNOSIS — E039 Hypothyroidism, unspecified: Secondary | ICD-10-CM | POA: Diagnosis not present

## 2021-04-19 DIAGNOSIS — N3941 Urge incontinence: Secondary | ICD-10-CM | POA: Diagnosis not present

## 2021-04-19 DIAGNOSIS — N952 Postmenopausal atrophic vaginitis: Secondary | ICD-10-CM | POA: Diagnosis not present

## 2021-04-19 LAB — LIPID PANEL
Cholesterol: 327 mg/dL — ABNORMAL HIGH (ref 0–200)
HDL: 42.4 mg/dL (ref >39.00)
LDL Cholesterol: 258 mg/dL — ABNORMAL HIGH (ref 0–99)
NonHDL: 285.09
Total CHOL/HDL Ratio: 8
Triglycerides: 133 mg/dL (ref 0.0–149.0)
VLDL: 26.6 mg/dL (ref 0.0–40.0)

## 2021-04-19 LAB — CBC WITH DIFFERENTIAL/PLATELET
Basophils Absolute: 0.1 10*3/uL (ref 0.0–0.1)
Basophils Relative: 1 % (ref 0.0–3.0)
Eosinophils Absolute: 0.2 10*3/uL (ref 0.0–0.7)
Eosinophils Relative: 2.5 % (ref 0.0–5.0)
HCT: 41.8 % (ref 36.0–46.0)
Hemoglobin: 14.2 g/dL (ref 12.0–15.0)
Lymphocytes Relative: 32.8 % (ref 12.0–46.0)
Lymphs Abs: 2.6 10*3/uL (ref 0.7–4.0)
MCHC: 33.8 g/dL (ref 30.0–36.0)
MCV: 85 fl (ref 78.0–100.0)
Monocytes Absolute: 0.8 10*3/uL (ref 0.1–1.0)
Monocytes Relative: 9.9 % (ref 3.0–12.0)
Neutro Abs: 4.2 10*3/uL (ref 1.4–7.7)
Neutrophils Relative %: 53.8 % (ref 43.0–77.0)
Platelets: 269 10*3/uL (ref 150.0–400.0)
RBC: 4.92 Mil/uL (ref 3.87–5.11)
RDW: 13.9 % (ref 11.5–15.5)
WBC: 7.9 10*3/uL (ref 4.0–10.5)

## 2021-04-19 LAB — COMPREHENSIVE METABOLIC PANEL
ALT: 12 U/L (ref 0–35)
AST: 14 U/L (ref 0–37)
Albumin: 4 g/dL (ref 3.5–5.2)
Alkaline Phosphatase: 74 U/L (ref 39–117)
BUN: 20 mg/dL (ref 6–23)
CO2: 25 mEq/L (ref 19–32)
Calcium: 9 mg/dL (ref 8.4–10.5)
Chloride: 104 mEq/L (ref 96–112)
Creatinine, Ser: 0.94 mg/dL (ref 0.40–1.20)
GFR: 59.6 mL/min — ABNORMAL LOW (ref >60.00)
Glucose, Bld: 120 mg/dL — ABNORMAL HIGH (ref 70–99)
Potassium: 4.1 mEq/L (ref 3.5–5.1)
Sodium: 139 mEq/L (ref 135–145)
Total Bilirubin: 0.5 mg/dL (ref 0.2–1.2)
Total Protein: 6.2 g/dL (ref 6.0–8.3)

## 2021-04-19 LAB — HEMOGLOBIN A1C: Hgb A1c MFr Bld: 6.5 % (ref 4.6–6.5)

## 2021-04-19 LAB — TSH: TSH: 4.11 u[IU]/mL (ref 0.35–5.50)

## 2021-04-20 DIAGNOSIS — J301 Allergic rhinitis due to pollen: Secondary | ICD-10-CM | POA: Diagnosis not present

## 2021-04-20 DIAGNOSIS — J383 Other diseases of vocal cords: Secondary | ICD-10-CM | POA: Diagnosis not present

## 2021-04-21 ENCOUNTER — Encounter: Payer: Self-pay | Admitting: Family Medicine

## 2021-04-21 ENCOUNTER — Other Ambulatory Visit: Payer: Self-pay

## 2021-04-21 ENCOUNTER — Ambulatory Visit (INDEPENDENT_AMBULATORY_CARE_PROVIDER_SITE_OTHER): Payer: Medicare PPO | Admitting: Family Medicine

## 2021-04-21 VITALS — BP 118/76 | HR 88 | Temp 98.2°F | Ht 63.5 in | Wt 179.5 lb

## 2021-04-21 DIAGNOSIS — E2839 Other primary ovarian failure: Secondary | ICD-10-CM

## 2021-04-21 DIAGNOSIS — K219 Gastro-esophageal reflux disease without esophagitis: Secondary | ICD-10-CM | POA: Diagnosis not present

## 2021-04-21 DIAGNOSIS — R7303 Prediabetes: Secondary | ICD-10-CM

## 2021-04-21 DIAGNOSIS — F411 Generalized anxiety disorder: Secondary | ICD-10-CM

## 2021-04-21 DIAGNOSIS — E039 Hypothyroidism, unspecified: Secondary | ICD-10-CM

## 2021-04-21 DIAGNOSIS — F32A Depression, unspecified: Secondary | ICD-10-CM

## 2021-04-21 DIAGNOSIS — Z0000 Encounter for general adult medical examination without abnormal findings: Secondary | ICD-10-CM | POA: Diagnosis not present

## 2021-04-21 DIAGNOSIS — E7800 Pure hypercholesterolemia, unspecified: Secondary | ICD-10-CM | POA: Diagnosis not present

## 2021-04-21 DIAGNOSIS — F320 Major depressive disorder, single episode, mild: Secondary | ICD-10-CM | POA: Diagnosis not present

## 2021-04-21 DIAGNOSIS — I10 Essential (primary) hypertension: Secondary | ICD-10-CM

## 2021-04-21 DIAGNOSIS — M85859 Other specified disorders of bone density and structure, unspecified thigh: Secondary | ICD-10-CM | POA: Diagnosis not present

## 2021-04-21 DIAGNOSIS — Z1231 Encounter for screening mammogram for malignant neoplasm of breast: Secondary | ICD-10-CM | POA: Insufficient documentation

## 2021-04-21 NOTE — Progress Notes (Signed)
Subjective:    Patient ID: [name], female    DOB: 1945-11-13, 75 y.o.   MRN: [id number]  This visit occurred during the SARS-CoV-2 public health emergency.  Safety protocols were in place, including screening questions prior to the visit, additional usage of staff PPE, and extensive cleaning of exam room while observing appropriate contact time as indicated for disinfecting solutions.   HPI Pt presents for amw and health mt exam with review of chronic medical problems   I have personally reviewed the Medicare Annual Wellness questionnaire and have noted 1. The patient's medical and social history 2. Their use of alcohol, tobacco or illicit drugs 3. Their current medications and supplements 4. The patient's functional ability including ADL's, fall risks, home safety risks and hearing or visual             impairment. 5. Diet and physical activities 6. Evidence for depression or mood disorders  The patients weight, height, BMI have been recorded in the chart and visual acuity is per eye clinic.  I have made referrals, counseling and provided education to the patient based review of the above and I have provided the pt with a written personalized care plan for preventive services. Reviewed and updated provider list, see scanned forms.  See scanned forms.  Routine anticipatory guidance given to patient.  See health maintenance. Colon cancer screening colonoscopy 8/22 Monday (did better with 2 day prep) Breast cancer screening  mammogram 10/21 Self breast exam -no lumps  Flu vaccine-fall Tetanus vaccine 1/10 Td , postponed for in Zoster vaccine-had shingrix Covid immunized Pna vaccine-allergic/cannot take  Eye exam due in October  Dexa 10/19 osteopenia , due for dexa  Falls-none Fractures-none  Supplements- ca and D  Exercise - not lately/was not well  Walking and stationary bike when she feels better   Advance directive- has this up to date  Cognitive function addressed-  see scanned forms- and if abnormal then additional documentation follows.   No problems  Misplaces occ thing /walks into a room and forgets why Nothing more  Very sharp overall   PMH and SH reviewed  Meds, vitals, and allergies reviewed.   ROS: See HPI.  Otherwise negative.    Weight : Wt Readings from Last 3 Encounters:  04/21/21 179 lb 8 oz (81.4 kg)  04/17/21 177 lb (80.3 kg)  02/27/21 181 lb (82.1 kg)   31.30 kg/m Eating -less sweets and too many carbs  Exercise-less   Hearing/vision: Hearing Screening   '500Hz'$  '1000Hz'$  '2000Hz'$  '4000Hz'$   Right ear '20 20 20 20  '$ Left ear 40 40 40 40   Vision Screening   Right eye Left eye Both eyes  Without correction     With correction '20/25 20/25 20/25 '$  Hearing is better in R  Has a tube in L ear- severe ear inf as a child  No problems  Vision screen is good   Care team: Averyana Pillars-pcp Vaught- ENT Rudene Christians- ortho  [facility] - Eliberto Ivory   HTN bp is stable today  No cp or palpitations or headaches or edema  No side effects to medicines  BP Readings from Last 3 Encounters:  04/17/21 127/68  02/27/21 126/80  07/29/20 120/70     Pulse Readings from Last 3 Encounters:  04/21/21 88  04/17/21 67  02/27/21 85   Altace 10 mg daily  Triam hct 37.5-25 mg 1/2 pill daily  Amlodinpine 5 mg daily   GERD Takes omeprazole 20 mg   Hypothyroidism  Pt has no clinical changes No change in energy level/ hair or skin/ edema and no tremor Lab Results  Component Value Date   TSH 4.11 04/19/2021    Levothyroxine 50 mcg daily   GAD and mild depression  No longer needs psychiatry  Zoloft 100 mg daily  Xanax 0.25 qhs prn   Hyplerlipidemia Lab Results  Component Value Date   CHOL 327 (H) 04/19/2021   CHOL 359 (H) 04/07/2020   CHOL 355 (H) 04/03/2019   Lab Results  Component Value Date   HDL 42.40 04/19/2021   HDL 46.50 04/07/2020   HDL 44.20 04/03/2019   Lab Results  Component Value Date   LDLCALC 258 (H) 04/19/2021    LDLCALC 286 (H) 04/07/2020   LDLCALC 287 (H) 04/03/2019   Lab Results  Component Value Date   TRIG 133.0 04/19/2021   TRIG 132.0 04/07/2020   TRIG 118.0 04/03/2019   Lab Results  Component Value Date   CHOLHDL 8 04/19/2021   CHOLHDL 8 04/07/2020   CHOLHDL 8 04/03/2019   Lab Results  Component Value Date   LDLDIRECT 242.8 01/15/2012   LDLDIRECT 237.0 12/18/2010   LDLDIRECT 227.8 09/20/2009   Cannot tolerate statins or zetia  Has been to lipid clinic  Cannot afford pcyk9 inhibitior Does not want to return right now  Eats well  No red meat    Prediabetes Lab Results  Component Value Date   HGBA1C 6.5 04/19/2021  Up from 6.3 Too many carbs Does choose whole grain pasta White potatoes  Salty snacks  Not exercising    Gemtessa 75 mg for incontinence  Also estrogen cream for atrophic vaginitis   Other labs Lab Results  Component Value Date   CREATININE 0.94 04/19/2021   BUN 20 04/19/2021   NA 139 04/19/2021   K 4.1 04/19/2021   CL 104 04/19/2021   CO2 25 04/19/2021   Lab Results  Component Value Date   ALT 12 04/19/2021   AST 14 04/19/2021   ALKPHOS 74 04/19/2021   BILITOT 0.5 04/19/2021    Lab Results  Component Value Date   WBC 7.9 04/19/2021   HGB 14.2 04/19/2021   HCT 41.8 04/19/2021   MCV 85.0 04/19/2021   PLT 269.0 04/19/2021    Patient Active Problem List   Diagnosis Date Noted   Encounter for screening mammogram for breast cancer 04/21/2021   LLQ abdominal pain 02/28/2021   Chest wall pain 04/23/2019   Osteopenia 02/13/2016   Routine general medical examination at a health care facility 01/23/2016   Estrogen deficiency 01/23/2016   Hypothyroid 01/23/2016   Mild depression ([place]) 02/21/2015   H/O diabetes mellitus 01/04/2015   H/O arthritis 01/04/2015   History of hay fever 01/04/2015   Encounter for Medicare annual wellness exam 01/14/2013   Obesity (BMI 30-39.9) 01/15/2012   MENOPAUSAL SYNDROME 10/04/2008   Prediabetes 12/02/2007    TREMOR 12/02/2007   Hyperlipidemia 01/28/2007   Generalized anxiety disorder 01/28/2007   Essential hypertension 01/28/2007   GERD 01/28/2007   URINARY INCONTINENCE 01/28/2007   COLONIC POLYPS, HX OF 01/28/2007   HERPES ZOSTER 01/20/2007   DYSPHONIA 01/20/2007   COUGH, CHRONIC 01/20/2007   ALLERGY 01/20/2007   Past Medical History:  Diagnosis Date   Allergic rhinitis    gets allergy shot from Dr. Carlis Abbott   Anxiety    Arthritis    Diabetes mellitus without complication ([place])    diet controlled   GERD (gastroesophageal reflux disease)    History of shingles  at age 45- mild   Hx of colonic polyp    Hyperglycemia    Hyperlipemia    Very high chol intol of statins-does not want to check it   Hypertension    Panic disorder    Tremor    from anx   Urinary incontinence    mixed   Voice disorder    Past Surgical History:  Procedure Laterality Date   ABDOMINAL HYSTERECTOMY     BLADDER SUSPENSION     COLONOSCOPY WITH PROPOFOL N/A 12/24/2016   Procedure: COLONOSCOPY WITH PROPOFOL;  Surgeon: Manya Silvas, MD;  Location: [facility];  Service: Endoscopy;  Laterality: N/A;   COLONOSCOPY WITH PROPOFOL N/A 04/17/2021   Procedure: COLONOSCOPY WITH PROPOFOL;  Surgeon: Lesly Rubenstein, MD;  Location: [facility] ENDOSCOPY;  Service: Endoscopy;  Laterality: N/A;   ESOPHAGOGASTRODUODENOSCOPY (EGD) WITH PROPOFOL N/A 12/24/2016   Procedure: ESOPHAGOGASTRODUODENOSCOPY (EGD) WITH PROPOFOL;  Surgeon: Manya Silvas, MD;  Location: [facility] ENDOSCOPY;  Service: Endoscopy;  Laterality: N/A;   EYE SURGERY Bilateral 2000   eye lens replacement   HAND SURGERY Left 2016   left long trigger finger release   JOINT REPLACEMENT     knee replacement - march 15   KNEE ARTHROSCOPY Left 04/26/2016   Procedure: ARTHROSCOPY KNEE;  Surgeon: Hessie Knows, MD;  Location: [facility] OR[facility];  Service: Orthopedics;  Laterality: Left;   MEDIAL PARTIAL KNEE REPLACEMENT Left 2016   NASAL SINUS SURGERY     PARTIAL  HYSTERECTOMY     with fibroid   RECTOCELE REPAIR     SYNOVECTOMY Left 04/26/2016   Procedure: PARTIAL SYNOVECTOMY;  Surgeon: Hessie Knows, MD;  Location: [facility] OR[facility];  Service: Orthopedics;  Laterality: Left;   TONSILLECTOMY     Social History   Tobacco Use   Smoking status: Never   Smokeless tobacco: Never  Vaping Use   Vaping Use: Never used  Substance Use Topics   Alcohol use: No    Alcohol/week: 0.0 standard drinks   Drug use: No   Family History  Problem Relation Age of Onset   Diabetes Mother    Hyperlipidemia Mother    Hypertension Mother    Osteoarthritis Mother    Cancer Father        Colon   Leukemia Sister    Syncope episode Daughter    Breast cancer Neg Hx    Allergies  Allergen Reactions   Atorvastatin     REACTION: shoulder pain   Ezetimibe     REACTION: ?   Lisinopril     REACTION: muscle pain   Molds & Smuts    Penicillamine    Penicillins     REACTION: reaction not known Has patient had a PCN reaction causing immediate rash, facial/tongue/throat swelling, SOB or lightheadedness with hypotension: unknown Has patient had a PCN reaction causing severe rash involving mucus membranes or skin necrosis: unknown Has patient had a PCN reaction that required hospitalization unsure Has patient had a PCN reaction occurring within the last 10 years: no If all of the above answers are "NO", then may proceed with Cephalosporin use.   Pneumococcal Vaccine Swelling    REACTION: severe local reaction   Pneumococcal Vaccine Polyvalent     REACTION: severe local reaction   Pollen Extract    Rosuvastatin     REACTION: leg pain   Simvastatin     REACTION: muscle pain   Current Outpatient Medications on File Prior to Visit  Medication Sig Dispense Refill   ALPRAZolam Duanne Moron)  0.25 MG tablet TAKE 1 TABLET BY MOUTH AT BEDTIME AS NEEDED FOR ANXIETY 90 tablet 0   amLODipine (NORVASC) 5 MG tablet Take 5 mg by mouth daily.     Azelastine HCl (ASTEPRO) 0.15 % SOLN Place 1  spray into the nose at bedtime.     Calcium Carbonate-Vitamin D (CALCIUM 600+D PO) Take 1 tablet by mouth 2 (two) times daily.      EPINEPHrine 0.3 mg/0.3 mL IJ SOAJ injection Inject 0.3 mg into the skin once. as needed.     levocetirizine (XYZAL) 5 MG tablet Take 1 tablet by mouth daily.     levothyroxine (SYNTHROID) 50 MCG tablet TAKE 1 TABLET EVERY DAY ON EMPTY STOMACHWITH A GLASS OF WATER AT LEAST 30-60 MINBEFORE BREAKFAST 90 tablet 2   Melatonin 10 MG TABS Take 1 tablet by mouth at bedtime.     Multiple Vitamin (MULTIVITAMIN) tablet Take 1 tablet by mouth daily. (Patient not taking: Reported on 02/27/2021)     Omega-3 Fatty Acids (FISH OIL) 1200 MG CAPS Take 1 capsule by mouth daily.      omeprazole (PRILOSEC) 20 MG capsule TAKE 2 CAPSULES BY MOUTH EVERY MORNING AND ONE IN THE IN THE EVENING 270 capsule 0   ramipril (ALTACE) 10 MG capsule TAKE 1 CAPSULE BY MOUTH EVERY DAY 90 capsule 0   sertraline (ZOLOFT) 100 MG tablet TAKE 1 TABLET BY MOUTH DAILY 90 tablet 0   triamterene-hydrochlorothiazide (MAXZIDE-25) 37.5-25 MG tablet TAKE 1/2 TABLET BY MOUTH DAILY 45 tablet 0   No current facility-administered medications on file prior to visit.     Review of Systems  Constitutional:  Negative for activity change, appetite change, fatigue, fever and unexpected weight change.  HENT:  Negative for congestion, ear pain, rhinorrhea, sinus pressure and sore throat.   Eyes:  Negative for pain, redness and visual disturbance.  Respiratory:  Negative for cough, shortness of breath and wheezing.   Cardiovascular:  Negative for chest pain and palpitations.  Gastrointestinal:  Negative for abdominal pain, blood in stool, constipation and diarrhea.  Endocrine: Negative for polydipsia and polyuria.  Genitourinary:  Negative for dysuria, frequency and urgency.  Musculoskeletal:  Positive for back pain. Negative for arthralgias and myalgias.  Skin:  Negative for pallor and rash.  Allergic/Immunologic: Negative  for environmental allergies.  Neurological:  Negative for dizziness, syncope and headaches.  Hematological:  Negative for adenopathy. Does not bruise/bleed easily.  Psychiatric/Behavioral:  Negative for decreased concentration and dysphoric mood. The patient is not nervous/anxious.       Objective:   Physical Exam Constitutional:      General: She is not in acute distress.    Appearance: Normal appearance. She is well-developed. She is obese. She is not ill-appearing or diaphoretic.  HENT:     Head: Normocephalic and atraumatic.     Right Ear: Tympanic membrane, ear canal and external ear normal.     Left Ear: Tympanic membrane, ear canal and external ear normal.     Nose: Nose normal. No congestion.     Mouth/Throat:     Mouth: Mucous membranes are moist.     Pharynx: Oropharynx is clear. No posterior oropharyngeal erythema.  Eyes:     General: No scleral icterus.    Extraocular Movements: Extraocular movements intact.     Conjunctiva/sclera: Conjunctivae normal.     Pupils: Pupils are equal, round, and reactive to light.  Neck:     Thyroid: No thyromegaly.     Vascular: No carotid bruit or  JVD.  Cardiovascular:     Rate and Rhythm: Normal rate and regular rhythm.     Pulses: Normal pulses.     Heart sounds: Normal heart sounds.    No gallop.  Pulmonary:     Effort: Pulmonary effort is normal. No respiratory distress.     Breath sounds: Normal breath sounds. No wheezing.     Comments: Good air exch Chest:     Chest wall: No tenderness.  Abdominal:     General: Bowel sounds are normal. There is no distension or abdominal bruit.     Palpations: Abdomen is soft. There is no mass.     Tenderness: There is no abdominal tenderness.     Hernia: No hernia is present.  Genitourinary:    Comments: Breast exam: No mass, nodules, thickening, tenderness, bulging, retraction, inflamation, nipple discharge or skin changes noted.  No axillary or clavicular LA.     Musculoskeletal:         General: No tenderness. Normal range of motion.     Cervical back: Normal range of motion and neck supple. No rigidity. No muscular tenderness.     Right lower leg: No edema.     Left lower leg: No edema.  Lymphadenopathy:     Cervical: No cervical adenopathy.  Skin:    General: Skin is warm and dry.     Coloration: Skin is not pale.     Findings: No erythema or rash.     Comments: Fair  Some sks  Neurological:     Mental Status: She is alert. Mental status is at baseline.     Cranial Nerves: No cranial nerve deficit.     Motor: No abnormal muscle tone.     Coordination: Coordination normal.     Gait: Gait normal.     Deep Tendon Reflexes: Reflexes are normal and symmetric. Reflexes normal.  Psychiatric:        Mood and Affect: Mood normal.        Cognition and Memory: Cognition and memory normal.          Assessment & Plan:   Problem List Items Addressed This Visit       Cardiovascular and Mediastinum   Essential hypertension    bp in fair control at this time  BP Readings from Last 1 Encounters:  04/21/21 118/76  No changes needed Most recent labs reviewed  Disc lifstyle change with low sodium diet and exercise  Plan to continue  altace 10 mg daily  triam hct 37.5-25 mg 1/2 daily  Amlodipine 5 mg daily          Digestive   GERD    Symptoms are controlled with 20 mg daily           Endocrine   Hypothyroid    Hypothyroidism  Pt has no clinical changes No change in energy level/ hair or skin/ edema and no tremor Lab Results  Component Value Date   TSH 4.11 04/19/2021     Plan to continue levothyroxine 50 mcg daily        Musculoskeletal and Integument   Osteopenia    dexa 10/19 -ordered dexa /pt will schedule  No falls or fractures Taking ca and D  Enc more exercise when feeling up to it   Disc need for calcium/ vitamin D/ wt bearing exercise and bone density test every 2 y to monitor Disc safety/ fracture risk in detail  Other   Prediabetes    Lab Results  Component Value Date   HGBA1C 6.5 04/19/2021  This is up from 6.3 Cut off for DM-pt aware  Eating too many carbs Discussed low glycemic diet  Plan better eating and f/u in 3 mo       Hyperlipidemia    Disc goals for lipids and reasons to control them Rev last labs with pt Rev low sat fat diet in detail Does not tolerate statins or zetia at all  Has been to the lipid clinic Still cannot afford pcyk9 inhibitor  Very good diet  Declines another lipid referral at this time  Worrisome LDL of 258 (this is down from 286 however so commended)       Generalized anxiety disorder    GAD and mild depression are stable Reviewed stressors/ coping techniques/symptoms/ support sources/ tx options and side effects in detail today No longer sees psychiatry  We oversee zoloft 100 mg daily and xanax 0.25 mg qhs prn        Encounter for Medicare annual wellness exam - Primary    Reviewed health habits including diet and exercise and skin cancer prevention Reviewed appropriate screening tests for age  Also reviewed health mt list, fam hx and immunization status , as well as social and family history   See HPI Las reviewed  Colonoscopy is up to date  Mammogram due in October  Tetanus postponed for insurance covid immunized Allergic to pneumonvax  Adv directive is up to date No cognitive concerns -very sharp Reviewed hearing screen-pt has tube in L ear baseline Nl vision screen Care team updated  Mental health is stable        Mild depression ([place])   Routine general medical examination at a health care facility    Reviewed health habits including diet and exercise and skin cancer prevention Reviewed appropriate screening tests for age  Also reviewed health mt list, fam hx and immunization status , as well as social and family history   See HPI Las reviewed  Colonoscopy is up to date  Mammogram due in October  Tetanus postponed for  insurance covid immunized Allergic to pneumonvax  Adv directive is up to date No cognitive concerns -very sharp Reviewed hearing screen-pt has tube in L ear baseline Nl vision screen Care team updated  Mental health is stable       Estrogen deficiency   Relevant Orders   DG Bone Density   Encounter for screening mammogram for breast cancer    Mammogram ordered  Due in oct Nl exam  Enc self breast exam         Relevant Orders   MM 3D SCREEN BREAST BILATERAL

## 2021-04-21 NOTE — Patient Instructions (Addendum)
Schedule your dexa and mammogram for oct   For diabetes/prevention  Try to get most of your carbohydrates from produce (with the exception of white potatoes)  Eat less bread/pasta/rice/snack foods/cereals/sweets and other items from the middle of the grocery store (processed carbs)  Follow up in 3 months for blood sugar (we will check a1c that day)

## 2021-04-23 NOTE — Assessment & Plan Note (Signed)
Lab Results  Component Value Date   HGBA1C 6.5 04/19/2021   This is up from 6.3 Cut off for DM-pt aware  Eating too many carbs Discussed low glycemic diet  Plan better eating and f/u in 3 mo

## 2021-04-23 NOTE — Assessment & Plan Note (Signed)
Symptoms are controlled with 20 mg daily

## 2021-04-23 NOTE — Assessment & Plan Note (Signed)
Reviewed health habits including diet and exercise and skin cancer prevention Reviewed appropriate screening tests for age  Also reviewed health mt list, fam hx and immunization status , as well as social and family history   See HPI Las reviewed  Colonoscopy is up to date  Mammogram due in October  Tetanus postponed for insurance covid immunized Allergic to pneumonvax  Adv directive is up to date No cognitive concerns -very sharp Reviewed hearing screen-pt has tube in L ear baseline Nl vision screen Care team updated  Mental health is stable

## 2021-04-23 NOTE — Assessment & Plan Note (Signed)
Disc goals for lipids and reasons to control them Rev last labs with pt Rev low sat fat diet in detail Does not tolerate statins or zetia at all  Has been to the lipid clinic Still cannot afford pcyk9 inhibitor  Very good diet  Declines another lipid referral at this time  Worrisome LDL of 258 (this is down from 286 however so commended)

## 2021-04-23 NOTE — Assessment & Plan Note (Signed)
Mammogram ordered  Due in oct Nl exam  Enc self breast exam

## 2021-04-23 NOTE — Assessment & Plan Note (Signed)
bp in fair control at this time  BP Readings from Last 1 Encounters:  04/21/21 118/76   No changes needed Most recent labs reviewed  Disc lifstyle change with low sodium diet and exercise  Plan to continue  altace 10 mg daily  triam hct 37.5-25 mg 1/2 daily  Amlodipine 5 mg daily

## 2021-04-23 NOTE — Assessment & Plan Note (Signed)
GAD and mild depression are stable Reviewed stressors/ coping techniques/symptoms/ support sources/ tx options and side effects in detail today No longer sees psychiatry  We oversee zoloft 100 mg daily and xanax 0.25 mg qhs prn

## 2021-04-23 NOTE — Assessment & Plan Note (Signed)
dexa 10/19 -ordered dexa /pt will schedule  No falls or fractures Taking ca and D  Enc more exercise when feeling up to it   Disc need for calcium/ vitamin D/ wt bearing exercise and bone density test every 2 y to monitor Disc safety/ fracture risk in detail

## 2021-04-23 NOTE — Assessment & Plan Note (Signed)
Hypothyroidism  Pt has no clinical changes No change in energy level/ hair or skin/ edema and no tremor Lab Results  Component Value Date   TSH 4.11 04/19/2021     Plan to continue levothyroxine 50 mcg daily

## 2021-04-26 ENCOUNTER — Other Ambulatory Visit: Payer: Self-pay | Admitting: Gastroenterology

## 2021-04-26 DIAGNOSIS — R1032 Left lower quadrant pain: Secondary | ICD-10-CM

## 2021-05-01 ENCOUNTER — Other Ambulatory Visit: Payer: Self-pay | Admitting: Family Medicine

## 2021-05-15 ENCOUNTER — Ambulatory Visit
Admission: RE | Admit: 2021-05-15 | Discharge: 2021-05-15 | Disposition: A | Discharge status: Home / Self Care | Payer: Medicare PPO | Source: Ambulatory Visit | Attending: Gastroenterology | Admitting: Gastroenterology

## 2021-05-15 ENCOUNTER — Other Ambulatory Visit: Payer: Self-pay

## 2021-05-15 DIAGNOSIS — K828 Other specified diseases of gallbladder: Secondary | ICD-10-CM | POA: Diagnosis not present

## 2021-05-15 DIAGNOSIS — R1032 Left lower quadrant pain: Secondary | ICD-10-CM | POA: Diagnosis not present

## 2021-05-15 DIAGNOSIS — N281 Cyst of kidney, acquired: Secondary | ICD-10-CM | POA: Diagnosis not present

## 2021-05-15 DIAGNOSIS — K5790 Diverticulosis of intestine, part unspecified, without perforation or abscess without bleeding: Secondary | ICD-10-CM | POA: Diagnosis not present

## 2021-05-15 DIAGNOSIS — R194 Change in bowel habit: Secondary | ICD-10-CM | POA: Diagnosis not present

## 2021-05-15 MED ORDER — IOHEXOL 350 MG/ML SOLN
100.0000 mL | Freq: Once | INTRAVENOUS | Status: AC | PRN
  Administered 2021-05-15: 100 mL via INTRAVENOUS

## 2021-06-23 DIAGNOSIS — J301 Allergic rhinitis due to pollen: Secondary | ICD-10-CM | POA: Diagnosis not present

## 2021-06-26 ENCOUNTER — Encounter: Payer: Self-pay | Admitting: Family Medicine

## 2021-06-26 ENCOUNTER — Other Ambulatory Visit: Payer: Self-pay | Admitting: Family Medicine

## 2021-06-27 NOTE — Telephone Encounter (Signed)
Per PCP please r/s appt when we are back at [facility]

## 2021-06-28 DIAGNOSIS — J301 Allergic rhinitis due to pollen: Secondary | ICD-10-CM | POA: Diagnosis not present

## 2021-06-29 NOTE — Telephone Encounter (Signed)
LMTCB to reschedule 11/8 appt

## 2021-06-29 NOTE — Telephone Encounter (Signed)
Will resend to support pool please f/u with pt and r/s appt per her request

## 2021-07-03 ENCOUNTER — Other Ambulatory Visit: Payer: Self-pay | Admitting: Family Medicine

## 2021-07-03 NOTE — Telephone Encounter (Signed)
Name of Medication: Xanax 0.25 mg Name of Pharmacy: [facility] or Written Date and Quantity:04/01/21 #90 with 0 refills Last Office Visit and Type: 04/21/21 CPE Next Office Visit and Type: 09/26/21 f/u Last Controlled Substance Agreement Date: none Last [id number]

## 2021-07-10 ENCOUNTER — Other Ambulatory Visit: Payer: Self-pay

## 2021-07-10 ENCOUNTER — Ambulatory Visit
Admission: RE | Admit: 2021-07-10 | Discharge: 2021-07-10 | Disposition: A | Discharge status: Home / Self Care | Payer: Medicare PPO | Source: Ambulatory Visit | Attending: Family Medicine | Admitting: Family Medicine

## 2021-07-10 DIAGNOSIS — Z1231 Encounter for screening mammogram for malignant neoplasm of breast: Secondary | ICD-10-CM

## 2021-07-10 DIAGNOSIS — E2839 Other primary ovarian failure: Secondary | ICD-10-CM | POA: Insufficient documentation

## 2021-07-10 DIAGNOSIS — Z780 Asymptomatic menopausal state: Secondary | ICD-10-CM | POA: Diagnosis not present

## 2021-07-10 DIAGNOSIS — M8589 Other specified disorders of bone density and structure, multiple sites: Secondary | ICD-10-CM | POA: Diagnosis not present

## 2021-07-11 DIAGNOSIS — E119 Type 2 diabetes mellitus without complications: Secondary | ICD-10-CM | POA: Diagnosis not present

## 2021-07-25 ENCOUNTER — Ambulatory Visit: Payer: Medicare PPO | Admitting: Family Medicine

## 2021-07-28 ENCOUNTER — Other Ambulatory Visit: Payer: Self-pay | Admitting: Family Medicine

## 2021-08-07 ENCOUNTER — Other Ambulatory Visit: Payer: Self-pay | Admitting: Family Medicine

## 2021-08-28 IMAGING — MG DIGITAL SCREENING BILAT W/ TOMO W/ CAD
8 series · 8 of 24 positions shown · non-contrast
Comparison: Previous exam(s).

CLINICAL DATA: Screening.

EXAM:
DIGITAL SCREENING BILATERAL MAMMOGRAM WITH TOMO AND CAD

[R CC synth-2D]
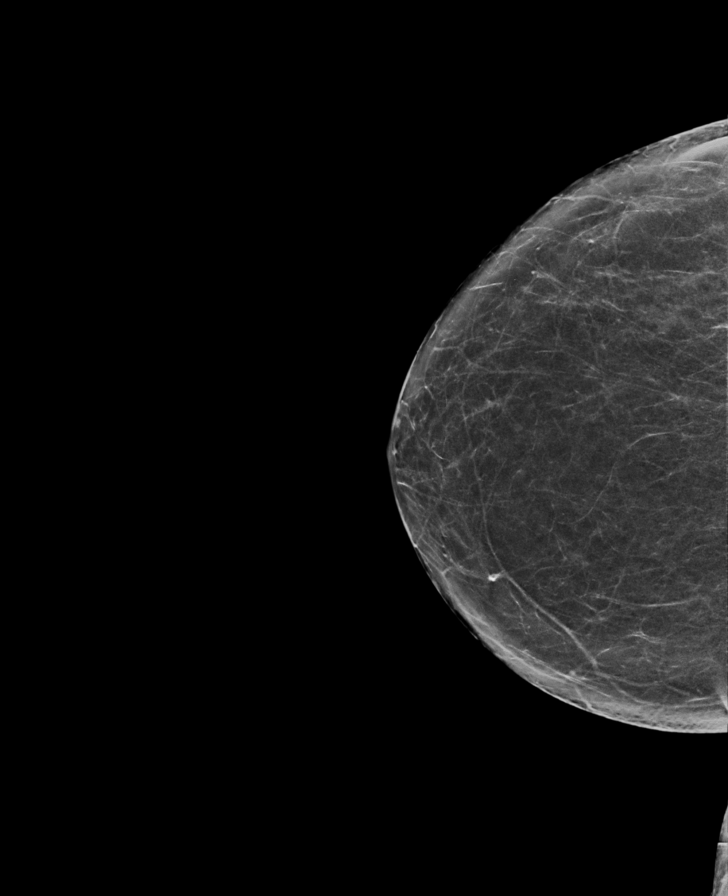

[L CC synth-2D]
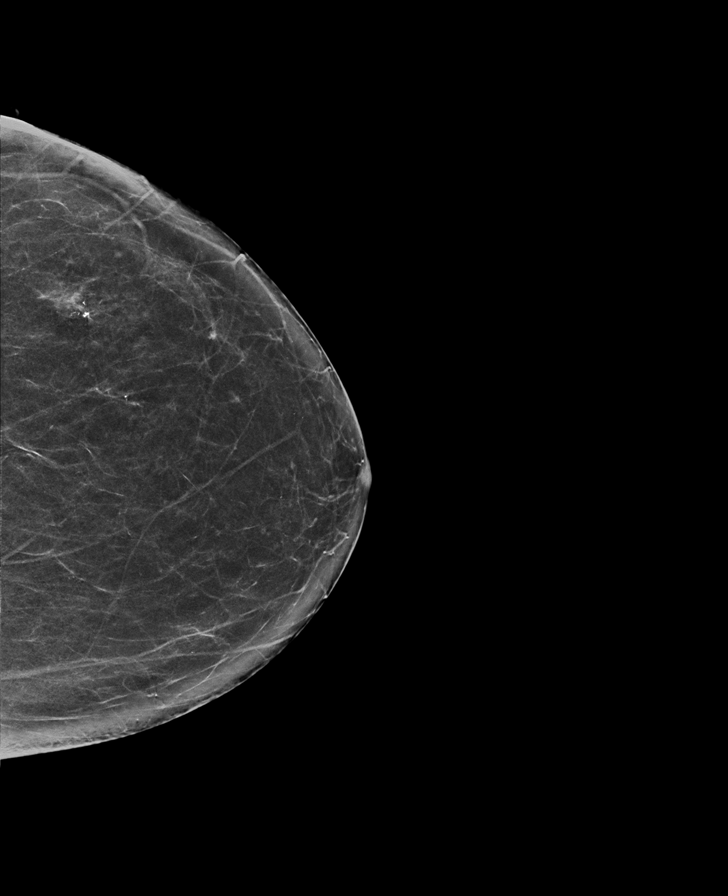

[L MLO synth-2D]
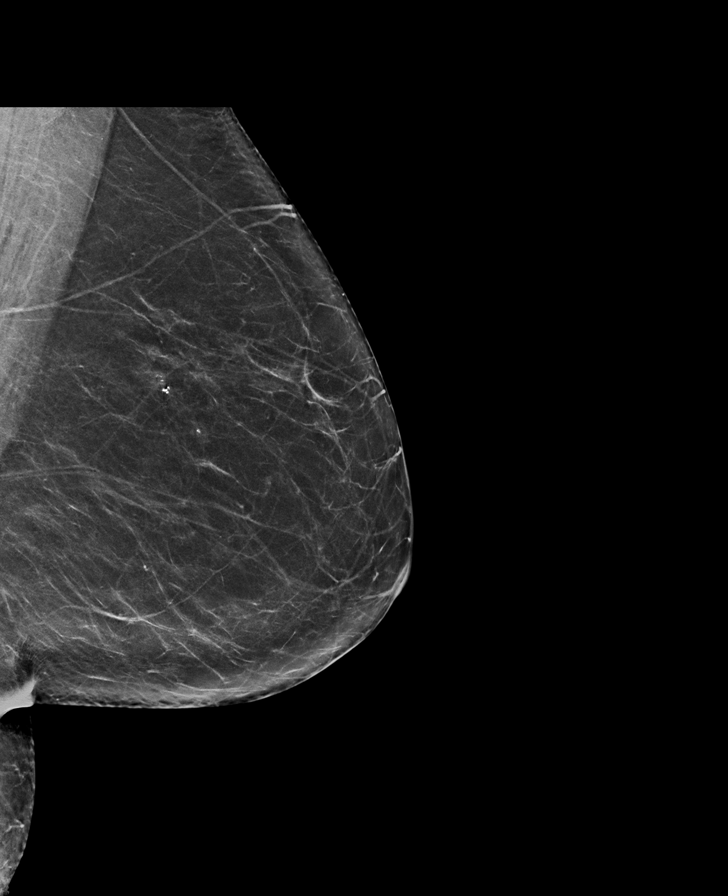

[R MLO synth-2D]
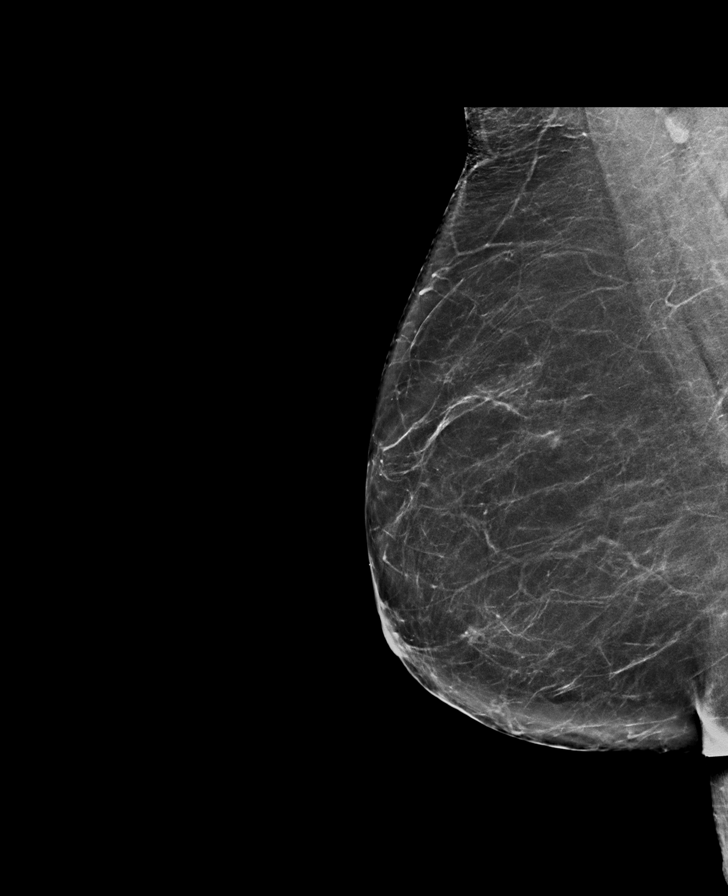

[R CC tomo · tomo slice 32/63.0]
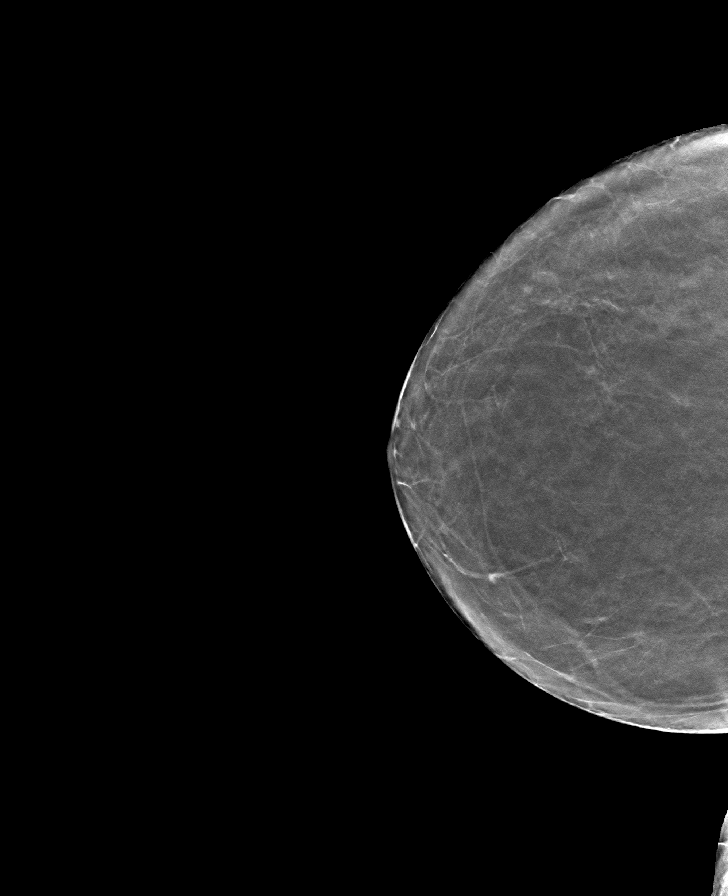

[R MLO tomo · tomo slice 38/75.0]
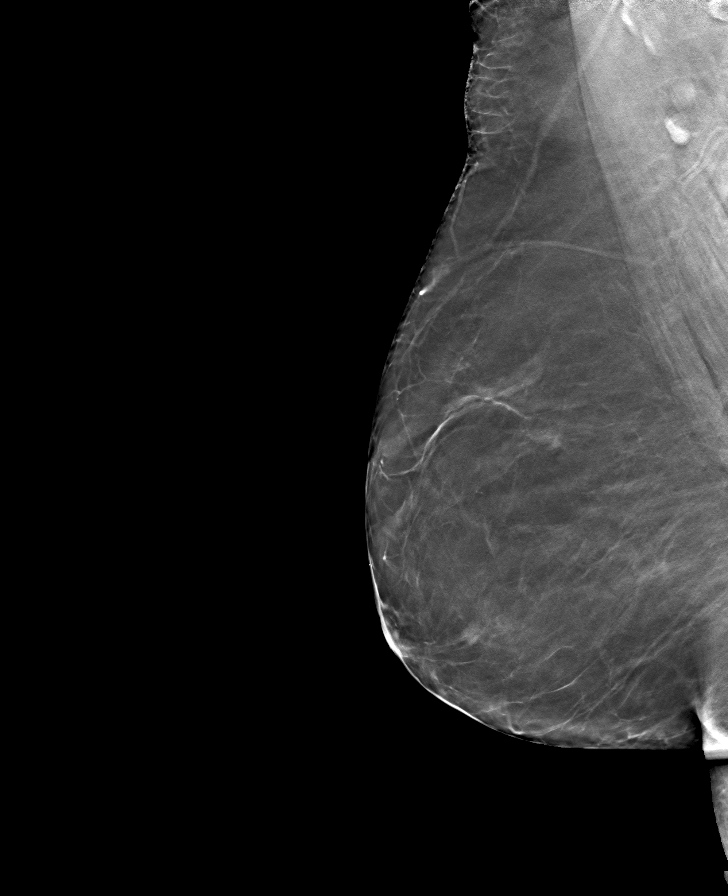

[L CC tomo · tomo slice 34/67.0]
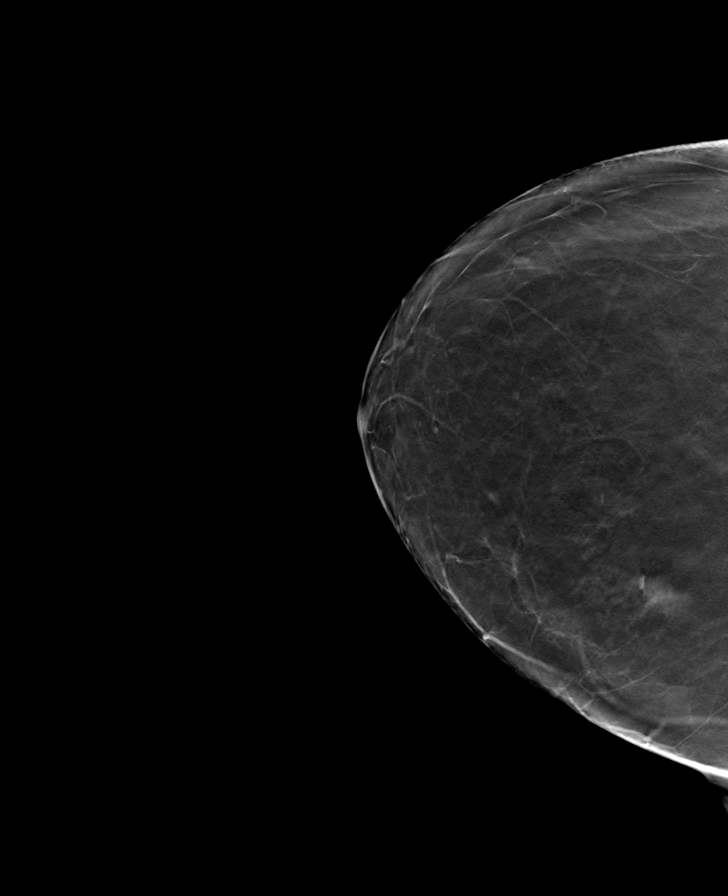

[L MLO tomo · tomo slice 35/70.0]
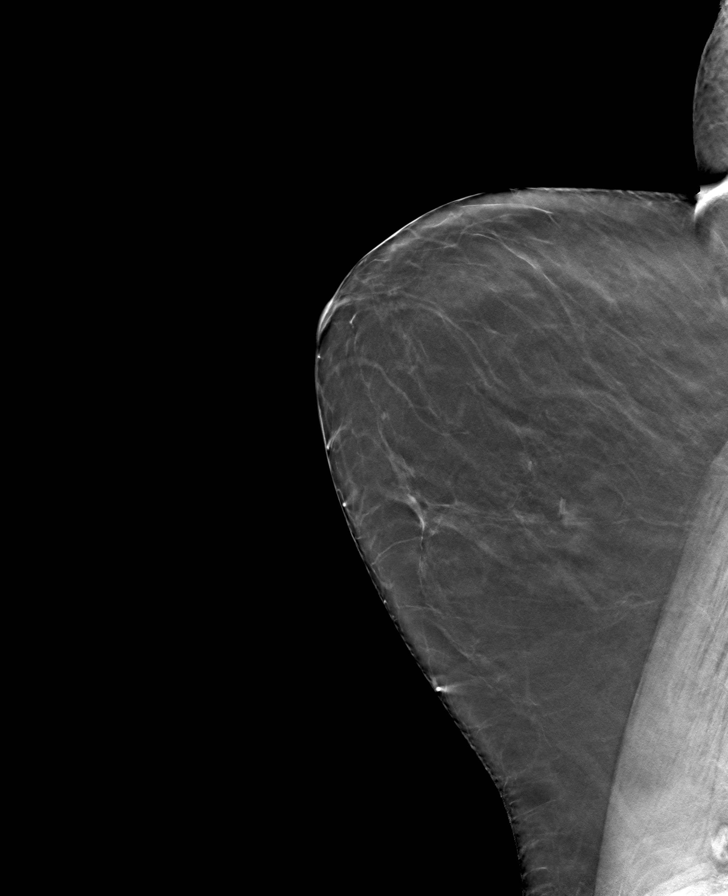

[8 of 24 positions shown; findings below may reference images not displayed]

ACR Breast Density Category b: There are scattered areas of
fibroglandular density.
FINDINGS: There are no findings suspicious for malignancy. Images were
processed with CAD.
IMPRESSION: No mammographic evidence of malignancy. A result letter of this
screening mammogram will be mailed directly to the patient.

RECOMMENDATION:
Screening mammogram in one year. (Code:[id number])

BI-RADS CATEGORY  1: Negative.

## 2021-09-14 ENCOUNTER — Other Ambulatory Visit: Payer: Self-pay | Admitting: Family Medicine

## 2021-09-26 ENCOUNTER — Ambulatory Visit: Payer: Medicare PPO | Admitting: Family Medicine

## 2021-10-02 ENCOUNTER — Other Ambulatory Visit: Payer: Self-pay | Admitting: Family Medicine

## 2021-10-04 DIAGNOSIS — J301 Allergic rhinitis due to pollen: Secondary | ICD-10-CM | POA: Diagnosis not present

## 2021-10-09 DIAGNOSIS — J301 Allergic rhinitis due to pollen: Secondary | ICD-10-CM | POA: Diagnosis not present

## 2021-11-07 ENCOUNTER — Other Ambulatory Visit: Payer: Self-pay

## 2021-11-07 ENCOUNTER — Ambulatory Visit: Payer: Medicare PPO | Admitting: Family Medicine

## 2021-11-07 ENCOUNTER — Encounter: Payer: Self-pay | Admitting: Family Medicine

## 2021-11-07 VITALS — BP 124/78 | HR 78 | Temp 97.7°F | Ht 63.5 in | Wt 173.5 lb

## 2021-11-07 DIAGNOSIS — M5136 Other intervertebral disc degeneration, lumbar region: Secondary | ICD-10-CM | POA: Diagnosis not present

## 2021-11-07 DIAGNOSIS — R7303 Prediabetes: Secondary | ICD-10-CM | POA: Diagnosis not present

## 2021-11-07 DIAGNOSIS — M85859 Other specified disorders of bone density and structure, unspecified thigh: Secondary | ICD-10-CM

## 2021-11-07 DIAGNOSIS — I10 Essential (primary) hypertension: Secondary | ICD-10-CM

## 2021-11-07 DIAGNOSIS — L989 Disorder of the skin and subcutaneous tissue, unspecified: Secondary | ICD-10-CM | POA: Insufficient documentation

## 2021-11-07 LAB — POCT GLYCOSYLATED HEMOGLOBIN (HGB A1C): Hemoglobin A1C: 6.1 % — AB (ref 4.0–5.6)

## 2021-11-07 NOTE — Progress Notes (Signed)
Subjective:    Patient ID: [name], female    DOB: 1946-06-02, 76 y.o.   MRN: [id number]  This visit occurred during the SARS-CoV-2 public health emergency.  Safety protocols were in place, including screening questions prior to the visit, additional usage of staff PPE, and extensive cleaning of exam room while observing appropriate contact time as indicated for disinfecting solutions.   HPI Pt presents for f/u of prediabetes and HTN   Wt Readings from Last 3 Encounters:  11/07/21 173 lb 8 oz (78.7 kg)  04/21/21 179 lb 8 oz (81.4 kg)  04/17/21 177 lb (80.3 kg)   30.25 kg/m  HTN bp is stable today  No cp or palpitations or headaches or edema  No side effects to medicines  BP Readings from Last 3 Encounters:  11/07/21 124/78  04/21/21 118/76  04/17/21 127/68     Altace 10mg  daily  Triam hct 37.5-25 mg 1/2 daily  Amlodipine 5 mg daily   Prediabetes Lab Results  Component Value Date   HGBA1C 6.5 04/19/2021   Due for a1c today   Down to 6.1 today ! Lab Results  Component Value Date   HGBA1C 6.1 (A) 11/07/2021      Last visit she ate too many carbs  Low glycemic diet was discussed   Working on her diet  Eating less in general  Wt is down 6 lb More veggies   Cut way back on sweets   (hard because her husb bakes)  Cut way back on bread  Pasta is whole wheat  Occ potato Avoid fries   Exercise : Not a lot due to pain in her back  5 min on bike- bad pain  15 min walk-bad pain    Chronic back trouble  Everything bothers her  Has not done PT in a while   Deg disc dz  Has gone to orthopedics in the past  No surgery  Gets steroid pack as needed   Takes aleve Uses heat     Osteopenia last dexa  Open to treatment  Down in the forearm   Blister at base of L thumb  Getting bigger   Patient Active Problem List   Diagnosis Date Noted   Lumbar degenerative disc disease 11/07/2021   Skin lesion of hand 11/07/2021   Encounter for screening  mammogram for breast cancer 04/21/2021   LLQ abdominal pain 02/28/2021   Chest wall pain 04/23/2019   Osteopenia 02/13/2016   Routine general medical examination at a health care facility 01/23/2016   Estrogen deficiency 01/23/2016   Hypothyroid 01/23/2016   Mild depression 02/21/2015   H/O diabetes mellitus 01/04/2015   H/O arthritis 01/04/2015   History of hay fever 01/04/2015   Encounter for Medicare annual wellness exam 01/14/2013   Obesity (BMI 30-39.9) 01/15/2012   MENOPAUSAL SYNDROME 10/04/2008   Prediabetes 12/02/2007   TREMOR 12/02/2007   Hyperlipidemia 01/28/2007   Generalized anxiety disorder 01/28/2007   Essential hypertension 01/28/2007   GERD 01/28/2007   URINARY INCONTINENCE 01/28/2007   COLONIC POLYPS, HX OF 01/28/2007   HERPES ZOSTER 01/20/2007   DYSPHONIA 01/20/2007   COUGH, CHRONIC 01/20/2007   ALLERGY 01/20/2007   Past Medical History:  Diagnosis Date   Allergic rhinitis    gets allergy shot from Dr. Carlis Abbott   Anxiety    Arthritis    Diabetes mellitus without complication ([place])    diet controlled   GERD (gastroesophageal reflux disease)    History of shingles  at age 40- mild   Hx of colonic polyp    Hyperglycemia    Hyperlipemia    Very high chol intol of statins-does not want to check it   Hypertension    Panic disorder    Tremor    from anx   Urinary incontinence    mixed   Voice disorder    Past Surgical History:  Procedure Laterality Date   ABDOMINAL HYSTERECTOMY     BLADDER SUSPENSION     COLONOSCOPY WITH PROPOFOL N/A 12/24/2016   Procedure: COLONOSCOPY WITH PROPOFOL;  Surgeon: Manya Silvas, MD;  Location: [place];  Service: Endoscopy;  Laterality: N/A;   COLONOSCOPY WITH PROPOFOL N/A 04/17/2021   Procedure: COLONOSCOPY WITH PROPOFOL;  Surgeon: Lesly Rubenstein, MD;  Location: [facility] ENDOSCOPY;  Service: Endoscopy;  Laterality: N/A;   ESOPHAGOGASTRODUODENOSCOPY (EGD) WITH PROPOFOL N/A 12/24/2016   Procedure:  ESOPHAGOGASTRODUODENOSCOPY (EGD) WITH PROPOFOL;  Surgeon: Manya Silvas, MD;  Location: [facility] ENDOSCOPY;  Service: Endoscopy;  Laterality: N/A;   EYE SURGERY Bilateral 2000   eye lens replacement   HAND SURGERY Left 2016   left long trigger finger release   JOINT REPLACEMENT     knee replacement - march 15   KNEE ARTHROSCOPY Left 04/26/2016   Procedure: ARTHROSCOPY KNEE;  Surgeon: Hessie Knows, MD;  Location: [facility] ORS;  Service: Orthopedics;  Laterality: Left;   MEDIAL PARTIAL KNEE REPLACEMENT Left 2016   NASAL SINUS SURGERY     PARTIAL HYSTERECTOMY     with fibroid   RECTOCELE REPAIR     SYNOVECTOMY Left 04/26/2016   Procedure: PARTIAL SYNOVECTOMY;  Surgeon: Hessie Knows, MD;  Location: [facility] ORS;  Service: Orthopedics;  Laterality: Left;   TONSILLECTOMY     Social History   Tobacco Use   Smoking status: Never   Smokeless tobacco: Never  Vaping Use   Vaping Use: Never used  Substance Use Topics   Alcohol use: No    Alcohol/week: 0.0 standard drinks   Drug use: No   Family History  Problem Relation Age of Onset   Diabetes Mother    Hyperlipidemia Mother    Hypertension Mother    Osteoarthritis Mother    Cancer Father        Colon   Leukemia Sister    Syncope episode Daughter    Breast cancer Neg Hx    Allergies  Allergen Reactions   Atorvastatin     REACTION: shoulder pain   Ezetimibe     REACTION: ?   Lisinopril     REACTION: muscle pain   Molds & Smuts    Penicillamine    Penicillins     REACTION: reaction not known Has patient had a PCN reaction causing immediate rash, facial/tongue/throat swelling, SOB or lightheadedness with hypotension: unknown Has patient had a PCN reaction causing severe rash involving mucus membranes or skin necrosis: unknown Has patient had a PCN reaction that required hospitalization unsure Has patient had a PCN reaction occurring within the last 10 years: no If all of the above answers are "NO", then may proceed with Cephalosporin  use.   Pneumococcal Vaccine Swelling    REACTION: severe local reaction   Pneumococcal Vaccine Polyvalent     REACTION: severe local reaction   Pollen Extract    Rosuvastatin     REACTION: leg pain   Simvastatin     REACTION: muscle pain   Current Outpatient Medications on File Prior to Visit  Medication Sig Dispense Refill   ALPRAZolam (  XANAX) 0.25 MG tablet TAKE ONE TABLET AT BEDTIME AS NEEDED FORANXIETY 90 tablet 0   Azelastine HCl (ASTEPRO) 0.15 % SOLN Place 1 spray into the nose at bedtime.     Calcium Carbonate-Vitamin D (CALCIUM 600+D PO) Take 1 tablet by mouth 2 (two) times daily.      EPINEPHrine 0.3 mg/0.3 mL IJ SOAJ injection Inject 0.3 mg into the skin once. as needed.     estradiol (ESTRACE) 0.1 MG/GM vaginal cream Place 1 Applicatorful vaginally 3 (three) times a week.     levocetirizine (XYZAL) 5 MG tablet Take 1 tablet by mouth daily.     levothyroxine (SYNTHROID) 50 MCG tablet TAKE 1 TABLET EVERY DAY ON EMPTY STOMACHWITH A GLASS OF WATER AT LEAST 30-60 MINBEFORE BREAKFAST 90 tablet 2   Melatonin 10 MG TABS Take 1 tablet by mouth at bedtime.     Omega-3 Fatty Acids (FISH OIL) 1200 MG CAPS Take 1 capsule by mouth daily.      omeprazole (PRILOSEC) 20 MG capsule TAKE 2 CAPSULES BY MOUTH EVERY MORNING AND 1 CAPSULE IN THE EVENING 270 capsule 2   ramipril (ALTACE) 10 MG capsule TAKE 1 CAPSULE BY MOUTH EVERY DAY 90 capsule 2   sertraline (ZOLOFT) 100 MG tablet TAKE 1 TABLET BY MOUTH DAILY 90 tablet 1   triamterene-hydrochlorothiazide (MAXZIDE-25) 37.5-25 MG tablet TAKE 1/2 TABLET EVERYDAY 45 tablet 2   Vibegron (GEMTESA) 75 MG TABS Take 1 tablet by mouth daily.     No current facility-administered medications on file prior to visit.     Review of Systems  Constitutional:  Negative for activity change, appetite change, fatigue, fever and unexpected weight change.  HENT:  Negative for congestion, ear pain, rhinorrhea, sinus pressure and sore throat.   Eyes:  Negative for  pain, redness and visual disturbance.  Respiratory:  Negative for cough, shortness of breath and wheezing.   Cardiovascular:  Negative for chest pain and palpitations.  Gastrointestinal:  Negative for abdominal pain, blood in stool, constipation and diarrhea.  Endocrine: Negative for polydipsia and polyuria.  Genitourinary:  Negative for dysuria, frequency and urgency.  Musculoskeletal:  Positive for back pain. Negative for arthralgias and myalgias.  Skin:  Negative for pallor and rash.  Allergic/Immunologic: Negative for environmental allergies.  Neurological:  Negative for dizziness, syncope and headaches.  Hematological:  Negative for adenopathy. Does not bruise/bleed easily.  Psychiatric/Behavioral:  Negative for decreased concentration and dysphoric mood. The patient is not nervous/anxious.       Objective:   Physical Exam Constitutional:      General: She is not in acute distress.    Appearance: Normal appearance. She is well-developed. She is obese. She is not ill-appearing or diaphoretic.  HENT:     Head: Normocephalic and atraumatic.  Eyes:     Conjunctiva/sclera: Conjunctivae normal.     Pupils: Pupils are equal, round, and reactive to light.  Neck:     Thyroid: No thyromegaly.     Vascular: No carotid bruit or JVD.  Cardiovascular:     Rate and Rhythm: Normal rate and regular rhythm.     Heart sounds: Normal heart sounds.    No gallop.  Pulmonary:     Effort: Pulmonary effort is normal. No respiratory distress.     Breath sounds: Normal breath sounds. No wheezing or rales.  Abdominal:     General: Bowel sounds are normal. There is no abdominal bruit.     Palpations: Abdomen is soft.  Musculoskeletal:  Cervical back: Normal range of motion and neck supple.     Right lower leg: No edema.     Left lower leg: No edema.     Comments: Loss of lordosis noted No spinous tenderness Limited flexion and ext of LS  No neuro changes   Lymphadenopathy:     Cervical: No  cervical adenopathy.  Skin:    General: Skin is warm and dry.     Coloration: Skin is not pale.     Findings: No rash.     Comments: 4 mm irregular shallow denuded area on L thenar area of hand  Pink in color  No tenderness  Neurological:     Mental Status: She is alert.     Sensory: No sensory deficit.     Coordination: Coordination normal.     Deep Tendon Reflexes: Reflexes are normal and symmetric. Reflexes normal.  Psychiatric:        Mood and Affect: Mood normal.          Assessment & Plan:   Problem List Items Addressed This Visit       Cardiovascular and Mediastinum   Essential hypertension    bp in fair control at this time  BP Readings from Last 1 Encounters:  11/07/21 124/78  No changes needed Most recent labs reviewed  Disc lifstyle change with low sodium diet and exercise   altace 10 mg daily  triam hct 37.5-25 mg half pill daily  Amlodipine 5 mg daily         Musculoskeletal and Integument   Lumbar degenerative disc disease    Pt struggles to find exercise (for general fitness) that does not cause severe back pain  Ref to PT for help with pain and also to address fitness goals if possible  Would consider steroid pack if not improved (only if abs nec because of blood sugar issues and OP)      Relevant Orders   Ambulatory referral to Physical Therapy   Osteopenia    Reviewed dexa Down in forearm Given info on alendronate to look at and consider  No active dental problems No falls or fx Discussed safety      Skin lesion of hand    Shallow denuded area -resembles blister at base of R thumb/thenar area  No pain  Won't heal There for months per pt  Ref to derm done       Relevant Orders   Ambulatory referral to Dermatology     Other   Prediabetes - Primary    Lab Results  Component Value Date   HGBA1C 6.1 (A) 11/07/2021  Improved with better diet  disc imp of low glycemic diet and wt loss to prevent DM2       Relevant Orders    POCT glycosylated hemoglobin (Hb A1C) (Completed)

## 2021-11-07 NOTE — Assessment & Plan Note (Signed)
Pt struggles to find exercise (for general fitness) that does not cause severe back pain  Ref to PT for help with pain and also to address fitness goals if possible  Would consider steroid pack if not improved (only if abs nec because of blood sugar issues and OP)

## 2021-11-07 NOTE — Assessment & Plan Note (Signed)
bp in fair control at this time  BP Readings from Last 1 Encounters:  11/07/21 124/78   No changes needed Most recent labs reviewed  Disc lifstyle change with low sodium diet and exercise   altace 10 mg daily  triam hct 37.5-25 mg half pill daily  Amlodipine 5 mg daily

## 2021-11-07 NOTE — Assessment & Plan Note (Signed)
Lab Results  Component Value Date   HGBA1C 6.1 (A) 11/07/2021   Improved with better diet  disc imp of low glycemic diet and wt loss to prevent DM2

## 2021-11-07 NOTE — Assessment & Plan Note (Signed)
Reviewed dexa Down in forearm Given info on alendronate to look at and consider  No active dental problems No falls or fx Discussed safety

## 2021-11-07 NOTE — Assessment & Plan Note (Signed)
Shallow denuded area -resembles blister at base of R thumb/thenar area  No pain  Won't heal There for months per pt  Ref to derm done

## 2021-11-07 NOTE — Patient Instructions (Addendum)
Look at the info about alendronate for your bone health  We would treat for about 5 years if you tolerate it well  Once your back if doing better let me know if you are interested   Let's do PT - to help symptoms now and also to find (for the future) what exercise you can do for general health that will not hurt your back   If you don't hear from [place] in 1-2 weeks please call the office   If no improvement in back pain after PT -then let us know and we will do a steroid pack    A1c is better! Keep up the good work

## 2021-11-10 ENCOUNTER — Encounter: Payer: Self-pay | Admitting: *Deleted

## 2021-11-13 ENCOUNTER — Encounter: Payer: Self-pay | Admitting: Family Medicine

## 2021-11-15 DIAGNOSIS — L570 Actinic keratosis: Secondary | ICD-10-CM | POA: Diagnosis not present

## 2021-11-15 DIAGNOSIS — D2272 Melanocytic nevi of left lower limb, including hip: Secondary | ICD-10-CM | POA: Diagnosis not present

## 2021-11-15 DIAGNOSIS — L820 Inflamed seborrheic keratosis: Secondary | ICD-10-CM | POA: Diagnosis not present

## 2021-11-15 DIAGNOSIS — D2271 Melanocytic nevi of right lower limb, including hip: Secondary | ICD-10-CM | POA: Diagnosis not present

## 2021-11-15 DIAGNOSIS — D485 Neoplasm of uncertain behavior of skin: Secondary | ICD-10-CM | POA: Diagnosis not present

## 2021-11-15 DIAGNOSIS — D225 Melanocytic nevi of trunk: Secondary | ICD-10-CM | POA: Diagnosis not present

## 2021-11-15 DIAGNOSIS — X32XXXA Exposure to sunlight, initial encounter: Secondary | ICD-10-CM | POA: Diagnosis not present

## 2021-11-15 DIAGNOSIS — L821 Other seborrheic keratosis: Secondary | ICD-10-CM | POA: Diagnosis not present

## 2021-11-15 DIAGNOSIS — D2262 Melanocytic nevi of left upper limb, including shoulder: Secondary | ICD-10-CM | POA: Diagnosis not present

## 2021-11-15 DIAGNOSIS — D2261 Melanocytic nevi of right upper limb, including shoulder: Secondary | ICD-10-CM | POA: Diagnosis not present

## 2021-11-23 DIAGNOSIS — M6281 Muscle weakness (generalized): Secondary | ICD-10-CM | POA: Diagnosis not present

## 2021-11-23 DIAGNOSIS — M5451 Vertebrogenic low back pain: Secondary | ICD-10-CM | POA: Diagnosis not present

## 2021-11-25 ENCOUNTER — Other Ambulatory Visit: Payer: Self-pay | Admitting: Family Medicine

## 2021-11-27 ENCOUNTER — Other Ambulatory Visit: Payer: Self-pay | Admitting: Family Medicine

## 2021-11-27 DIAGNOSIS — L570 Actinic keratosis: Secondary | ICD-10-CM | POA: Diagnosis not present

## 2021-11-28 DIAGNOSIS — M6281 Muscle weakness (generalized): Secondary | ICD-10-CM | POA: Diagnosis not present

## 2021-11-28 DIAGNOSIS — M5451 Vertebrogenic low back pain: Secondary | ICD-10-CM | POA: Diagnosis not present

## 2021-11-30 DIAGNOSIS — M5451 Vertebrogenic low back pain: Secondary | ICD-10-CM | POA: Diagnosis not present

## 2021-11-30 DIAGNOSIS — M6281 Muscle weakness (generalized): Secondary | ICD-10-CM | POA: Diagnosis not present

## 2021-12-04 DIAGNOSIS — M6281 Muscle weakness (generalized): Secondary | ICD-10-CM | POA: Diagnosis not present

## 2021-12-04 DIAGNOSIS — M5451 Vertebrogenic low back pain: Secondary | ICD-10-CM | POA: Diagnosis not present

## 2021-12-07 DIAGNOSIS — M5451 Vertebrogenic low back pain: Secondary | ICD-10-CM | POA: Diagnosis not present

## 2021-12-07 DIAGNOSIS — M6281 Muscle weakness (generalized): Secondary | ICD-10-CM | POA: Diagnosis not present

## 2021-12-13 DIAGNOSIS — M5451 Vertebrogenic low back pain: Secondary | ICD-10-CM | POA: Diagnosis not present

## 2021-12-13 DIAGNOSIS — M6281 Muscle weakness (generalized): Secondary | ICD-10-CM | POA: Diagnosis not present

## 2021-12-15 DIAGNOSIS — M6281 Muscle weakness (generalized): Secondary | ICD-10-CM | POA: Diagnosis not present

## 2021-12-15 DIAGNOSIS — M5451 Vertebrogenic low back pain: Secondary | ICD-10-CM | POA: Diagnosis not present

## 2021-12-19 DIAGNOSIS — M6281 Muscle weakness (generalized): Secondary | ICD-10-CM | POA: Diagnosis not present

## 2021-12-19 DIAGNOSIS — M5451 Vertebrogenic low back pain: Secondary | ICD-10-CM | POA: Diagnosis not present

## 2021-12-21 DIAGNOSIS — M5451 Vertebrogenic low back pain: Secondary | ICD-10-CM | POA: Diagnosis not present

## 2021-12-21 DIAGNOSIS — M6281 Muscle weakness (generalized): Secondary | ICD-10-CM | POA: Diagnosis not present

## 2021-12-27 DIAGNOSIS — M6281 Muscle weakness (generalized): Secondary | ICD-10-CM | POA: Diagnosis not present

## 2021-12-27 DIAGNOSIS — J301 Allergic rhinitis due to pollen: Secondary | ICD-10-CM | POA: Diagnosis not present

## 2021-12-27 DIAGNOSIS — M5451 Vertebrogenic low back pain: Secondary | ICD-10-CM | POA: Diagnosis not present

## 2021-12-29 ENCOUNTER — Other Ambulatory Visit: Payer: Self-pay | Admitting: Family Medicine

## 2021-12-29 NOTE — Telephone Encounter (Signed)
Name of Medication: Xanax ?Name of Pharmacy: [facility] ?Last Fill or Written Date and Quantity: 10/02/21 #90 tab/ 0 refill ?Last Office Visit and Type: f/u on 11/07/21 ?Next Office Visit and Type: CPE on 04/23/22 ?: ?  ?

## 2022-01-02 DIAGNOSIS — M5451 Vertebrogenic low back pain: Secondary | ICD-10-CM | POA: Diagnosis not present

## 2022-01-02 DIAGNOSIS — M6281 Muscle weakness (generalized): Secondary | ICD-10-CM | POA: Diagnosis not present

## 2022-01-03 DIAGNOSIS — J301 Allergic rhinitis due to pollen: Secondary | ICD-10-CM | POA: Diagnosis not present

## 2022-01-04 DIAGNOSIS — M5451 Vertebrogenic low back pain: Secondary | ICD-10-CM | POA: Diagnosis not present

## 2022-01-04 DIAGNOSIS — M6281 Muscle weakness (generalized): Secondary | ICD-10-CM | POA: Diagnosis not present

## 2022-01-15 DIAGNOSIS — M6281 Muscle weakness (generalized): Secondary | ICD-10-CM | POA: Diagnosis not present

## 2022-01-15 DIAGNOSIS — M5451 Vertebrogenic low back pain: Secondary | ICD-10-CM | POA: Diagnosis not present

## 2022-01-22 ENCOUNTER — Other Ambulatory Visit: Payer: Self-pay | Admitting: Family Medicine

## 2022-01-22 DIAGNOSIS — M6281 Muscle weakness (generalized): Secondary | ICD-10-CM | POA: Diagnosis not present

## 2022-01-22 DIAGNOSIS — M5451 Vertebrogenic low back pain: Secondary | ICD-10-CM | POA: Diagnosis not present

## 2022-01-22 NOTE — Telephone Encounter (Signed)
Last filled on 10/23/21  is this okay to fill ?

## 2022-02-05 DIAGNOSIS — M5451 Vertebrogenic low back pain: Secondary | ICD-10-CM | POA: Diagnosis not present

## 2022-02-05 DIAGNOSIS — M6281 Muscle weakness (generalized): Secondary | ICD-10-CM | POA: Diagnosis not present

## 2022-02-13 DIAGNOSIS — M5451 Vertebrogenic low back pain: Secondary | ICD-10-CM | POA: Diagnosis not present

## 2022-02-13 DIAGNOSIS — M6281 Muscle weakness (generalized): Secondary | ICD-10-CM | POA: Diagnosis not present

## 2022-03-08 ENCOUNTER — Emergency Department: Payer: Medicare PPO

## 2022-03-08 ENCOUNTER — Other Ambulatory Visit: Payer: Self-pay

## 2022-03-08 ENCOUNTER — Encounter: Payer: Self-pay | Admitting: Intensive Care

## 2022-03-08 ENCOUNTER — Emergency Department
Admission: EM | Admit: 2022-03-08 | Discharge: 2022-03-08 | Disposition: A | Discharge status: Home / Self Care | Payer: Medicare PPO | Attending: Emergency Medicine | Admitting: Emergency Medicine

## 2022-03-08 DIAGNOSIS — R55 Syncope and collapse: Secondary | ICD-10-CM | POA: Diagnosis not present

## 2022-03-08 DIAGNOSIS — M47812 Spondylosis without myelopathy or radiculopathy, cervical region: Secondary | ICD-10-CM | POA: Diagnosis not present

## 2022-03-08 DIAGNOSIS — W1811XA Fall from or off toilet without subsequent striking against object, initial encounter: Secondary | ICD-10-CM | POA: Diagnosis not present

## 2022-03-08 DIAGNOSIS — S0003XA Contusion of scalp, initial encounter: Secondary | ICD-10-CM | POA: Diagnosis not present

## 2022-03-08 DIAGNOSIS — S0990XA Unspecified injury of head, initial encounter: Secondary | ICD-10-CM | POA: Diagnosis present

## 2022-03-08 DIAGNOSIS — I10 Essential (primary) hypertension: Secondary | ICD-10-CM | POA: Diagnosis not present

## 2022-03-08 DIAGNOSIS — E119 Type 2 diabetes mellitus without complications: Secondary | ICD-10-CM | POA: Insufficient documentation

## 2022-03-08 DIAGNOSIS — S0993XA Unspecified injury of face, initial encounter: Secondary | ICD-10-CM | POA: Diagnosis not present

## 2022-03-08 LAB — URINALYSIS, ROUTINE W REFLEX MICROSCOPIC
Bilirubin Urine: NEGATIVE
Glucose, UA: NEGATIVE mg/dL
Hgb urine dipstick: NEGATIVE
Ketones, ur: NEGATIVE mg/dL
Leukocytes,Ua: NEGATIVE
Nitrite: NEGATIVE
Protein, ur: NEGATIVE mg/dL
Specific Gravity, Urine: 1.019 (ref 1.005–1.030)
pH: 5 (ref 5.0–8.0)

## 2022-03-08 LAB — BASIC METABOLIC PANEL
Anion gap: 8 (ref 5–15)
BUN: 26 mg/dL — ABNORMAL HIGH (ref 8–23)
CO2: 25 mmol/L (ref 22–32)
Calcium: 9.4 mg/dL (ref 8.9–10.3)
Chloride: 103 mmol/L (ref 98–111)
Creatinine, Ser: 0.97 mg/dL (ref 0.44–1.00)
GFR, Estimated: >60 mL/min (ref >60)
Glucose, Bld: 111 mg/dL — ABNORMAL HIGH (ref 70–99)
Potassium: 4.2 mmol/L (ref 3.5–5.1)
Sodium: 136 mmol/L (ref 135–145)

## 2022-03-08 LAB — CBC
HCT: 45.2 % (ref 36.0–46.0)
Hemoglobin: 14.6 g/dL (ref 12.0–15.0)
MCH: 27 pg (ref 26.0–34.0)
MCHC: 32.3 g/dL (ref 30.0–36.0)
MCV: 83.7 fL (ref 80.0–100.0)
Platelets: 272 10*3/uL (ref 150–400)
RBC: 5.4 MIL/uL — ABNORMAL HIGH (ref 3.87–5.11)
RDW: 13.2 % (ref 11.5–15.5)
WBC: 8.7 10*3/uL (ref 4.0–10.5)
nRBC: 0 % (ref 0.0–0.2)

## 2022-03-08 NOTE — ED Provider Triage Note (Signed)
Emergency Medicine Provider Triage Evaluation Note  [name] , a 76 y.o. female  was evaluated in triage.  Pt complains of contusion to the right side of her nose. She had a syncopal episode 4 days ago while having a bowel movement. She woke up on the floor with a hematoma and a "dent" in her forehead. That went away, but is still tender. Today, she noticed that there was new bruising to the side of her nose near her right eye. No vision changes.  Physical Exam  BP (!) 149/81 (BP Location: Right Arm)   Pulse 79   Temp 97.8 F (36.6 C) (Oral)   Resp 18   Ht '5\' 4"'$  (1.626 m)   Wt 78 kg   LMP 09/18/1987   SpO2 93%   BMI 29.52 kg/m  Gen:   Awake, no distress   Resp:  Normal effort  MSK:   Moves extremities without difficulty  Other:  Ecchymosis on right side of nose near inner canthus of right eye.  Medical Decision Making  Medically screening exam initiated at 3:51 PM.  Appropriate orders placed.  [name] was informed that the remainder of the evaluation will be completed by another provider, this initial triage assessment does not replace that evaluation, and the importance of remaining in the ED until their evaluation is complete.    Victorino Dike, FNP 03/08/22 1610

## 2022-03-08 NOTE — ED Triage Notes (Signed)
Patient reports syncope episode while on toilet during a IBS episode on Sunday 6/18. Noted hematoma to right forehead and bruising around right eye. Reports some lightheadedness off and on since.

## 2022-03-15 ENCOUNTER — Encounter: Payer: Self-pay | Admitting: Internal Medicine

## 2022-03-15 ENCOUNTER — Other Ambulatory Visit: Payer: Self-pay | Admitting: Family Medicine

## 2022-03-15 ENCOUNTER — Ambulatory Visit (INDEPENDENT_AMBULATORY_CARE_PROVIDER_SITE_OTHER): Payer: Medicare PPO | Admitting: Internal Medicine

## 2022-03-15 DIAGNOSIS — H65112 Acute and subacute allergic otitis media (mucoid) (sanguinous) (serous), left ear: Secondary | ICD-10-CM | POA: Diagnosis not present

## 2022-03-15 DIAGNOSIS — H6692 Otitis media, unspecified, left ear: Secondary | ICD-10-CM | POA: Insufficient documentation

## 2022-03-15 MED ORDER — OFLOXACIN 0.3 % OT SOLN: 5.0000 [drp] | Freq: Two times a day (BID) | OTIC | 0 refills | Status: DC

## 2022-03-15 MED ORDER — AZITHROMYCIN 250 MG PO TABS: ORAL_TABLET | ORAL | 0 refills | Status: DC

## 2022-03-15 NOTE — Assessment & Plan Note (Signed)
Unclear if tube might be clogged---will need to see ENT to be sure no cholesteatoma, etc Does have dullness so will try antibiotic--azithromycin due to PCN allergy floxin drops as well

## 2022-03-15 NOTE — Progress Notes (Signed)
Subjective:    Patient ID: [name], female    DOB: 13-Jul-1946, 76 y.o.   MRN: [id number]  HPI Here due to left ear problems  Has had allergies since moving here 40 years ago Gets recurrent fluid and infections in left ear Has had tubes in --but has noticed pressure about a week ago No fever No cough No sore throat Slight drainage  Gets allergy shots and levocetirizine And astepro and flonase  Current Outpatient Medications on File Prior to Visit  Medication Sig Dispense Refill   ALPRAZolam (XANAX) 0.25 MG tablet TAKE ONE TABLET AT BEDTIME AS NEEDED FORANXIETY 90 tablet 0   Azelastine HCl (ASTEPRO) 0.15 % SOLN Place 1 spray into the nose at bedtime.     Calcium Carbonate-Vitamin D (CALCIUM 600+D PO) Take 1 tablet by mouth 2 (two) times daily.      EPINEPHrine 0.3 mg/0.3 mL IJ SOAJ injection Inject 0.3 mg into the skin once. as needed.     estradiol (ESTRACE) 0.1 MG/GM vaginal cream Place 1 Applicatorful vaginally 3 (three) times a week.     levocetirizine (XYZAL) 5 MG tablet Take 1 tablet by mouth daily.     levothyroxine (SYNTHROID) 50 MCG tablet TAKE 1 TABLET EVERY DAY ON EMPTY STOMACHWITH A GLASS OF WATER AT LEAST 30-60 MINBEFORE BREAKFAST 90 tablet 2   Melatonin 10 MG TABS Take 1 tablet by mouth at bedtime.     Omega-3 Fatty Acids (FISH OIL) 1200 MG CAPS Take 1 capsule by mouth daily.      ramipril (ALTACE) 10 MG capsule TAKE 1 CAPSULE BY MOUTH EVERY DAY 90 capsule 2   sertraline (ZOLOFT) 100 MG tablet TAKE 1 TABLET BY MOUTH DAILY 90 tablet 3   triamterene-hydrochlorothiazide (MAXZIDE-25) 37.5-25 MG tablet TAKE 1/2 TABLET EVERYDAY 45 tablet 2   Vibegron (GEMTESA) 75 MG TABS Take 1 tablet by mouth daily.     No current facility-administered medications on file prior to visit.    Allergies  Allergen Reactions   Atorvastatin     REACTION: shoulder pain   Ezetimibe     REACTION: ?   Lisinopril     REACTION: muscle pain   Molds & Smuts    Penicillamine     Penicillins     REACTION: reaction not known Has patient had a PCN reaction causing immediate rash, facial/tongue/throat swelling, SOB or lightheadedness with hypotension: unknown Has patient had a PCN reaction causing severe rash involving mucus membranes or skin necrosis: unknown Has patient had a PCN reaction that required hospitalization unsure Has patient had a PCN reaction occurring within the last 10 years: no If all of the above answers are "NO", then may proceed with Cephalosporin use.   Pneumococcal Vaccine Swelling    REACTION: severe local reaction   Pneumococcal Vaccine Polyvalent     REACTION: severe local reaction   Pollen Extract    Rosuvastatin     REACTION: leg pain   Simvastatin     REACTION: muscle pain    Past Medical History:  Diagnosis Date   Allergic rhinitis    gets allergy shot from Dr. Carlis Abbott   Anxiety    Arthritis    Diabetes mellitus without complication ([place])    diet controlled   GERD (gastroesophageal reflux disease)    History of shingles    at age 14- mild   Hx of colonic polyp    Hyperglycemia    Hyperlipemia    Very high chol intol of statins-does not  want to check it   Hypertension    Panic disorder    Tremor    from anx   Urinary incontinence    mixed   Voice disorder     Past Surgical History:  Procedure Laterality Date   ABDOMINAL HYSTERECTOMY     BLADDER SUSPENSION     COLONOSCOPY WITH PROPOFOL N/A 12/24/2016   Procedure: COLONOSCOPY WITH PROPOFOL;  Surgeon: Manya Silvas, MD;  Location: [facility] ENDOSCOPY;  Service: Endoscopy;  Laterality: N/A;   COLONOSCOPY WITH PROPOFOL N/A 04/17/2021   Procedure: COLONOSCOPY WITH PROPOFOL;  Surgeon: Lesly Rubenstein, MD;  Location: [facility] ENDOSCOPY;  Service: Endoscopy;  Laterality: N/A;   ESOPHAGOGASTRODUODENOSCOPY (EGD) WITH PROPOFOL N/A 12/24/2016   Procedure: ESOPHAGOGASTRODUODENOSCOPY (EGD) WITH PROPOFOL;  Surgeon: Manya Silvas, MD;  Location: [facility] ENDOSCOPY;  Service: Endoscopy;   Laterality: N/A;   EYE SURGERY Bilateral 2000   eye lens replacement   HAND SURGERY Left 2016   left long trigger finger release   JOINT REPLACEMENT     knee replacement - march 15   KNEE ARTHROSCOPY Left 04/26/2016   Procedure: ARTHROSCOPY KNEE;  Surgeon: Hessie Knows, MD;  Location: [facility] ORS;  Service: Orthopedics;  Laterality: Left;   MEDIAL PARTIAL KNEE REPLACEMENT Left 2016   NASAL SINUS SURGERY     PARTIAL HYSTERECTOMY     with fibroid   RECTOCELE REPAIR     SYNOVECTOMY Left 04/26/2016   Procedure: PARTIAL SYNOVECTOMY;  Surgeon: Hessie Knows, MD;  Location: [facility] ORS;  Service: Orthopedics;  Laterality: Left;   TONSILLECTOMY      Family History  Problem Relation Age of Onset   Diabetes Mother    Hyperlipidemia Mother    Hypertension Mother    Osteoarthritis Mother    Cancer Father        Colon   Leukemia Sister    Syncope episode Daughter    Breast cancer Neg Hx     Social History   Socioeconomic History   Marital status: Married    Spouse name: Not on file   Number of children: 2   Years of education: Not on file   Highest education level: Not on file  Occupational History   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  Tobacco Use   Smoking status: Never    Passive exposure: Never   Smokeless tobacco: Never  Vaping Use   Vaping Use: Never used  Substance and Sexual Activity   Alcohol use: Yes    Comment: rare   Drug use: No   Sexual activity: Not Currently    Birth control/protection: None  Other Topics Concern   Not on file  Social History Narrative   Not on file   Social Determinants of Health   Financial Resource Strain: Not on file  Food Insecurity: Not on file  Transportation Needs: Not on file  Physical Activity: Insufficiently Active (04/13/2020)   Exercise Vital Sign    Days of Exercise per Week: 4 days    Minutes of Exercise per Session: 30 min  Stress: Not on file  Social Connections: Not on file  Intimate  Partner Violence: Not on file   Review of Systems Some watering in eyes--left worse No post nasal drip    Objective:   Physical Exam Constitutional:      Appearance: Normal appearance.  HENT:     Right Ear: Tympanic membrane and ear canal normal.     Ears:     Comments:  Left--large tube is in place inner quadrant. Appears patent on this side--but TM is retracted, dull and seems to have fluid behind it Musculoskeletal:     Cervical back: Neck supple.  Lymphadenopathy:     Cervical: No cervical adenopathy.  Neurological:     Mental Status: She is alert.            Assessment & Plan:

## 2022-03-19 DIAGNOSIS — X32XXXA Exposure to sunlight, initial encounter: Secondary | ICD-10-CM | POA: Diagnosis not present

## 2022-03-19 DIAGNOSIS — L570 Actinic keratosis: Secondary | ICD-10-CM | POA: Diagnosis not present

## 2022-03-22 DIAGNOSIS — H6982 Other specified disorders of Eustachian tube, left ear: Secondary | ICD-10-CM | POA: Diagnosis not present

## 2022-03-22 DIAGNOSIS — H6122 Impacted cerumen, left ear: Secondary | ICD-10-CM | POA: Diagnosis not present

## 2022-03-22 DIAGNOSIS — H938X2 Other specified disorders of left ear: Secondary | ICD-10-CM | POA: Diagnosis not present

## 2022-03-26 DIAGNOSIS — J301 Allergic rhinitis due to pollen: Secondary | ICD-10-CM | POA: Diagnosis not present

## 2022-03-29 ENCOUNTER — Other Ambulatory Visit: Payer: Self-pay | Admitting: Family Medicine

## 2022-03-29 DIAGNOSIS — J301 Allergic rhinitis due to pollen: Secondary | ICD-10-CM | POA: Diagnosis not present

## 2022-04-02 ENCOUNTER — Other Ambulatory Visit: Payer: Self-pay | Admitting: Family Medicine

## 2022-04-02 NOTE — Telephone Encounter (Signed)
Last filled 02/28/22 Last ov 11/07/21

## 2022-04-17 ENCOUNTER — Telehealth: Payer: Self-pay | Admitting: Family Medicine

## 2022-04-17 DIAGNOSIS — I10 Essential (primary) hypertension: Secondary | ICD-10-CM

## 2022-04-17 DIAGNOSIS — R7303 Prediabetes: Secondary | ICD-10-CM

## 2022-04-17 DIAGNOSIS — E039 Hypothyroidism, unspecified: Secondary | ICD-10-CM

## 2022-04-17 DIAGNOSIS — E7800 Pure hypercholesterolemia, unspecified: Secondary | ICD-10-CM

## 2022-04-17 NOTE — Telephone Encounter (Signed)
-----   Message from Ellamae Sia sent at 04/02/2022  2:46 PM EDT ----- Regarding: Lab orders for Wednesday 8.2.23 Patient is scheduled for CPX labs, please order future labs, Thanks , Karna Christmas

## 2022-04-18 ENCOUNTER — Other Ambulatory Visit (INDEPENDENT_AMBULATORY_CARE_PROVIDER_SITE_OTHER): Payer: Medicare PPO

## 2022-04-18 DIAGNOSIS — R7303 Prediabetes: Secondary | ICD-10-CM

## 2022-04-18 DIAGNOSIS — E039 Hypothyroidism, unspecified: Secondary | ICD-10-CM | POA: Diagnosis not present

## 2022-04-18 DIAGNOSIS — E7800 Pure hypercholesterolemia, unspecified: Secondary | ICD-10-CM | POA: Diagnosis not present

## 2022-04-18 DIAGNOSIS — I10 Essential (primary) hypertension: Secondary | ICD-10-CM

## 2022-04-18 LAB — CBC WITH DIFFERENTIAL/PLATELET
Basophils Absolute: 0.1 10*3/uL (ref 0.0–0.1)
Basophils Relative: 1 % (ref 0.0–3.0)
Eosinophils Absolute: 0.3 10*3/uL (ref 0.0–0.7)
Eosinophils Relative: 4 % (ref 0.0–5.0)
HCT: 42.3 % (ref 36.0–46.0)
Hemoglobin: 14.2 g/dL (ref 12.0–15.0)
Lymphocytes Relative: 23.3 % (ref 12.0–46.0)
Lymphs Abs: 1.8 10*3/uL (ref 0.7–4.0)
MCHC: 33.5 g/dL (ref 30.0–36.0)
MCV: 83 fl (ref 78.0–100.0)
Monocytes Absolute: 0.8 10*3/uL (ref 0.1–1.0)
Monocytes Relative: 10.4 % (ref 3.0–12.0)
Neutro Abs: 4.8 10*3/uL (ref 1.4–7.7)
Neutrophils Relative %: 61.3 % (ref 43.0–77.0)
Platelets: 273 10*3/uL (ref 150.0–400.0)
RBC: 5.09 Mil/uL (ref 3.87–5.11)
RDW: 14.7 % (ref 11.5–15.5)
WBC: 7.8 10*3/uL (ref 4.0–10.5)

## 2022-04-18 LAB — COMPREHENSIVE METABOLIC PANEL
ALT: 14 U/L (ref 0–35)
AST: 16 U/L (ref 0–37)
Albumin: 4.2 g/dL (ref 3.5–5.2)
Alkaline Phosphatase: 73 U/L (ref 39–117)
BUN: 26 mg/dL — ABNORMAL HIGH (ref 6–23)
CO2: 26 mEq/L (ref 19–32)
Calcium: 9.3 mg/dL (ref 8.4–10.5)
Chloride: 101 mEq/L (ref 96–112)
Creatinine, Ser: 1.1 mg/dL (ref 0.40–1.20)
GFR: 49.01 mL/min — ABNORMAL LOW (ref >60.00)
Glucose, Bld: 104 mg/dL — ABNORMAL HIGH (ref 70–99)
Potassium: 4.6 mEq/L (ref 3.5–5.1)
Sodium: 138 mEq/L (ref 135–145)
Total Bilirubin: 0.6 mg/dL (ref 0.2–1.2)
Total Protein: 6.4 g/dL (ref 6.0–8.3)

## 2022-04-18 LAB — HEMOGLOBIN A1C: Hgb A1c MFr Bld: 6.7 % — ABNORMAL HIGH (ref 4.6–6.5)

## 2022-04-18 LAB — LIPID PANEL
Cholesterol: 313 mg/dL — ABNORMAL HIGH (ref 0–200)
HDL: 41.2 mg/dL (ref >39.00)
LDL Cholesterol: 250 mg/dL — ABNORMAL HIGH (ref 0–99)
NonHDL: 272.07
Total CHOL/HDL Ratio: 8
Triglycerides: 110 mg/dL (ref 0.0–149.0)
VLDL: 22 mg/dL (ref 0.0–40.0)

## 2022-04-18 LAB — TSH: TSH: 3.41 u[IU]/mL (ref 0.35–5.50)

## 2022-04-19 ENCOUNTER — Ambulatory Visit: Payer: Medicare PPO | Admitting: Dermatology

## 2022-04-23 ENCOUNTER — Encounter: Payer: Self-pay | Admitting: Family Medicine

## 2022-04-23 ENCOUNTER — Ambulatory Visit (INDEPENDENT_AMBULATORY_CARE_PROVIDER_SITE_OTHER): Payer: Medicare PPO | Admitting: Family Medicine

## 2022-04-23 VITALS — BP 108/66 | HR 91 | Ht 63.78 in | Wt 172.0 lb

## 2022-04-23 DIAGNOSIS — R7303 Prediabetes: Secondary | ICD-10-CM | POA: Diagnosis not present

## 2022-04-23 DIAGNOSIS — E039 Hypothyroidism, unspecified: Secondary | ICD-10-CM | POA: Diagnosis not present

## 2022-04-23 DIAGNOSIS — E7800 Pure hypercholesterolemia, unspecified: Secondary | ICD-10-CM

## 2022-04-23 DIAGNOSIS — I10 Essential (primary) hypertension: Secondary | ICD-10-CM

## 2022-04-23 DIAGNOSIS — M85859 Other specified disorders of bone density and structure, unspecified thigh: Secondary | ICD-10-CM | POA: Diagnosis not present

## 2022-04-23 DIAGNOSIS — Z0000 Encounter for general adult medical examination without abnormal findings: Secondary | ICD-10-CM

## 2022-04-23 DIAGNOSIS — H65192 Other acute nonsuppurative otitis media, left ear: Secondary | ICD-10-CM

## 2022-04-23 DIAGNOSIS — L989 Disorder of the skin and subcutaneous tissue, unspecified: Secondary | ICD-10-CM | POA: Diagnosis not present

## 2022-04-23 DIAGNOSIS — E669 Obesity, unspecified: Secondary | ICD-10-CM | POA: Diagnosis not present

## 2022-04-23 DIAGNOSIS — N3946 Mixed incontinence: Secondary | ICD-10-CM

## 2022-04-23 DIAGNOSIS — H65112 Acute and subacute allergic otitis media (mucoid) (sanguinous) (serous), left ear: Secondary | ICD-10-CM

## 2022-04-23 MED ORDER — RAMIPRIL 5 MG PO CAPS: 5.0000 mg | ORAL_CAPSULE | Freq: Every day | ORAL | 3 refills | Status: DC

## 2022-04-23 MED ORDER — SERTRALINE HCL 100 MG PO TABS
100.0000 mg | ORAL_TABLET | Freq: Every day | ORAL | 3 refills | Status: DC
Start: 2022-04-23 — End: 2023-04-26

## 2022-04-23 MED ORDER — GEMTESA 75 MG PO TABS: 1.0000 | ORAL_TABLET | Freq: Every day | ORAL | 3 refills | Status: DC

## 2022-04-23 NOTE — Assessment & Plan Note (Signed)
bp in fair control at this time  BP Readings from Last 1 Encounters:  04/23/22 108/66    Most recent labs reviewed  Disc lifstyle change with low sodium diet and exercise   some light headedness with standing  Will cut the altace dose from 10 to 5 mg daily  Continue triam/hct 37.5-25 mg daily  If no improvement inst to f/u

## 2022-04-23 NOTE — Assessment & Plan Note (Signed)
Severely high LDL  Intol to all statins/even intermittent and zetia  In past ins would not pay for PCYK9  Curious to know if it would now  May discuss with our pharmacist

## 2022-04-23 NOTE — Assessment & Plan Note (Signed)
Hypothyroidism  Pt has no clinical changes No change in energy level/ hair or skin/ edema and no tremor Lab Results  Component Value Date   TSH 3.41 04/18/2022    Plan to continue levothyroxine 50 mcg daily

## 2022-04-23 NOTE — Assessment & Plan Note (Signed)
Reviewed health habits including diet and exercise and skin cancer prevention Reviewed appropriate screening tests for age  Also reviewed health mt list, fam hx and immunization status , as well as social and family history   See HPI Labs reviewed  Plans flu shot in fall  No pna shot due to side eff Mammogram due in October colonosc utd from 04/2021 dexa utd 04/2021- no recent fx and taking vitD

## 2022-04-23 NOTE — Assessment & Plan Note (Signed)
Recent Has tube in ear Will f/u with ENT tomorrow-is still draining per pt

## 2022-04-23 NOTE — Assessment & Plan Note (Signed)
utd dexa 04/2021 One fall with syncope-no injury  No fractures Doing PT for exercise/limited due to back trouble

## 2022-04-23 NOTE — Assessment & Plan Note (Signed)
gemtasa works well No changes We will take this over from urology

## 2022-04-23 NOTE — Progress Notes (Signed)
Subjective:    Patient ID: [name], female    DOB: 10-05-1945, 76 y.o.   MRN: [id number]  HPI Here for health maintenance exam and to review chronic medical problems    Wants [place] to take over her gemtasa   Wt Readings from Last 3 Encounters:  04/23/22 172 lb (78 kg)  03/15/22 175 lb (79.4 kg)  03/08/22 172 lb (78 kg)   29.73 kg/m   Immunization History  Administered Date(s) Administered   H1N1 10/04/2008   Hepatitis B 01/16/1992, 02/16/1992, 08/17/1992   Influenza Split 07/18/2011, 06/04/2012   Influenza Whole 06/17/2008, 07/19/2009, 07/11/2010   Influenza, High Dose Seasonal PF 06/05/2017, 06/25/2019   Influenza,inj,Quad PF,6+ Mos 06/02/2013, 06/13/2016, 06/30/2018   Influenza,trivalent, recombinat, inj, PF 06/09/2015   Influenza-Unspecified 06/10/2014, 06/30/2018, 06/25/2019   PFIZER(Purple Top)SARS-COV-2 Vaccination 10/27/2019, 11/17/2019   Pneumococcal Polysaccharide-23 09/17/2002   Td 10/04/2008   Zoster Recombinat (Shingrix) 05/07/2018, 09/18/2018   Zoster, Live 05/25/2009   Health Maintenance Due  Topic Date Due   COVID-19 Vaccine (3 - Pfizer series) 01/12/2020   INFLUENZA VACCINE  04/17/2022   Will get a flu shot in the fall   Had OM Was treated  Still bothers her- sees ENT tomorrow    No pna vaccine due to side effets   Mammogram 06/2021 Self breast exam: no lumps  No gyn problems   Colonoscopy is utd 8/22   Dexa up to date - 04/2021 Osteopenia   Falls: none (does occ get light headed when she has ibs Over a year ago she did his her head and went to ER / everything was ok   Fractures:none  Supplements : ca with D Exercise : went to PT , does back exercises that get heart rate up   HTN bp is stable today  No cp or palpitations or headaches or edema  No side effects to medicines  BP Readings from Last 3 Encounters:  04/23/22 108/66  03/15/22 110/60  03/08/22 138/78     Triam hct 37.5-25 Altace 10  Some light headed    Lab  Results  Component Value Date   CREATININE 1.10 04/18/2022   BUN 26 (H) 04/18/2022   NA 138 04/18/2022   K 4.6 04/18/2022   CL 101 04/18/2022   CO2 26 04/18/2022    Hypothyroidism  Pt has no clinical changes No change in energy level/ hair or skin/ edema and no tremor Lab Results  Component Value Date   TSH 3.41 04/18/2022    Levothyroxine 50 mcg daily    Prediabetes Lab Results  Component Value Date   HGBA1C 6.7 (H) 04/18/2022   This is up a bit  Thinks it went up when she was sick  Also had a few trips   Hyperlipidemia Lab Results  Component Value Date   CHOL 313 (H) 04/18/2022   CHOL 327 (H) 04/19/2021   CHOL 359 (H) 04/07/2020   Lab Results  Component Value Date   HDL 41.20 04/18/2022   HDL 42.40 04/19/2021   HDL 46.50 04/07/2020   Lab Results  Component Value Date   LDLCALC 250 (H) 04/18/2022   LDLCALC 258 (H) 04/19/2021   LDLCALC 286 (H) 04/07/2020   Lab Results  Component Value Date   TRIG 110.0 04/18/2022   TRIG 133.0 04/19/2021   TRIG 132.0 04/07/2020   Lab Results  Component Value Date   CHOLHDL 8 04/18/2022   CHOLHDL 8 04/19/2021   CHOLHDL 8 04/07/2020   Lab Results  Component Value Date   LDLDIRECT 242.8 01/15/2012   LDLDIRECT 237.0 12/18/2010   LDLDIRECT 227.8 09/20/2009   Intol to any statin or zetia Has been to the lipid clinic Cannot afford [id number]  Patient Active Problem List   Diagnosis Date Noted   Left otitis media 03/15/2022   Lumbar degenerative disc disease 11/07/2021   Skin lesion of hand 11/07/2021   Encounter for screening mammogram for breast cancer 04/21/2021   Osteopenia 02/13/2016   Routine general medical examination at a health care facility 01/23/2016   Estrogen deficiency 01/23/2016   Hypothyroid 01/23/2016   Mild depression 02/21/2015   H/O diabetes mellitus 01/04/2015   H/O arthritis 01/04/2015   History of hay fever 01/04/2015   Encounter for Medicare annual wellness exam 01/14/2013   Obesity (BMI  30-39.9) 01/15/2012   MENOPAUSAL SYNDROME 10/04/2008   Prediabetes 12/02/2007   TREMOR 12/02/2007   Hyperlipidemia 01/28/2007   Generalized anxiety disorder 01/28/2007   Essential hypertension 01/28/2007   GERD 01/28/2007   URINARY INCONTINENCE 01/28/2007   COLONIC POLYPS, HX OF 01/28/2007   HERPES ZOSTER 01/20/2007   DYSPHONIA 01/20/2007   COUGH, CHRONIC 01/20/2007   ALLERGY 01/20/2007   Past Medical History:  Diagnosis Date   Allergic rhinitis    gets allergy shot from Dr. Carlis Abbott   Anxiety    Arthritis    Diabetes mellitus without complication ([place])    diet controlled   GERD (gastroesophageal reflux disease)    History of shingles    at age 69- mild   Hx of colonic polyp    Hyperglycemia    Hyperlipemia    Very high chol intol of statins-does not want to check it   Hypertension    Panic disorder    Tremor    from anx   Urinary incontinence    mixed   Voice disorder    Past Surgical History:  Procedure Laterality Date   ABDOMINAL HYSTERECTOMY     BLADDER SUSPENSION     COLONOSCOPY WITH PROPOFOL N/A 12/24/2016   Procedure: COLONOSCOPY WITH PROPOFOL;  Surgeon: Manya Silvas, MD;  Location: [facility] ENDOSCOPY;  Service: Endoscopy;  Laterality: N/A;   COLONOSCOPY WITH PROPOFOL N/A 04/17/2021   Procedure: COLONOSCOPY WITH PROPOFOL;  Surgeon: Lesly Rubenstein, MD;  Location: [facility] ENDOSCOPY;  Service: Endoscopy;  Laterality: N/A;   ESOPHAGOGASTRODUODENOSCOPY (EGD) WITH PROPOFOL N/A 12/24/2016   Procedure: ESOPHAGOGASTRODUODENOSCOPY (EGD) WITH PROPOFOL;  Surgeon: Manya Silvas, MD;  Location: [facility] ENDOSCOPY;  Service: Endoscopy;  Laterality: N/A;   EYE SURGERY Bilateral 2000   eye lens replacement   HAND SURGERY Left 2016   left long trigger finger release   JOINT REPLACEMENT     knee replacement - march 15   KNEE ARTHROSCOPY Left 04/26/2016   Procedure: ARTHROSCOPY KNEE;  Surgeon: Hessie Knows, MD;  Location: [facility] ORS;  Service: Orthopedics;  Laterality: Left;   MEDIAL  PARTIAL KNEE REPLACEMENT Left 2016   NASAL SINUS SURGERY     PARTIAL HYSTERECTOMY     with fibroid   RECTOCELE REPAIR     SYNOVECTOMY Left 04/26/2016   Procedure: PARTIAL SYNOVECTOMY;  Surgeon: Hessie Knows, MD;  Location: [facility] ORS;  Service: Orthopedics;  Laterality: Left;   TONSILLECTOMY     Social History   Tobacco Use   Smoking status: Never    Passive exposure: Never   Smokeless tobacco: Never  Vaping Use   Vaping Use: Never used  Substance Use Topics   Alcohol use: Yes    Comment:  rare   Drug use: No   Family History  Problem Relation Age of Onset   Diabetes Mother    Hyperlipidemia Mother    Hypertension Mother    Osteoarthritis Mother    Cancer Father        Colon   Leukemia Sister    Syncope episode Daughter    Breast cancer Neg Hx    Allergies  Allergen Reactions   Atorvastatin     REACTION: shoulder pain   Ezetimibe     REACTION: ?   Lisinopril     REACTION: muscle pain   Molds & Smuts    Penicillamine    Penicillins     REACTION: reaction not known Has patient had a PCN reaction causing immediate rash, facial/tongue/throat swelling, SOB or lightheadedness with hypotension: unknown Has patient had a PCN reaction causing severe rash involving mucus membranes or skin necrosis: unknown Has patient had a PCN reaction that required hospitalization unsure Has patient had a PCN reaction occurring within the last 10 years: no If all of the above answers are "NO", then may proceed with Cephalosporin use.   Pneumococcal Vaccine Swelling    REACTION: severe local reaction   Pneumococcal Vaccine Polyvalent     REACTION: severe local reaction   Pollen Extract    Rosuvastatin     REACTION: leg pain   Simvastatin     REACTION: muscle pain   Current Outpatient Medications on File Prior to Visit  Medication Sig Dispense Refill   ALPRAZolam (XANAX) 0.25 MG tablet TAKE ONE TABLET AT BEDTIME AS NEEDED FORANXIETY 90 tablet 0   Azelastine HCl (ASTEPRO) 0.15 % SOLN  Place 1 spray into the nose at bedtime.     Calcium Carbonate-Vitamin D (CALCIUM 600+D PO) Take 1 tablet by mouth 2 (two) times daily.      EPINEPHrine 0.3 mg/0.3 mL IJ SOAJ injection Inject 0.3 mg into the skin once. as needed.     estradiol (ESTRACE) 0.1 MG/GM vaginal cream Place 1 Applicatorful vaginally 3 (three) times a week.     levocetirizine (XYZAL) 5 MG tablet Take 1 tablet by mouth daily.     levothyroxine (SYNTHROID) 50 MCG tablet TAKE 1 TABLET EVERY DAY ON EMPTY STOMACHWITH A GLASS OF WATER AT LEAST 30-60 MINBEFORE BREAKFAST 90 tablet 2   Melatonin 10 MG TABS Take 1 tablet by mouth at bedtime.     Omega-3 Fatty Acids (FISH OIL) 1200 MG CAPS Take 1 capsule by mouth daily.      omeprazole (PRILOSEC) 20 MG capsule TAKE 2 CAPSULES BY MOUTH EVERY MORNING AND 1 CAPSULE IN THE EVENING 270 capsule 2   triamterene-hydrochlorothiazide (MAXZIDE-25) 37.5-25 MG tablet TAKE 1/2 TABLET EVERYDAY 45 tablet 2   No current facility-administered medications on file prior to visit.    Review of Systems  Constitutional:  Positive for fatigue. Negative for activity change, appetite change, fever and unexpected weight change.  HENT:  Positive for ear discharge and ear pain. Negative for congestion, rhinorrhea, sinus pressure and sore throat.   Eyes:  Negative for pain, redness and visual disturbance.  Respiratory:  Negative for cough, shortness of breath and wheezing.   Cardiovascular:  Negative for chest pain and palpitations.  Gastrointestinal:  Negative for abdominal pain, blood in stool, constipation and diarrhea.  Endocrine: Negative for polydipsia and polyuria.  Genitourinary:  Negative for dysuria, frequency and urgency.  Musculoskeletal:  Negative for arthralgias, back pain and myalgias.  Skin:  Negative for pallor and rash.  Allergic/Immunologic:  Negative for environmental allergies.  Neurological:  Negative for dizziness, syncope and headaches.  Hematological:  Negative for adenopathy. Does  not bruise/bleed easily.  Psychiatric/Behavioral:  Negative for decreased concentration and dysphoric mood. The patient is not nervous/anxious.        Objective:   Physical Exam Constitutional:      General: She is not in acute distress.    Appearance: Normal appearance. She is well-developed. She is obese. She is not ill-appearing or diaphoretic.  HENT:     Head: Normocephalic and atraumatic.     Right Ear: Tympanic membrane, ear canal and external ear normal.     Left Ear: Tympanic membrane, ear canal and external ear normal.     Nose: Nose normal. No congestion.     Mouth/Throat:     Mouth: Mucous membranes are moist.     Pharynx: Oropharynx is clear. No posterior oropharyngeal erythema.  Eyes:     General: No scleral icterus.    Extraocular Movements: Extraocular movements intact.     Conjunctiva/sclera: Conjunctivae normal.     Pupils: Pupils are equal, round, and reactive to light.  Neck:     Thyroid: No thyromegaly.     Vascular: No carotid bruit or JVD.  Cardiovascular:     Rate and Rhythm: Normal rate and regular rhythm.     Pulses: Normal pulses.     Heart sounds: Normal heart sounds.     No gallop.  Pulmonary:     Effort: Pulmonary effort is normal. No respiratory distress.     Breath sounds: Normal breath sounds. No wheezing.     Comments: Good air exch Chest:     Chest wall: No tenderness.  Abdominal:     General: Bowel sounds are normal. There is no distension or abdominal bruit.     Palpations: Abdomen is soft. There is no mass.     Tenderness: There is no abdominal tenderness.     Hernia: No hernia is present.  Genitourinary:    Comments: Breast exam: No mass, nodules, thickening, tenderness, bulging, retraction, inflamation, nipple discharge or skin changes noted.  No axillary or clavicular LA.     Musculoskeletal:        General: No tenderness. Normal range of motion.     Cervical back: Normal range of motion and neck supple. No rigidity. No muscular  tenderness.     Right lower leg: No edema.     Left lower leg: No edema.     Comments: No kyphosis   Lymphadenopathy:     Cervical: No cervical adenopathy.  Skin:    General: Skin is warm and dry.     Coloration: Skin is not pale.     Findings: No erythema or rash.     Comments: Fair   3-4 mm pink area with scale on R hand base of thumb  Neurological:     Mental Status: She is alert. Mental status is at baseline.     Cranial Nerves: No cranial nerve deficit.     Motor: No abnormal muscle tone.     Coordination: Coordination normal.     Gait: Gait normal.     Deep Tendon Reflexes: Reflexes are normal and symmetric. Reflexes normal.  Psychiatric:        Mood and Affect: Mood normal.        Cognition and Memory: Cognition and memory normal.           Assessment & Plan:   Problem List Items Addressed  This Visit       Cardiovascular and Mediastinum   Essential hypertension    bp in fair control at this time  BP Readings from Last 1 Encounters:  04/23/22 108/66   Most recent labs reviewed  Disc lifstyle change with low sodium diet and exercise   some light headedness with standing  Will cut the altace dose from 10 to 5 mg daily  Continue triam/hct 37.5-25 mg daily  If no improvement inst to f/u      Relevant Medications   ramipril (ALTACE) 5 MG capsule     Endocrine   Hypothyroid    Hypothyroidism  Pt has no clinical changes No change in energy level/ hair or skin/ edema and no tremor Lab Results  Component Value Date   TSH 3.41 04/18/2022    Plan to continue levothyroxine 50 mcg daily          Nervous and Auditory   Left otitis media    Recent Has tube in ear Will f/u with ENT tomorrow-is still draining per pt         Musculoskeletal and Integument   Osteopenia    utd dexa 04/2021 One fall with syncope-no injury  No fractures Doing PT for exercise/limited due to back trouble      Skin lesion of hand    Was treated with cryo and chem tx by  derm Now pink area remains/slt scale Recommend derm f/u        Other   Hyperlipidemia    Severely high LDL  Intol to all statins/even intermittent and zetia  In past ins would not pay for PCYK9  Curious to know if it would now  May discuss with our pharmacist       Relevant Medications   ramipril (ALTACE) 5 MG capsule   Obesity (BMI 30-39.9)    Discussed how this problem influences overall health and the risks it imposes  Reviewed plan for weight loss with lower calorie diet (via better food choices and also portion control or program like weight watchers) and exercise building up to or more than 30 minutes 5 days per week including some aerobic activity         Prediabetes    Lab Results  Component Value Date   HGBA1C 6.7 (H) 04/18/2022  disc imp of low glycemic diet and wt loss to prevent DM2  F/u in 4 mo      Routine general medical examination at a health care facility - Primary    Reviewed health habits including diet and exercise and skin cancer prevention Reviewed appropriate screening tests for age  Also reviewed health mt list, fam hx and immunization status , as well as social and family history   See HPI Labs reviewed  Plans flu shot in fall  No pna shot due to side eff Mammogram due in October colonosc utd from 04/2021 dexa utd 04/2021- no recent fx and taking vitD

## 2022-04-23 NOTE — Assessment & Plan Note (Signed)
Was treated with cryo and chem tx by derm Now pink area remains/slt scale Recommend derm f/u

## 2022-04-23 NOTE — Patient Instructions (Addendum)
Get your flu shot shot in the fall   For sugar control Try to get most of your carbohydrates from produce (with the exception of white potatoes)  Eat less bread/pasta/rice/snack foods/cereals/sweets and other items from the middle of the grocery store (processed carbs)   Follow up in 4 months for blood sugar   I want to talk to our pharmacist Ria Comment about your cholesterol  Perhaps the injectable medicine may be covered in the future  I may consult her if needed   Take care of yourself! See dermatologist about the spot on your hand

## 2022-04-23 NOTE — Assessment & Plan Note (Signed)
Lab Results  Component Value Date   HGBA1C 6.7 (H) 04/18/2022   disc imp of low glycemic diet and wt loss to prevent DM2  F/u in 4 mo

## 2022-04-23 NOTE — Assessment & Plan Note (Signed)
Discussed how this problem influences overall health and the risks it imposes  Reviewed plan for weight loss with lower calorie diet (via better food choices and also portion control or program like weight watchers) and exercise building up to or more than 30 minutes 5 days per week including some aerobic activity    

## 2022-04-24 DIAGNOSIS — H6982 Other specified disorders of Eustachian tube, left ear: Secondary | ICD-10-CM | POA: Diagnosis not present

## 2022-04-24 DIAGNOSIS — H9212 Otorrhea, left ear: Secondary | ICD-10-CM | POA: Diagnosis not present

## 2022-04-24 DIAGNOSIS — J301 Allergic rhinitis due to pollen: Secondary | ICD-10-CM | POA: Diagnosis not present

## 2022-04-25 ENCOUNTER — Ambulatory Visit (INDEPENDENT_AMBULATORY_CARE_PROVIDER_SITE_OTHER): Payer: Medicare PPO | Admitting: *Deleted

## 2022-04-25 DIAGNOSIS — D485 Neoplasm of uncertain behavior of skin: Secondary | ICD-10-CM | POA: Diagnosis not present

## 2022-04-25 DIAGNOSIS — Z0000 Encounter for general adult medical examination without abnormal findings: Secondary | ICD-10-CM

## 2022-04-25 DIAGNOSIS — L280 Lichen simplex chronicus: Secondary | ICD-10-CM | POA: Diagnosis not present

## 2022-04-25 NOTE — Progress Notes (Signed)
Subjective:   [name] is a 76 y.o. female who presents for Medicare Annual (Subsequent) preventive examination.  I connected with  Iva Boop on 04/25/22 by a telephone enabled telemedicine application and verified that I am speaking with the correct person using two identifiers.   I discussed the limitations of evaluation and management by telemedicine. The patient expressed understanding and agreed to proceed.  Patient location: home  Provider location: Tele-health-home    Review of Systems     Cardiac Risk Factors include: advanced age (>58mn, >>48women);hypertension     Objective:    Today's Vitals   There is no height or weight on file to calculate BMI.     04/25/2022    9:04 AM 03/08/2022    3:42 PM 04/17/2021   12:39 PM 04/13/2020    1:14 PM 04/03/2019    3:07 PM 03/28/2018   10:24 AM 07/08/2017    9:57 AM  Advanced Directives  Does Patient Have a Medical Advance Directive? Yes No Yes Yes Yes Yes   Type of AMaterials engineerof [place]Living will H[place]Living will [place]Living will [place]Living will   Does patient want to make changes to medical advance directive?     No - Patient declined    Copy of H[place] Chart? Yes - validated most recent copy scanned in chart (See row information)   No - copy requested No - copy requested No - copy requested   Would patient like information on creating a medical advance directive?  No - Patient declined          Information is confidential and restricted. Go to Review Flowsheets to unlock data.    Current Medications (verified) Outpatient Encounter Medications as of 04/25/2022  Medication Sig   ALPRAZolam (XANAX) 0.25 MG tablet TAKE ONE TABLET AT BEDTIME AS NEEDED FORANXIETY   Azelastine HCl (ASTEPRO) 0.15 % SOLN Place 1 spray into the nose at bedtime.   Calcium Carbonate-Vitamin D  (CALCIUM 600+D PO) Take 1 tablet by mouth 2 (two) times daily.    EPINEPHrine 0.3 mg/0.3 mL IJ SOAJ injection Inject 0.3 mg into the skin once. as needed.   estradiol (ESTRACE) 0.1 MG/GM vaginal cream Place 1 Applicatorful vaginally 3 (three) times a week.   levocetirizine (XYZAL) 5 MG tablet Take 1 tablet by mouth daily.   levothyroxine (SYNTHROID) 50 MCG tablet TAKE 1 TABLET EVERY DAY ON EMPTY STOMACHWITH A GLASS OF WATER AT LEAST 30-60 MINBEFORE BREAKFAST   Melatonin 10 MG TABS Take 1 tablet by mouth at bedtime.   Omega-3 Fatty Acids (FISH OIL) 1200 MG CAPS Take 1 capsule by mouth daily.    omeprazole (PRILOSEC) 20 MG capsule TAKE 2 CAPSULES BY MOUTH EVERY MORNING AND 1 CAPSULE IN THE EVENING   ramipril (ALTACE) 5 MG capsule Take 1 capsule (5 mg total) by mouth daily.   sertraline (ZOLOFT) 100 MG tablet Take 1 tablet (100 mg total) by mouth daily.   triamterene-hydrochlorothiazide (MAXZIDE-25) 37.5-25 MG tablet TAKE 1/2 TABLET EVERYDAY   Vibegron (GEMTESA) 75 MG TABS Take 1 tablet by mouth daily.   No facility-administered encounter medications on file as of 04/25/2022.    Allergies (verified) Atorvastatin, Ezetimibe, Lisinopril, Molds & smuts, Penicillamine, Penicillins, Pneumococcal vaccine, Pneumococcal vaccine polyvalent, Pollen extract, Rosuvastatin, and Simvastatin   History: Past Medical History:  Diagnosis Date   Allergic rhinitis    gets  allergy shot from Dr. Carlis Abbott   Anxiety    Arthritis    Diabetes mellitus without complication ([place])    diet controlled   GERD (gastroesophageal reflux disease)    History of shingles    at age 61- mild   Hx of colonic polyp    Hyperglycemia    Hyperlipemia    Very high chol intol of statins-does not want to check it   Hypertension    Panic disorder    Tremor    from anx   Urinary incontinence    mixed   Voice disorder    Past Surgical History:  Procedure Laterality Date   ABDOMINAL HYSTERECTOMY     BLADDER SUSPENSION      COLONOSCOPY WITH PROPOFOL N/A 12/24/2016   Procedure: COLONOSCOPY WITH PROPOFOL;  Surgeon: Manya Silvas, MD;  Location: [place];  Service: Endoscopy;  Laterality: N/A;   COLONOSCOPY WITH PROPOFOL N/A 04/17/2021   Procedure: COLONOSCOPY WITH PROPOFOL;  Surgeon: Lesly Rubenstein, MD;  Location: [facility] ENDOSCOPY;  Service: Endoscopy;  Laterality: N/A;   ESOPHAGOGASTRODUODENOSCOPY (EGD) WITH PROPOFOL N/A 12/24/2016   Procedure: ESOPHAGOGASTRODUODENOSCOPY (EGD) WITH PROPOFOL;  Surgeon: Manya Silvas, MD;  Location: [facility] ENDOSCOPY;  Service: Endoscopy;  Laterality: N/A;   EYE SURGERY Bilateral 2000   eye lens replacement   HAND SURGERY Left 2016   left long trigger finger release   JOINT REPLACEMENT     knee replacement - march 15   KNEE ARTHROSCOPY Left 04/26/2016   Procedure: ARTHROSCOPY KNEE;  Surgeon: Hessie Knows, MD;  Location: [facility] ORS;  Service: Orthopedics;  Laterality: Left;   MEDIAL PARTIAL KNEE REPLACEMENT Left 2016   NASAL SINUS SURGERY     PARTIAL HYSTERECTOMY     with fibroid   RECTOCELE REPAIR     SYNOVECTOMY Left 04/26/2016   Procedure: PARTIAL SYNOVECTOMY;  Surgeon: Hessie Knows, MD;  Location: [facility] ORS;  Service: Orthopedics;  Laterality: Left;   TONSILLECTOMY     Family History  Problem Relation Age of Onset   Diabetes Mother    Hyperlipidemia Mother    Hypertension Mother    Osteoarthritis Mother    Cancer Father        Colon   Leukemia Sister    Syncope episode Daughter    Breast cancer Neg Hx    Social History   Socioeconomic History   Marital status: Married    Spouse name: Not on file   Number of children: 2   Years of education: Not on file   Highest education level: Not on file  Occupational History   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  Tobacco Use   Smoking status: Never    Passive exposure: Never   Smokeless tobacco: Never  Vaping Use   Vaping Use: Never used  Substance and Sexual Activity   Alcohol  use: Yes    Comment: rare   Drug use: No   Sexual activity: Not Currently    Birth control/protection: None  Other Topics Concern   Not on file  Social History Narrative   Not on file   Social Determinants of Health   Financial Resource Strain: Low Risk  (04/25/2022)   Overall Financial Resource Strain (CARDIA)    Difficulty of Paying Living Expenses: Not hard at all  Food Insecurity: No Food Insecurity (04/25/2022)   Hunger Vital Sign    Worried About Running Out of Food in the Last Year: Never true    Ran  Out of Food in the Last Year: Never true  Transportation Needs: No Transportation Needs (04/25/2022)   PRAPARE - Hydrologist (Medical): No    Lack of Transportation (Non-Medical): No  Physical Activity: Insufficiently Active (04/25/2022)   Exercise Vital Sign    Days of Exercise per Week: 4 days    Minutes of Exercise per Session: 20 min  Stress: No Stress Concern Present (04/25/2022)   [place]    Feeling of Stress : Not at all  Social Connections: Moderately Integrated (04/25/2022)   Social Connection and Isolation Panel [NHANES]    Frequency of Communication with Friends and Family: More than three times a week    Frequency of Social Gatherings with Friends and Family: Once a week    Attends Religious Services: Never    Marine scientist or Organizations: Yes    Attends Music therapist: 1 to 4 times per year    Marital Status: Married    Tobacco Counseling Counseling given: Not Answered   Clinical Intake:  Pre-visit preparation completed: Yes  Pain : No/denies pain     Diabetes: No  How often do you need to have someone help you when you read instructions, pamphlets, or other written materials from your doctor or pharmacy?: 1 - Never  Diabetic?  no  Interpreter Needed?: No  Information entered by :: Leroy Kennedy LPN   Activities of Daily  Living    04/25/2022    9:14 AM  In your present state of health, do you have any difficulty performing the following activities:  Hearing? 0  Vision? 0  Difficulty concentrating or making decisions? 0  Walking or climbing stairs? 1  Dressing or bathing? 0  Doing errands, shopping? 0  Preparing Food and eating ? N  Using the Toilet? N  In the past six months, have you accidently leaked urine? N  Do you have problems with loss of bowel control? N  Managing your Medications? N  Managing your Finances? N  Housekeeping or managing your Housekeeping? N    Patient Care Team: Tower, Wynelle Fanny, MD as PCP - General Carloyn Manner, MD as Referring Physician (Otolaryngology) Hessie Knows, MD as Consulting Physician (Orthopedic Surgery) Rainey Pines, MD as Referring Physician (Psychiatry) Manya Silvas, MD (Inactive) as Consulting Physician (Gastroenterology) Eula Flax, OD as Referring Physician (Optometry)  Indicate any recent Medical Services you may have received from other than [facility] providers in the past year (date may be approximate).     Assessment:   This is a routine wellness examination for [name].  Hearing/Vision screen Hearing Screening - Comments:: No trouble hearing Vision Screening - Comments:: Dr. Theodosia Paling vision Up to date  Dietary issues and exercise activities discussed: Current Exercise Habits: Home exercise routine, Type of exercise: strength training/weights, Time (Minutes): 20, Frequency (Times/Week): 4, Weekly Exercise (Minutes/Week): 80, Intensity: Mild   Goals Addressed             This Visit's Progress    Weight (lb) < 200 lb (90.7 kg)         Depression Screen    04/25/2022    9:07 AM 04/23/2022   10:39 AM 04/21/2021   11:04 AM 04/13/2020    1:15 PM 06/30/2019   11:02 AM 04/03/2019    3:09 PM 03/28/2018   10:24 AM  PHQ 2/9 Scores  PHQ - 2 Score 0 0 0 0 1 0  0  PHQ- 9 Score   0 0 5 0 0    Fall Risk    04/25/2022    9:05 AM 04/23/2022    10:38 AM 04/21/2021   11:03 AM 04/13/2020    1:15 PM 04/03/2019    3:09 PM  Fall Risk   Falls in the past year? 1 1 0 0 0  Number falls in past yr: 0 0 0 0   Injury with Fall? 0 1 0 0   Risk for fall due to :  Impaired vision  Medication side effect Medication side effect  Follow up Falls evaluation completed;Education provided;Falls prevention discussed Falls evaluation completed  Falls evaluation completed;Falls prevention discussed Falls evaluation completed;Falls prevention discussed    FALL RISK PREVENTION PERTAINING TO THE HOME:  Any stairs in or around the home? No  If so, are there any without handrails? No  Home free of loose throw rugs in walkways, pet beds, electrical cords, etc? Yes  Adequate lighting in your home to reduce risk of falls? Yes   ASSISTIVE DEVICES UTILIZED TO PREVENT FALLS:  Life alert? No  Use of a cane, walker or w/c? No  Grab bars in the bathroom? Yes  Shower chair or bench in shower? Yes  Elevated toilet seat or a handicapped toilet? Yes   TIMED UP AND GO:  Was the test performed? No .    Cognitive Function:    04/13/2020    1:17 PM 04/03/2019    3:12 PM 03/28/2018   10:24 AM 03/07/2017    9:36 AM 03/07/2016   11:18 AM  MMSE - Mini Mental State Exam  Orientation to time '5 5 5 5 5  '$ Orientation to Place '5 5 5 5 5  '$ Registration '3 3 3 3 3  '$ Attention/ Calculation 5 5 0 0 0  Recall '3 3 3 3 3  '$ Language- name 2 objects  0 0 0 0  Language- repeat '1 1 1 1 1  '$ Language- follow 3 step command  0 '3 3 3  '$ Language- read & follow direction  0 0 0 0  Write a sentence  0 0 0 0  Copy design  0 0 0 0  Total score  '22 20 20 20        '$ 04/25/2022    9:09 AM  6CIT Screen  What Year? 0 points  What month? 0 points  What time? 0 points  Count back from 20 0 points  Months in reverse 0 points  Repeat phrase 0 points  Total Score 0 points    Immunizations Immunization History  Administered Date(s) Administered   H1N1 10/04/2008   Hepatitis B  01/16/1992, 02/16/1992, 08/17/1992   Influenza Split 07/18/2011, 06/04/2012   Influenza Whole 06/17/2008, 07/19/2009, 07/11/2010   Influenza, High Dose Seasonal PF 06/05/2017, 06/25/2019   Influenza,inj,Quad PF,6+ Mos 06/02/2013, 06/13/2016, 06/30/2018   Influenza,trivalent, recombinat, inj, PF 06/09/2015   Influenza-Unspecified 06/10/2014, 06/30/2018, 06/25/2019   PFIZER(Purple Top)SARS-COV-2 Vaccination 10/27/2019, 11/17/2019   Pneumococcal Polysaccharide-23 09/17/2002   Td 10/04/2008   Zoster Recombinat (Shingrix) 05/07/2018, 09/18/2018   Zoster, Live 05/25/2009    TDAP status: Up to date  Flu Vaccine status: Due, Education has been provided regarding the importance of this vaccine. Advised may receive this vaccine at local pharmacy or Health Dept. Aware to provide a copy of the vaccination record if obtained from local pharmacy or Health Dept. Verbalized acceptance and understanding.  Pneumococcal vaccine status: Up to date  Covid-19 vaccine status: Information provided  on how to obtain vaccines.   Qualifies for Shingles Vaccine? No   Zostavax completed Yes   Shingrix Completed?: Yes  Screening Tests Health Maintenance  Topic Date Due   INFLUENZA VACCINE  04/17/2022   COVID-19 Vaccine (3 - Pfizer series) 05/11/2022 (Originally 01/12/2020)   Pneumonia Vaccine 67+ Years old (2 - PCV) 04/24/2023 (Originally 05/13/2011)   TETANUS/TDAP  04/13/2029 (Originally 10/04/2018)   MAMMOGRAM  07/10/2022   COLONOSCOPY (Pts 45-83yr Insurance coverage will need to be confirmed)  04/18/2031   DEXA SCAN  Completed   Hepatitis C Screening  Completed   Zoster Vaccines- Shingrix  Completed   HPV VACCINES  Aged Out    Health Maintenance  Health Maintenance Due  Topic Date Due   INFLUENZA VACCINE  04/17/2022    Colorectal cancer screening: No longer required.   Mammogram status: Completed  . Repeat every year  Bone Density status: Completed 2022. Results reflect: Bone density results:  OSTEOPENIA. Repeat every 2 years.  Lung Cancer Screening: (Low Dose CT Chest recommended if Age 10457-80years, 30 pack-year currently smoking OR have quit w/in 15years.) does not qualify.   Lung Cancer Screening Referral:   Additional Screening:  Hepatitis C Screening: does not qualify; Completed 2017  Vision Screening: Recommended annual ophthalmology exams for early detection of glaucoma and other disorders of the eye. Is the patient up to date with their annual eye exam?  Yes  Who is the provider or what is the name of the office in which the patient attends annual eye exams?  [facility] If pt is not established with a provider, would they like to be referred to a provider to establish care? No .   Dental Screening: Recommended annual dental exams for proper oral hygiene  Community Resource Referral / Chronic Care Management: CRR required this visit?  No   CCM required this visit?  No      Plan:     I have personally reviewed and noted the following in the patient's chart:   Medical and social history Use of alcohol, tobacco or illicit drugs  Current medications and supplements including opioid prescriptions.  Functional ability and status Nutritional status Physical activity Advanced directives List of other physicians Hospitalizations, surgeries, and ER visits in previous 12 months Vitals Screenings to include cognitive, depression, and falls Referrals and appointments  In addition, I have reviewed and discussed with patient certain preventive protocols, quality metrics, and best practice recommendations. A written personalized care plan for preventive services as well as general preventive health recommendations were provided to patient.     JLeroy Kennedy LPN   80/04/1447  Nurse Notes:

## 2022-04-25 NOTE — Patient Instructions (Signed)
[name] , Thank you for taking time to come for your Medicare Wellness Visit. I appreciate your ongoing commitment to your health goals. Please review the following plan we discussed and let me know if I can assist you in the future.   Screening recommendations/referrals: Colonoscopy: no longer required Mammogram: up to date Bone Density: up to date Recommended yearly ophthalmology/optometry visit for glaucoma screening and checkup Recommended yearly dental visit for hygiene and checkup  Vaccinations: Influenza vaccine: up to date Pneumococcal vaccine: up to date Tdap vaccine: up to date Shingles vaccine: up to date    Preventive Care 67 Years and Older, Female Preventive care refers to lifestyle choices and visits with your health care provider that can promote health and wellness. What does preventive care include? A yearly physical exam. This is also called an annual well check. Dental exams once or twice a year. Routine eye exams. Ask your health care provider how often you should have your eyes checked. Personal lifestyle choices, including: Daily care of your teeth and gums. Regular physical activity. Eating a healthy diet. Avoiding tobacco and drug use. Limiting alcohol use. Practicing safe sex. Taking low-dose aspirin every day. Taking vitamin and mineral supplements as recommended by your health care provider. What happens during an annual well check? The services and screenings done by your health care provider during your annual well check will depend on your age, overall health, lifestyle risk factors, and family history of disease. Counseling  Your health care provider may ask you questions about your: Alcohol use. Tobacco use. Drug use. Emotional well-being. Home and relationship well-being. Sexual activity. Eating habits. History of falls. Memory and ability to understand (cognition). Work and work Statistician. Reproductive health. Screening  You may  have the following tests or measurements: Height, weight, and BMI. Blood pressure. Lipid and cholesterol levels. These may be checked every 5 years, or more frequently if you are over 57 years old. Skin check. Lung cancer screening. You may have this screening every year starting at age 22 if you have a 30-pack-year history of smoking and currently smoke or have quit within the past 15 years. Fecal occult blood test (FOBT) of the stool. You may have this test every year starting at age 3. Flexible sigmoidoscopy or colonoscopy. You may have a sigmoidoscopy every 5 years or a colonoscopy every 10 years starting at age 70. Hepatitis C blood test. Hepatitis B blood test. Sexually transmitted disease (STD) testing. Diabetes screening. This is done by checking your blood sugar (glucose) after you have not eaten for a while (fasting). You may have this done every 1-3 years. Bone density scan. This is done to screen for osteoporosis. You may have this done starting at age 35. Mammogram. This may be done every 1-2 years. Talk to your health care provider about how often you should have regular mammograms. Talk with your health care provider about your test results, treatment options, and if necessary, the need for more tests. Vaccines  Your health care provider may recommend certain vaccines, such as: Influenza vaccine. This is recommended every year. Tetanus, diphtheria, and acellular pertussis (Tdap, Td) vaccine. You may need a Td booster every 10 years. Zoster vaccine. You may need this after age 25. Pneumococcal 13-valent conjugate (PCV13) vaccine. One dose is recommended after age 42. Pneumococcal polysaccharide (PPSV23) vaccine. One dose is recommended after age 48. Talk to your health care provider about which screenings and vaccines you need and how often you need them. This information is  not intended to replace advice given to you by your health care provider. Make sure you discuss any  questions you have with your health care provider. Document Released: 09/30/2015 Document Revised: 05/23/2016 Document Reviewed: 07/05/2015 Elsevier Interactive Patient Education  2017 [place] Prevention in the Home Falls can cause injuries. They can happen to people of all ages. There are many things you can do to make your home safe and to help prevent falls. What can I do on the outside of my home? Regularly fix the edges of walkways and driveways and fix any cracks. Remove anything that might make you trip as you walk through a door, such as a raised step or threshold. Trim any bushes or trees on the path to your home. Use bright outdoor lighting. Clear any walking paths of anything that might make someone trip, such as rocks or tools. Regularly check to see if handrails are loose or broken. Make sure that both sides of any steps have handrails. Any raised decks and porches should have guardrails on the edges. Have any leaves, snow, or ice cleared regularly. Use sand or salt on walking paths during winter. Clean up any spills in your garage right away. This includes oil or grease spills. What can I do in the bathroom? Use night lights. Install grab bars by the toilet and in the tub and shower. Do not use towel bars as grab bars. Use non-skid mats or decals in the tub or shower. If you need to sit down in the shower, use a plastic, non-slip stool. Keep the floor dry. Clean up any water that spills on the floor as soon as it happens. Remove soap buildup in the tub or shower regularly. Attach bath mats securely with double-sided non-slip rug tape. Do not have throw rugs and other things on the floor that can make you trip. What can I do in the bedroom? Use night lights. Make sure that you have a light by your bed that is easy to reach. Do not use any sheets or blankets that are too big for your bed. They should not hang down onto the floor. Have a firm chair that has side  arms. You can use this for support while you get dressed. Do not have throw rugs and other things on the floor that can make you trip. What can I do in the kitchen? Clean up any spills right away. Avoid walking on wet floors. Keep items that you use a lot in easy-to-reach places. If you need to reach something above you, use a strong step stool that has a grab bar. Keep electrical cords out of the way. Do not use floor polish or wax that makes floors slippery. If you must use wax, use non-skid floor wax. Do not have throw rugs and other things on the floor that can make you trip. What can I do with my stairs? Do not leave any items on the stairs. Make sure that there are handrails on both sides of the stairs and use them. Fix handrails that are broken or loose. Make sure that handrails are as long as the stairways. Check any carpeting to make sure that it is firmly attached to the stairs. Fix any carpet that is loose or worn. Avoid having throw rugs at the top or bottom of the stairs. If you do have throw rugs, attach them to the floor with carpet tape. Make sure that you have a light switch at the top of the  stairs and the bottom of the stairs. If you do not have them, ask someone to add them for you. What else can I do to help prevent falls? Wear shoes that: Do not have high heels. Have rubber bottoms. Are comfortable and fit you well. Are closed at the toe. Do not wear sandals. If you use a stepladder: Make sure that it is fully opened. Do not climb a closed stepladder. Make sure that both sides of the stepladder are locked into place. Ask someone to hold it for you, if possible. Clearly mark and make sure that you can see: Any grab bars or handrails. First and last steps. Where the edge of each step is. Use tools that help you move around (mobility aids) if they are needed. These include: Canes. Walkers. Scooters. Crutches. Turn on the lights when you go into a dark area.  Replace any light bulbs as soon as they burn out. Set up your furniture so you have a clear path. Avoid moving your furniture around. If any of your floors are uneven, fix them. If there are any pets around you, be aware of where they are. Review your medicines with your doctor. Some medicines can make you feel dizzy. This can increase your chance of falling. Ask your doctor what other things that you can do to help prevent falls. This information is not intended to replace advice given to you by your health care provider. Make sure you discuss any questions you have with your health care provider. Document Released: 06/30/2009 Document Revised: 02/09/2016 Document Reviewed: 10/08/2014 Elsevier Interactive Patient Education  2017 [employer].

## 2022-04-30 ENCOUNTER — Other Ambulatory Visit: Payer: Self-pay | Admitting: Family Medicine

## 2022-05-29 ENCOUNTER — Other Ambulatory Visit: Payer: Self-pay | Admitting: Family Medicine

## 2022-06-12 ENCOUNTER — Other Ambulatory Visit: Payer: Self-pay | Admitting: Family Medicine

## 2022-06-12 ENCOUNTER — Other Ambulatory Visit (HOSPITAL_BASED_OUTPATIENT_CLINIC_OR_DEPARTMENT_OTHER): Payer: Self-pay | Admitting: Family Medicine

## 2022-06-12 DIAGNOSIS — Z1231 Encounter for screening mammogram for malignant neoplasm of breast: Secondary | ICD-10-CM

## 2022-06-25 DIAGNOSIS — J301 Allergic rhinitis due to pollen: Secondary | ICD-10-CM | POA: Diagnosis not present

## 2022-06-27 ENCOUNTER — Other Ambulatory Visit: Payer: Self-pay | Admitting: Family Medicine

## 2022-06-28 NOTE — Telephone Encounter (Signed)
Name of Medication: Xanax Name of Pharmacy: [facility] or Written Date and Quantity: 04/02/22 #90 tab/ 0 refills Last Office Visit and Type: CPE on 04/23/22 Next Office Visit and Type: f/u on 08/23/22

## 2022-07-02 DIAGNOSIS — J301 Allergic rhinitis due to pollen: Secondary | ICD-10-CM | POA: Diagnosis not present

## 2022-07-12 ENCOUNTER — Ambulatory Visit
Admission: RE | Admit: 2022-07-12 | Discharge: 2022-07-12 | Disposition: A | Discharge status: Home / Self Care | Payer: Medicare PPO | Source: Ambulatory Visit | Attending: Family Medicine | Admitting: Family Medicine

## 2022-07-12 DIAGNOSIS — Z1231 Encounter for screening mammogram for malignant neoplasm of breast: Secondary | ICD-10-CM | POA: Diagnosis not present

## 2022-07-16 ENCOUNTER — Other Ambulatory Visit: Payer: Self-pay | Admitting: Family Medicine

## 2022-07-16 DIAGNOSIS — R928 Other abnormal and inconclusive findings on diagnostic imaging of breast: Secondary | ICD-10-CM

## 2022-07-16 DIAGNOSIS — R921 Mammographic calcification found on diagnostic imaging of breast: Secondary | ICD-10-CM

## 2022-07-24 DIAGNOSIS — E119 Type 2 diabetes mellitus without complications: Secondary | ICD-10-CM | POA: Diagnosis not present

## 2022-07-31 ENCOUNTER — Ambulatory Visit
Admission: RE | Admit: 2022-07-31 | Discharge: 2022-07-31 | Disposition: A | Discharge status: Home / Self Care | Payer: Medicare PPO | Source: Ambulatory Visit | Attending: Family Medicine | Admitting: Family Medicine

## 2022-07-31 ENCOUNTER — Other Ambulatory Visit: Payer: Self-pay | Admitting: Family Medicine

## 2022-07-31 DIAGNOSIS — R928 Other abnormal and inconclusive findings on diagnostic imaging of breast: Secondary | ICD-10-CM | POA: Insufficient documentation

## 2022-07-31 DIAGNOSIS — R921 Mammographic calcification found on diagnostic imaging of breast: Secondary | ICD-10-CM | POA: Insufficient documentation

## 2022-07-31 DIAGNOSIS — R922 Inconclusive mammogram: Secondary | ICD-10-CM | POA: Diagnosis not present

## 2022-08-21 ENCOUNTER — Ambulatory Visit
Admission: RE | Admit: 2022-08-21 | Discharge: 2022-08-21 | Disposition: A | Discharge status: Home / Self Care | Payer: Medicare PPO | Source: Ambulatory Visit | Attending: Family Medicine | Admitting: Family Medicine

## 2022-08-21 DIAGNOSIS — R928 Other abnormal and inconclusive findings on diagnostic imaging of breast: Secondary | ICD-10-CM | POA: Insufficient documentation

## 2022-08-21 DIAGNOSIS — R921 Mammographic calcification found on diagnostic imaging of breast: Secondary | ICD-10-CM | POA: Diagnosis not present

## 2022-08-21 HISTORY — PX: BREAST BIOPSY: SHX20

## 2022-08-21 MED ORDER — LIDOCAINE-EPINEPHRINE 1 %-1:100000 IJ SOLN
20.0000 mL | Freq: Once | INTRAMUSCULAR | Status: AC
  Administered 2022-08-21: 20 mL
  Filled 2022-08-21: qty 20

## 2022-08-21 MED ORDER — LIDOCAINE HCL (PF) 1 % IJ SOLN
5.0000 mL | Freq: Once | INTRAMUSCULAR | Status: AC
  Administered 2022-08-21: 5 mL
  Filled 2022-08-21: qty 5

## 2022-08-22 ENCOUNTER — Other Ambulatory Visit: Payer: Self-pay | Admitting: Family Medicine

## 2022-08-22 LAB — SURGICAL PATHOLOGY

## 2022-08-23 ENCOUNTER — Ambulatory Visit: Payer: Medicare PPO | Admitting: Family Medicine

## 2022-08-23 ENCOUNTER — Encounter: Payer: Self-pay | Admitting: Family Medicine

## 2022-08-23 VITALS — BP 126/68 | HR 59 | Temp 97.1°F | Ht 63.75 in | Wt 173.5 lb

## 2022-08-23 DIAGNOSIS — I10 Essential (primary) hypertension: Secondary | ICD-10-CM | POA: Diagnosis not present

## 2022-08-23 DIAGNOSIS — M5136 Other intervertebral disc degeneration, lumbar region: Secondary | ICD-10-CM

## 2022-08-23 DIAGNOSIS — R7303 Prediabetes: Secondary | ICD-10-CM | POA: Diagnosis not present

## 2022-08-23 LAB — POCT GLYCOSYLATED HEMOGLOBIN (HGB A1C): Hemoglobin A1C: 6.2 % — AB (ref 4.0–5.6)

## 2022-08-23 NOTE — Assessment & Plan Note (Signed)
bp in fair control at this time  BP Readings from Last 1 Encounters:  08/23/22 126/68   No changes needed Most recent labs reviewed  Disc lifstyle change with low sodium diet and exercise  Doing better with lower dose of ace, no more dizziness Plan to continue Altace 5 mg daily  Triam/hct 37.5-25 mg daily

## 2022-08-23 NOTE — Assessment & Plan Note (Signed)
Lab Results  Component Value Date   HGBA1C 6.2 (A) 08/23/2022   This is improved with diet change Commended disc imp of low glycemic diet and wt loss to prevent DM2   Enc her to keep trying new types of exercise to see what she can tolerate with her back  ? Perhaps upper body strength training

## 2022-08-23 NOTE — Patient Instructions (Addendum)
Use weights and exercise bands to do some strength training   Look into chair yoga programs   Your A1c is back into the prediabetes range This is reassuring  Keep working on diet and weight loss

## 2022-08-23 NOTE — Progress Notes (Signed)
Subjective:    Patient ID: [name], female    DOB: 1946-06-01, 76 y.o.   MRN: [id number]  HPI Pt presents for f/u of prediabetes  Wt Readings from Last 3 Encounters:  08/23/22 173 lb 8 oz (78.7 kg)  04/23/22 172 lb (78 kg)  03/15/22 175 lb (79.4 kg)   30.02 kg/m  Had a breast bx and her path was benign  Very relieved  Site is healing ok   Otherwise doing ok     HTN bp is stable today  No cp or palpitations or headaches or edema  No side effects to medicines  BP Readings from Last 3 Encounters:  08/23/22 126/68  04/23/22 108/66  03/15/22 110/60    Altace 5 mg daily  (no dizziness on lower dose)  Triam/hct 37.5-25 mg daily   Lab Results  Component Value Date   CREATININE 1.10 04/18/2022   BUN 26 (H) 04/18/2022   NA 138 04/18/2022   K 4.6 04/18/2022   CL 101 04/18/2022   CO2 26 04/18/2022   GFR 49.01    Prediabetes Last time A1c was up Lab Results  Component Value Date   HGBA1C 6.7 (H) 04/18/2022   Has been trying to eat healthy   6.2 today  Better!   Cut back on her carbs - snacks and white potatoes  Not a lot of sweets to start with   Exercise- does her chronic PT exercises for her back (lumbar deg disc dz)   Using the bike flares her back  Cannot walk very long due to knee pain after knee replacement  Water exercise flares her  Back problem prevents her from doing things   Had flu and RSV shots    Patient Active Problem List   Diagnosis Date Noted   Left otitis media 03/15/2022   Lumbar degenerative disc disease 11/07/2021   Skin lesion of hand 11/07/2021   Encounter for screening mammogram for breast cancer 04/21/2021   Osteopenia 02/13/2016   Routine general medical examination at a health care facility 01/23/2016   Estrogen deficiency 01/23/2016   Hypothyroid 01/23/2016   Mild depression 02/21/2015   H/O diabetes mellitus 01/04/2015   H/O arthritis 01/04/2015   History of hay fever 01/04/2015   Encounter for Medicare  annual wellness exam 01/14/2013   Obesity (BMI 30-39.9) 01/15/2012   MENOPAUSAL SYNDROME 10/04/2008   Prediabetes 12/02/2007   TREMOR 12/02/2007   Hyperlipidemia 01/28/2007   Generalized anxiety disorder 01/28/2007   Essential hypertension 01/28/2007   GERD 01/28/2007   URINARY INCONTINENCE 01/28/2007   COLONIC POLYPS, HX OF 01/28/2007   HERPES ZOSTER 01/20/2007   DYSPHONIA 01/20/2007   COUGH, CHRONIC 01/20/2007   ALLERGY 01/20/2007   Past Medical History:  Diagnosis Date   Allergic rhinitis    gets allergy shot from Dr. Carlis Abbott   Anxiety    Arthritis    Diabetes mellitus without complication ([place])    diet controlled   GERD (gastroesophageal reflux disease)    History of shingles    at age 31- mild   Hx of colonic polyp    Hyperglycemia    Hyperlipemia    Very high chol intol of statins-does not want to check it   Hypertension    Panic disorder    Tremor    from anx   Urinary incontinence    mixed   Voice disorder    Past Surgical History:  Procedure Laterality Date   ABDOMINAL HYSTERECTOMY     BLADDER  SUSPENSION     BREAST BIOPSY Left 08/21/2022   stereo bx, calcs, "COIL" clip-path pending   BREAST BIOPSY Left 08/21/2022   MM LT BREAST BX W LOC DEV 1ST LESION IMAGE BX SPEC STEREO GUIDE 08/21/2022 [facility]   COLONOSCOPY WITH PROPOFOL N/A 12/24/2016   Procedure: COLONOSCOPY WITH PROPOFOL;  Surgeon: Manya Silvas, MD;  Location: [facility];  Service: Endoscopy;  Laterality: N/A;   COLONOSCOPY WITH PROPOFOL N/A 04/17/2021   Procedure: COLONOSCOPY WITH PROPOFOL;  Surgeon: Lesly Rubenstein, MD;  Location: [facility];  Service: Endoscopy;  Laterality: N/A;   ESOPHAGOGASTRODUODENOSCOPY (EGD) WITH PROPOFOL N/A 12/24/2016   Procedure: ESOPHAGOGASTRODUODENOSCOPY (EGD) WITH PROPOFOL;  Surgeon: Manya Silvas, MD;  Location: [facility];  Service: Endoscopy;  Laterality: N/A;   EYE SURGERY Bilateral 2000   eye lens replacement   HAND SURGERY Left 2016    left long trigger finger release   JOINT REPLACEMENT     knee replacement - march 15   KNEE ARTHROSCOPY Left 04/26/2016   Procedure: ARTHROSCOPY KNEE;  Surgeon: Hessie Knows, MD;  Location: [facility];  Service: Orthopedics;  Laterality: Left;   MEDIAL PARTIAL KNEE REPLACEMENT Left 2016   NASAL SINUS SURGERY     PARTIAL HYSTERECTOMY     with fibroid   RECTOCELE REPAIR     SYNOVECTOMY Left 04/26/2016   Procedure: PARTIAL SYNOVECTOMY;  Surgeon: Hessie Knows, MD;  Location: [facility];  Service: Orthopedics;  Laterality: Left;   TONSILLECTOMY     Social History   Tobacco Use   Smoking status: Never    Passive exposure: Never   Smokeless tobacco: Never  Vaping Use   Vaping Use: Never used  Substance Use Topics   Alcohol use: Yes    Comment: rare   Drug use: No   Family History  Problem Relation Age of Onset   Diabetes Mother    Hyperlipidemia Mother    Hypertension Mother    Osteoarthritis Mother    Cancer Father        Colon   Leukemia Sister    Syncope episode Daughter    Breast cancer Neg Hx    Allergies  Allergen Reactions   Atorvastatin     REACTION: shoulder pain   Ezetimibe     REACTION: ?   Lisinopril     REACTION: muscle pain   Molds & Smuts    Penicillamine    Penicillins     REACTION: reaction not known Has patient had a PCN reaction causing immediate rash, facial/tongue/throat swelling, SOB or lightheadedness with hypotension: unknown Has patient had a PCN reaction causing severe rash involving mucus membranes or skin necrosis: unknown Has patient had a PCN reaction that required hospitalization unsure Has patient had a PCN reaction occurring within the last 10 years: no If all of the above answers are "NO", then may proceed with Cephalosporin use.   Pneumococcal Vaccine Swelling    REACTION: severe local reaction   Pneumococcal Vaccine Polyvalent     REACTION: severe local reaction   Pollen Extract    Rosuvastatin     REACTION: leg pain    Simvastatin     REACTION: muscle pain   Current Outpatient Medications on File Prior to Visit  Medication Sig Dispense Refill   ALPRAZolam (XANAX) 0.25 MG tablet TAKE ONE TABLET AT BEDTIME AS NEEDED FORANXIETY 90 tablet 0   Azelastine HCl (ASTEPRO) 0.15 % SOLN Place 1 spray into the nose at bedtime.     Calcium Carbonate-Vitamin D (  CALCIUM 600+D PO) Take 1 tablet by mouth 2 (two) times daily.      EPINEPHrine 0.3 mg/0.3 mL IJ SOAJ injection Inject 0.3 mg into the skin once. as needed.     estradiol (ESTRACE) 0.1 MG/GM vaginal cream Place 1 Applicatorful vaginally 3 (three) times a week.     levocetirizine (XYZAL) 5 MG tablet Take 1 tablet by mouth daily.     levothyroxine (SYNTHROID) 50 MCG tablet TAKE 1 TABLET EVERY DAY ON EMPTY STOMACHWITH A GLASS OF WATER AT LEAST 30-60 MINBEFORE BREAKFAST 90 tablet 2   Melatonin 10 MG TABS Take 1 tablet by mouth at bedtime.     Omega-3 Fatty Acids (FISH OIL) 1200 MG CAPS Take 1 capsule by mouth daily.      omeprazole (PRILOSEC) 20 MG capsule TAKE 2 CAPSULES BY MOUTH EVERY MORNING AND 1 CAPSULE IN THE EVENING 270 capsule 2   ramipril (ALTACE) 5 MG capsule Take 1 capsule (5 mg total) by mouth daily. 90 capsule 3   sertraline (ZOLOFT) 100 MG tablet Take 1 tablet (100 mg total) by mouth daily. 90 tablet 3   triamterene-hydrochlorothiazide (MAXZIDE-25) 37.5-25 MG tablet TAKE 1/2 TABLET EVERYDAY 45 tablet 2   Vibegron (GEMTESA) 75 MG TABS Take 1 tablet by mouth daily. 90 tablet 3   No current facility-administered medications on file prior to visit.      Review of Systems  Constitutional:  Negative for activity change, appetite change, fatigue, fever and unexpected weight change.  HENT:  Negative for congestion, ear pain, rhinorrhea, sinus pressure and sore throat.   Eyes:  Negative for pain, redness and visual disturbance.  Respiratory:  Negative for cough, shortness of breath and wheezing.   Cardiovascular:  Negative for chest pain and palpitations.   Gastrointestinal:  Negative for abdominal pain, blood in stool, constipation and diarrhea.  Endocrine: Negative for polydipsia and polyuria.  Genitourinary:  Negative for dysuria, frequency and urgency.  Musculoskeletal:  Positive for back pain. Negative for arthralgias and myalgias.  Skin:  Negative for pallor and rash.  Allergic/Immunologic: Negative for environmental allergies.  Neurological:  Negative for dizziness, syncope and headaches.  Hematological:  Negative for adenopathy. Does not bruise/bleed easily.  Psychiatric/Behavioral:  Negative for decreased concentration and dysphoric mood. The patient is not nervous/anxious.        Objective:   Physical Exam Constitutional:      General: She is not in acute distress.    Appearance: Normal appearance. She is well-developed. She is obese. She is not ill-appearing or diaphoretic.  HENT:     Head: Normocephalic and atraumatic.  Eyes:     Conjunctiva/sclera: Conjunctivae normal.     Pupils: Pupils are equal, round, and reactive to light.  Neck:     Thyroid: No thyromegaly.     Vascular: No carotid bruit or JVD.  Cardiovascular:     Rate and Rhythm: Normal rate and regular rhythm.     Heart sounds: Normal heart sounds.     No gallop.  Pulmonary:     Effort: Pulmonary effort is normal. No respiratory distress.     Breath sounds: Normal breath sounds. No wheezing or rales.  Abdominal:     General: There is no distension or abdominal bruit.     Palpations: Abdomen is soft.  Musculoskeletal:     Cervical back: Normal range of motion and neck supple.     Right lower leg: No edema.     Left lower leg: No edema.  Comments: Some loss of lordosis  Lymphadenopathy:     Cervical: No cervical adenopathy.  Skin:    General: Skin is warm and dry.     Coloration: Skin is not pale.     Findings: No rash.  Neurological:     Mental Status: She is alert.     Coordination: Coordination normal.     Deep Tendon Reflexes: Reflexes are  normal and symmetric. Reflexes normal.  Psychiatric:        Mood and Affect: Mood normal.           Assessment & Plan:   Problem List Items Addressed This Visit       Cardiovascular and Mediastinum   Essential hypertension    bp in fair control at this time  BP Readings from Last 1 Encounters:  08/23/22 126/68  No changes needed Most recent labs reviewed  Disc lifstyle change with low sodium diet and exercise  Doing better with lower dose of ace, no more dizziness Plan to continue Altace 5 mg daily  Triam/hct 37.5-25 mg daily        Musculoskeletal and Integument   Lumbar degenerative disc disease    Ongoing  This prevents exercise since any type of exercise seems to flare it  Enc to keep doing her PT exercises         Other   Prediabetes - Primary    Lab Results  Component Value Date   HGBA1C 6.2 (A) 08/23/2022  This is improved with diet change Commended disc imp of low glycemic diet and wt loss to prevent DM2   Enc her to keep trying new types of exercise to see what she can tolerate with her back  ? Perhaps upper body strength training      Relevant Orders   POCT glycosylated hemoglobin (Hb A1C) (Completed)

## 2022-08-23 NOTE — Assessment & Plan Note (Signed)
Ongoing  This prevents exercise since any type of exercise seems to flare it  Enc to keep doing her PT exercises

## 2022-08-27 ENCOUNTER — Other Ambulatory Visit: Payer: Self-pay | Admitting: Family

## 2022-09-28 DIAGNOSIS — J301 Allergic rhinitis due to pollen: Secondary | ICD-10-CM | POA: Diagnosis not present

## 2022-10-01 ENCOUNTER — Other Ambulatory Visit: Payer: Self-pay | Admitting: Family Medicine

## 2022-10-01 DIAGNOSIS — J301 Allergic rhinitis due to pollen: Secondary | ICD-10-CM | POA: Diagnosis not present

## 2022-10-01 MED ORDER — ALPRAZOLAM 0.25 MG PO TABS: ORAL_TABLET | ORAL | 0 refills | Status: DC

## 2022-10-01 NOTE — Telephone Encounter (Signed)
Prescription Request  10/01/2022  Is this a "Controlled Substance" medicine? No  LOV: 08/23/2022  What is the name of the medication or equipment?  ALPRAZolam (XANAX) 0.25 MG tablet   Have you contacted your pharmacy to request a refill? Yes   Which pharmacy would you like this sent to?  [facility] - [place], [place] - [address] [place] [address] Phone: [phone] Fax: [phone]    Patient notified that their request is being sent to the clinical staff for review and that they should receive a response within 2 business days.   Please advise at Mobile [phone] (mobile)

## 2022-10-01 NOTE — Telephone Encounter (Signed)
Name of Medication: Xanax Name of Pharmacy: [facility] or Written Date and Quantity: 06/28/22 #90 tab/ 0 refills Last Office Visit and Type: f/u on 08/23/22 Next Office Visit and Type: CPE on 04/26/23

## 2022-11-22 DIAGNOSIS — D2271 Melanocytic nevi of right lower limb, including hip: Secondary | ICD-10-CM | POA: Diagnosis not present

## 2022-11-22 DIAGNOSIS — D2272 Melanocytic nevi of left lower limb, including hip: Secondary | ICD-10-CM | POA: Diagnosis not present

## 2022-11-22 DIAGNOSIS — D225 Melanocytic nevi of trunk: Secondary | ICD-10-CM | POA: Diagnosis not present

## 2022-11-22 DIAGNOSIS — L821 Other seborrheic keratosis: Secondary | ICD-10-CM | POA: Diagnosis not present

## 2022-11-22 DIAGNOSIS — L280 Lichen simplex chronicus: Secondary | ICD-10-CM | POA: Diagnosis not present

## 2022-11-22 DIAGNOSIS — L578 Other skin changes due to chronic exposure to nonionizing radiation: Secondary | ICD-10-CM | POA: Diagnosis not present

## 2022-11-22 DIAGNOSIS — L658 Other specified nonscarring hair loss: Secondary | ICD-10-CM | POA: Diagnosis not present

## 2022-11-22 DIAGNOSIS — D2261 Melanocytic nevi of right upper limb, including shoulder: Secondary | ICD-10-CM | POA: Diagnosis not present

## 2022-11-22 DIAGNOSIS — D2262 Melanocytic nevi of left upper limb, including shoulder: Secondary | ICD-10-CM | POA: Diagnosis not present

## 2022-12-11 ENCOUNTER — Other Ambulatory Visit: Payer: Self-pay | Admitting: Family Medicine

## 2022-12-26 ENCOUNTER — Other Ambulatory Visit: Payer: Self-pay | Admitting: Family Medicine

## 2022-12-26 DIAGNOSIS — J301 Allergic rhinitis due to pollen: Secondary | ICD-10-CM | POA: Diagnosis not present

## 2022-12-27 NOTE — Telephone Encounter (Signed)
Name of Medication: Xanax Name of Pharmacy: [facility]  Last Fill or Written Date and Quantity: 10/01/22 #90 tab/ 0 refills Last Office Visit and Type: f/u on 08/23/22 Next Office Visit and Type: CPE on 04/26/23

## 2022-12-31 DIAGNOSIS — J301 Allergic rhinitis due to pollen: Secondary | ICD-10-CM | POA: Diagnosis not present

## 2023-01-29 ENCOUNTER — Other Ambulatory Visit: Payer: Self-pay | Admitting: Family Medicine

## 2023-02-02 DIAGNOSIS — M79672 Pain in left foot: Secondary | ICD-10-CM | POA: Diagnosis not present

## 2023-02-13 DIAGNOSIS — R69 Illness, unspecified: Secondary | ICD-10-CM | POA: Diagnosis not present

## 2023-02-13 DIAGNOSIS — S92324A Nondisplaced fracture of second metatarsal bone, right foot, initial encounter for closed fracture: Secondary | ICD-10-CM | POA: Diagnosis not present

## 2023-02-21 ENCOUNTER — Other Ambulatory Visit: Payer: Self-pay | Admitting: Family Medicine

## 2023-02-27 ENCOUNTER — Other Ambulatory Visit: Payer: Self-pay | Admitting: Family Medicine

## 2023-03-06 DIAGNOSIS — M84375A Stress fracture, left foot, initial encounter for fracture: Secondary | ICD-10-CM | POA: Diagnosis not present

## 2023-03-27 ENCOUNTER — Other Ambulatory Visit: Payer: Self-pay | Admitting: Family

## 2023-03-27 DIAGNOSIS — J301 Allergic rhinitis due to pollen: Secondary | ICD-10-CM | POA: Diagnosis not present

## 2023-03-27 DIAGNOSIS — L280 Lichen simplex chronicus: Secondary | ICD-10-CM | POA: Diagnosis not present

## 2023-03-27 DIAGNOSIS — X32XXXA Exposure to sunlight, initial encounter: Secondary | ICD-10-CM | POA: Diagnosis not present

## 2023-03-27 DIAGNOSIS — L570 Actinic keratosis: Secondary | ICD-10-CM | POA: Diagnosis not present

## 2023-03-27 DIAGNOSIS — R202 Paresthesia of skin: Secondary | ICD-10-CM | POA: Diagnosis not present

## 2023-03-29 ENCOUNTER — Other Ambulatory Visit: Payer: Self-pay | Admitting: Family Medicine

## 2023-03-29 NOTE — Telephone Encounter (Signed)
Name of Medication: Xanax Name of Pharmacy: [facility]  Last Fill or Written Date and Quantity: 12/27/22 #90 tab/ 0 refills Last Office Visit and Type: f/u on 08/23/22 Next Office Visit and Type: CPE on 04/26/23

## 2023-04-01 DIAGNOSIS — J301 Allergic rhinitis due to pollen: Secondary | ICD-10-CM | POA: Diagnosis not present

## 2023-04-05 DIAGNOSIS — M84375A Stress fracture, left foot, initial encounter for fracture: Secondary | ICD-10-CM | POA: Diagnosis not present

## 2023-04-16 ENCOUNTER — Encounter: Payer: Self-pay | Admitting: Family Medicine

## 2023-04-16 ENCOUNTER — Other Ambulatory Visit: Payer: Self-pay | Admitting: Family Medicine

## 2023-04-17 ENCOUNTER — Encounter (INDEPENDENT_AMBULATORY_CARE_PROVIDER_SITE_OTHER): Payer: Self-pay

## 2023-04-18 MED ORDER — GEMTESA 75 MG PO TABS: 1.0000 | ORAL_TABLET | Freq: Every day | ORAL | 1 refills | Status: DC

## 2023-04-22 ENCOUNTER — Telehealth: Payer: Self-pay | Admitting: Family Medicine

## 2023-04-22 DIAGNOSIS — Z79899 Other long term (current) drug therapy: Secondary | ICD-10-CM | POA: Insufficient documentation

## 2023-04-22 DIAGNOSIS — E039 Hypothyroidism, unspecified: Secondary | ICD-10-CM

## 2023-04-22 DIAGNOSIS — E7800 Pure hypercholesterolemia, unspecified: Secondary | ICD-10-CM

## 2023-04-22 DIAGNOSIS — I10 Essential (primary) hypertension: Secondary | ICD-10-CM

## 2023-04-22 DIAGNOSIS — R7303 Prediabetes: Secondary | ICD-10-CM

## 2023-04-22 NOTE — Telephone Encounter (Signed)
-----   Message from Lovena Neighbours sent at 04/08/2023  3:21 PM EDT ----- Regarding: Labs 8.6.24 Please put physical lab orders in future. Thank you, Denny Peon

## 2023-04-23 ENCOUNTER — Other Ambulatory Visit: Payer: Medicare PPO

## 2023-04-25 ENCOUNTER — Ambulatory Visit: Payer: Medicare PPO

## 2023-04-26 ENCOUNTER — Ambulatory Visit: Payer: Medicare PPO

## 2023-04-26 ENCOUNTER — Encounter: Payer: Medicare PPO | Admitting: Family Medicine

## 2023-04-26 ENCOUNTER — Other Ambulatory Visit: Payer: Self-pay | Admitting: Family Medicine

## 2023-04-30 ENCOUNTER — Ambulatory Visit: Payer: Medicare PPO

## 2023-05-07 DIAGNOSIS — H6982 Other specified disorders of Eustachian tube, left ear: Secondary | ICD-10-CM | POA: Diagnosis not present

## 2023-05-07 DIAGNOSIS — H6122 Impacted cerumen, left ear: Secondary | ICD-10-CM | POA: Diagnosis not present

## 2023-05-07 DIAGNOSIS — R42 Dizziness and giddiness: Secondary | ICD-10-CM | POA: Diagnosis not present

## 2023-05-09 ENCOUNTER — Ambulatory Visit (INDEPENDENT_AMBULATORY_CARE_PROVIDER_SITE_OTHER): Payer: Medicare PPO

## 2023-05-09 ENCOUNTER — Other Ambulatory Visit (INDEPENDENT_AMBULATORY_CARE_PROVIDER_SITE_OTHER): Payer: Medicare PPO

## 2023-05-09 VITALS — Ht 64.0 in | Wt 172.0 lb

## 2023-05-09 DIAGNOSIS — Z79899 Other long term (current) drug therapy: Secondary | ICD-10-CM

## 2023-05-09 DIAGNOSIS — E7800 Pure hypercholesterolemia, unspecified: Secondary | ICD-10-CM | POA: Diagnosis not present

## 2023-05-09 DIAGNOSIS — E039 Hypothyroidism, unspecified: Secondary | ICD-10-CM | POA: Diagnosis not present

## 2023-05-09 DIAGNOSIS — Z1231 Encounter for screening mammogram for malignant neoplasm of breast: Secondary | ICD-10-CM

## 2023-05-09 DIAGNOSIS — Z780 Asymptomatic menopausal state: Secondary | ICD-10-CM | POA: Diagnosis not present

## 2023-05-09 DIAGNOSIS — I10 Essential (primary) hypertension: Secondary | ICD-10-CM | POA: Diagnosis not present

## 2023-05-09 DIAGNOSIS — R7303 Prediabetes: Secondary | ICD-10-CM | POA: Diagnosis not present

## 2023-05-09 DIAGNOSIS — Z0000 Encounter for general adult medical examination without abnormal findings: Secondary | ICD-10-CM | POA: Diagnosis not present

## 2023-05-09 LAB — CBC WITH DIFFERENTIAL/PLATELET
Basophils Absolute: 0.1 10*3/uL (ref 0.0–0.1)
Basophils Relative: 0.7 % (ref 0.0–3.0)
Eosinophils Absolute: 0.1 10*3/uL (ref 0.0–0.7)
Eosinophils Relative: 1.6 % (ref 0.0–5.0)
HCT: 42.4 % (ref 36.0–46.0)
Hemoglobin: 14.1 g/dL (ref 12.0–15.0)
Lymphocytes Relative: 30.3 % (ref 12.0–46.0)
Lymphs Abs: 2.8 10*3/uL (ref 0.7–4.0)
MCHC: 33.4 g/dL (ref 30.0–36.0)
MCV: 85.2 fl (ref 78.0–100.0)
Monocytes Absolute: 0.9 10*3/uL (ref 0.1–1.0)
Monocytes Relative: 10.1 % (ref 3.0–12.0)
Neutro Abs: 5.4 10*3/uL (ref 1.4–7.7)
Neutrophils Relative %: 57.3 % (ref 43.0–77.0)
Platelets: 269 10*3/uL (ref 150.0–400.0)
RBC: 4.97 Mil/uL (ref 3.87–5.11)
RDW: 14.7 % (ref 11.5–15.5)
WBC: 9.3 10*3/uL (ref 4.0–10.5)

## 2023-05-09 LAB — COMPREHENSIVE METABOLIC PANEL
ALT: 18 U/L (ref 0–35)
AST: 18 U/L (ref 0–37)
Albumin: 4.3 g/dL (ref 3.5–5.2)
Alkaline Phosphatase: 74 U/L (ref 39–117)
BUN: 25 mg/dL — ABNORMAL HIGH (ref 6–23)
CO2: 27 meq/L (ref 19–32)
Calcium: 9.4 mg/dL (ref 8.4–10.5)
Chloride: 99 meq/L (ref 96–112)
Creatinine, Ser: 1.1 mg/dL (ref 0.40–1.20)
GFR: 48.65 mL/min — ABNORMAL LOW (ref >60.00)
Glucose, Bld: 128 mg/dL — ABNORMAL HIGH (ref 70–99)
Potassium: 3.9 meq/L (ref 3.5–5.1)
Sodium: 136 meq/L (ref 135–145)
Total Bilirubin: 0.7 mg/dL (ref 0.2–1.2)
Total Protein: 6.6 g/dL (ref 6.0–8.3)

## 2023-05-09 LAB — LIPID PANEL
Cholesterol: 370 mg/dL — ABNORMAL HIGH (ref 0–200)
HDL: 45 mg/dL (ref >39.00)
LDL Cholesterol: 294 mg/dL — ABNORMAL HIGH (ref 0–99)
NonHDL: 324.63
Total CHOL/HDL Ratio: 8
Triglycerides: 153 mg/dL — ABNORMAL HIGH (ref 0.0–149.0)
VLDL: 30.6 mg/dL (ref 0.0–40.0)

## 2023-05-09 LAB — HEMOGLOBIN A1C: Hgb A1c MFr Bld: 6.5 % (ref 4.6–6.5)

## 2023-05-09 NOTE — Progress Notes (Signed)
Subjective:   [name] is a 77 y.o. female who presents for Medicare Annual (Subsequent) preventive examination.  Visit Complete: Virtual  I connected with  [name] on 05/09/23 by a audio enabled telemedicine application and verified that I am speaking with the correct person using two identifiers.  Patient Location: Home  Provider Location: Office/Clinic  I discussed the limitations of evaluation and management by telemedicine. The patient expressed understanding and agreed to proceed.  Patient Medicare AWV questionnaire was completed by the patient on 05/09/23; I have confirmed that all information answered by patient is correct and no changes since this date.  Vital Signs: Because this visit was a virtual/telehealth visit, some criteria may be missing or patient reported. Any vitals not documented were not able to be obtained and vitals that have been documented are patient reported.    Review of Systems      Cardiac Risk Factors include: advanced age (>57men, >39 women);hypertension;dyslipidemia;obesity (BMI >30kg/m2);sedentary lifestyle     Objective:    Today's Vitals   05/09/23 1226  Weight: 172 lb (78 kg)  Height: 5\' 4"  (1.626 m)   Body mass index is 29.52 kg/m.     05/09/2023   12:34 PM 04/25/2022    9:04 AM 03/08/2022    3:42 PM 04/17/2021   12:39 PM 04/13/2020    1:14 PM 04/03/2019    3:07 PM 03/28/2018   10:24 AM  Advanced Directives  Does Patient Have a Medical Advance Directive? Yes Yes No Yes Yes Yes Yes  Type of Estate agent of [place];Living will Healthcare Power of [employer] of [place];Living will Healthcare Power of [place];Living will Healthcare Power of [place];Living will Healthcare Power of [place];Living will  Does patient want to make changes to medical advance directive?      No - Patient declined   Copy of Healthcare Power of Attorney in Chart? No - copy requested Yes - validated most recent copy  scanned in chart (See row information)   No - copy requested No - copy requested No - copy requested  Would patient like information on creating a medical advance directive?   No - Patient declined        Current Medications (verified) Outpatient Encounter Medications as of 05/09/2023  Medication Sig   ALPRAZolam (XANAX) 0.25 MG tablet TAKE ONE TABLET BY MOUTH AT BEDTIME AS NEEDED FOR ANXIETY   Azelastine HCl (ASTEPRO) 0.15 % SOLN Place 1 spray into the nose at bedtime.   Calcium Carbonate-Vitamin D (CALCIUM 600+D PO) Take 1 tablet by mouth 2 (two) times daily.    estradiol (ESTRACE) 0.1 MG/GM vaginal cream Place 1 Applicatorful vaginally 3 (three) times a week.   levocetirizine (XYZAL) 5 MG tablet Take 1 tablet by mouth daily.   levothyroxine (SYNTHROID) 50 MCG tablet TAKE 1 TABLET EVERY DAY ON EMPTY STOMACHWITH A GLASS OF WATER AT LEAST 30-60 MINBEFORE BREAKFAST   Melatonin 10 MG TABS Take 1 tablet by mouth at bedtime.   Omega-3 Fatty Acids (FISH OIL) 1200 MG CAPS Take 1 capsule by mouth daily.    omeprazole (PRILOSEC) 20 MG capsule TAKE 2 CAPSULES BY MOUTH EVERY MORNING AND 1 CAPSULE IN THE EVENING   ramipril (ALTACE) 5 MG capsule TAKE 1 CAPSULE BY MOUTH ONCE DAILY   sertraline (ZOLOFT) 100 MG tablet TAKE 1 TABLET BY MOUTH DAILY   triamterene-hydrochlorothiazide (MAXZIDE-25) 37.5-25 MG tablet TAKE 1/2 TABLET EVERYDAY   Vibegron (GEMTESA) 75 MG TABS Take 1 tablet (  75 mg total) by mouth daily.   EPINEPHrine 0.3 mg/0.3 mL IJ SOAJ injection Inject 0.3 mg into the skin once. as needed. (Patient not taking: Reported on 05/09/2023)   No facility-administered encounter medications on file as of 05/09/2023.    Allergies (verified) Atorvastatin, Ezetimibe, Lisinopril, Molds & smuts, Penicillamine, Penicillins, Pneumococcal vaccine, Pneumococcal vaccine polyvalent, Pollen extract, Rosuvastatin, and Simvastatin   History: Past Medical History:  Diagnosis Date   Allergic rhinitis    gets allergy  shot from Dr. Chestine Spore   Anxiety    Arthritis    Diabetes mellitus without complication (HCC)    diet controlled   GERD (gastroesophageal reflux disease)    History of shingles    at age 16- mild   Hx of colonic polyp    Hyperglycemia    Hyperlipemia    Very high chol intol of statins-does not want to check it   Hypertension    Panic disorder    Tremor    from anx   Urinary incontinence    mixed   Voice disorder    Past Surgical History:  Procedure Laterality Date   ABDOMINAL HYSTERECTOMY     BLADDER SUSPENSION     BREAST BIOPSY Left 08/21/2022   stereo bx, calcs, "COIL" clip-path pending   BREAST BIOPSY Left 08/21/2022   MM LT BREAST BX W LOC DEV 1ST LESION IMAGE BX SPEC STEREO GUIDE 08/21/2022 [facility]   COLONOSCOPY WITH PROPOFOL N/A 12/24/2016   Procedure: COLONOSCOPY WITH PROPOFOL;  Surgeon: Scot Jun, MD;  Location: [facility] ENDOSCOPY;  Service: Endoscopy;  Laterality: N/A;   COLONOSCOPY WITH PROPOFOL N/A 04/17/2021   Procedure: COLONOSCOPY WITH PROPOFOL;  Surgeon: Regis Bill, MD;  Location: [facility] ENDOSCOPY;  Service: Endoscopy;  Laterality: N/A;   ESOPHAGOGASTRODUODENOSCOPY (EGD) WITH PROPOFOL N/A 12/24/2016   Procedure: ESOPHAGOGASTRODUODENOSCOPY (EGD) WITH PROPOFOL;  Surgeon: Scot Jun, MD;  Location: [facility] ENDOSCOPY;  Service: Endoscopy;  Laterality: N/A;   EYE SURGERY Bilateral 2000   eye lens replacement   HAND SURGERY Left 2016   left long trigger finger release   JOINT REPLACEMENT     knee replacement - march 15   KNEE ARTHROSCOPY Left 04/26/2016   Procedure: ARTHROSCOPY KNEE;  Surgeon: Kennedy Bucker, MD;  Location: [facility] ORS;  Service: Orthopedics;  Laterality: Left;   MEDIAL PARTIAL KNEE REPLACEMENT Left 2016   NASAL SINUS SURGERY     PARTIAL HYSTERECTOMY     with fibroid   RECTOCELE REPAIR     SYNOVECTOMY Left 04/26/2016   Procedure: PARTIAL SYNOVECTOMY;  Surgeon: Kennedy Bucker, MD;  Location: [facility] ORS;  Service: Orthopedics;   Laterality: Left;   TONSILLECTOMY     Family History  Problem Relation Age of Onset   Diabetes Mother    Hyperlipidemia Mother    Hypertension Mother    Osteoarthritis Mother    Cancer Father        Colon   Leukemia Sister    Syncope episode Daughter    Breast cancer Neg Hx    Social History   Socioeconomic History   Marital status: Married    Spouse name: Not on file   Number of children: 2   Years of education: Not on file   Highest education level: Not on file  Occupational History   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  Tobacco Use   Smoking status: Never    Passive exposure: Never   Smokeless tobacco: Never  Advertising account planner  Vaping status: Never Used  Substance and Sexual Activity   Alcohol use: Yes    Comment: rare   Drug use: No   Sexual activity: Not Currently    Birth control/protection: None  Other Topics Concern   Not on file  Social History Narrative   Not on file   Social Determinants of Health   Financial Resource Strain: Low Risk  (05/09/2023)   Overall Financial Resource Strain (CARDIA)    Difficulty of Paying Living Expenses: Not hard at all  Food Insecurity: No Food Insecurity (05/09/2023)   Hunger Vital Sign    Worried About Running Out of Food in the Last Year: Never true    Ran Out of Food in the Last Year: Never true  Transportation Needs: No Transportation Needs (05/09/2023)   PRAPARE - Transportation    Lack of Transportation (Medical): No    Lack of Transportation (Non-Medical): No  Physical Activity: Insufficiently Active (05/09/2023)   Exercise Vital Sign    Days of Exercise per Week: 4 days    Minutes of Exercise per Session: 20 min  Stress: No Stress Concern Present (05/09/2023)   [employer] of Occupational Health - Occupational Stress Questionnaire    Feeling of Stress : Only a little  Social Connections: Moderately Isolated (05/09/2023)   Social Connection and Isolation Panel [NHANES]     Frequency of Communication with Friends and Family: Three times a week    Frequency of Social Gatherings with Friends and Family: Once a week    Attends Religious Services: Never    Database administrator or Organizations: No    Attends Engineer, structural: Never    Marital Status: Living with partner    Tobacco Counseling Counseling given: Not Answered   Clinical Intake:  Pre-visit preparation completed: Yes  Pain : No/denies pain     BMI - recorded: 29.52 Nutritional Status: BMI 25 -29 Overweight Nutritional Risks: None Diabetes: No  How often do you need to have someone help you when you read instructions, pamphlets, or other written materials from your doctor or pharmacy?: 1 - Never  Interpreter Needed?: No  Information entered by :: C.Ahmia Colford LPN   Activities of Daily Living    05/09/2023    9:27 AM  In your present state of health, do you have any difficulty performing the following activities:  Hearing? 0  Vision? 0  Difficulty concentrating or making decisions? 0  Walking or climbing stairs? 0  Dressing or bathing? 0  Doing errands, shopping? 0  Preparing Food and eating ? N  Using the Toilet? N  In the past six months, have you accidently leaked urine? N  Do you have problems with loss of bowel control? N  Managing your Medications? N  Managing your Finances? N  Housekeeping or managing your Housekeeping? N    Patient Care Team: Tower, Audrie Gallus, MD as PCP - General Bud Face, MD as Referring Physician (Otolaryngology) Kennedy Bucker, MD as Consulting Physician (Orthopedic Surgery) Brandy Hale, MD (Inactive) as Referring Physician (Psychiatry) Scot Jun, MD (Inactive) as Consulting Physician (Gastroenterology) Marcellus Scott, OD as Referring Physician (Optometry)  Indicate any recent Medical Services you may have received from other than [facility] providers in the past year (date may be approximate).     Assessment:   This is a  routine wellness examination for [name].  Hearing/Vision screen Hearing Screening - Comments:: Denies hearing difficulties   Vision Screening - Comments:: Glasses - [facility] - UTD on  eye exams  Dietary issues and exercise activities discussed:     Goals Addressed             This Visit's Progress    Patient Stated       Lose some weight       Depression Screen    05/09/2023   12:29 PM 04/25/2022    9:07 AM 04/23/2022   10:39 AM 04/21/2021   11:04 AM 04/13/2020    1:15 PM 06/30/2019   11:02 AM 04/03/2019    3:09 PM  PHQ 2/9 Scores  PHQ - 2 Score 0 0 0 0 0 1 0  PHQ- 9 Score    0 0 5 0    Fall Risk    05/09/2023    9:27 AM 04/25/2022    9:05 AM 04/23/2022   10:38 AM 04/21/2021   11:03 AM 04/13/2020    1:15 PM  Fall Risk   Falls in the past year? 0 1 1 0 0  Number falls in past yr: 0 0 0 0 0  Injury with Fall? 0 0 1 0 0  Risk for fall due to : No Fall Risks  Impaired vision  Medication side effect  Follow up Falls prevention discussed;Falls evaluation completed Falls evaluation completed;Education provided;Falls prevention discussed Falls evaluation completed  Falls evaluation completed;Falls prevention discussed    MEDICARE RISK AT HOME: Medicare Risk at Home Any stairs in or around the home?: No If so, are there any without handrails?: No Home free of loose throw rugs in walkways, pet beds, electrical cords, etc?: Yes Adequate lighting in your home to reduce risk of falls?: Yes Life alert?: No Use of a cane, walker or w/c?: No Grab bars in the bathroom?: Yes Shower chair or bench in shower?: Yes Elevated toilet seat or a handicapped toilet?: No  TIMED UP AND GO:  Was the test performed?  No    Cognitive Function:    04/13/2020    1:17 PM 04/03/2019    3:12 PM 03/28/2018   10:24 AM 03/07/2017    9:36 AM 03/07/2016   11:18 AM  MMSE - Mini Mental State Exam  Orientation to time 5 5 5 5 5   Orientation to Place 5 5 5 5 5   Registration 3 3 3 3 3   Attention/  Calculation 5 5 0 0 0  Recall 3 3 3 3 3   Language- name 2 objects  0 0 0 0  Language- repeat 1 1 1 1 1   Language- follow 3 step command  0 3 3 3   Language- read & follow direction  0 0 0 0  Write a sentence  0 0 0 0  Copy design  0 0 0 0  Total score  22 20 20 20         05/09/2023   12:35 PM 04/25/2022    9:09 AM  6CIT Screen  What Year? 0 points 0 points  What month? 0 points 0 points  What time? 0 points 0 points  Count back from 20 0 points 0 points  Months in reverse 0 points 0 points  Repeat phrase 0 points 0 points  Total Score 0 points 0 points    Immunizations Immunization History  Administered Date(s) Administered   COVID-19, mRNA, vaccine(Comirnaty)12 years and older 06/16/2022   H1N1 10/04/2008   Hepatitis B 01/16/1992, 02/16/1992, 08/17/1992   Influenza Split 07/18/2011, 06/04/2012   Influenza Whole 06/17/2008, 07/19/2009, 07/11/2010   Influenza, High Dose Seasonal PF 06/05/2017,  06/25/2019, 06/16/2022   Influenza,inj,Quad PF,6+ Mos 06/02/2013, 06/13/2016, 06/30/2018   Influenza,trivalent, recombinat, inj, PF 06/09/2015   Influenza-Unspecified 06/10/2014, 06/30/2018, 06/25/2019   PFIZER(Purple Top)SARS-COV-2 Vaccination 10/27/2019, 11/17/2019   Pneumococcal Polysaccharide-23 09/17/2002   Td 10/04/2008   Zoster Recombinant(Shingrix) 05/07/2018, 09/18/2018   Zoster, Live 05/25/2009    TDAP status: Due, Education has been provided regarding the importance of this vaccine. Advised may receive this vaccine at local pharmacy or Health Dept. Aware to provide a copy of the vaccination record if obtained from local pharmacy or Health Dept. Verbalized acceptance and understanding.  Flu Vaccine status: Due, Education has been provided regarding the importance of this vaccine. Advised may receive this vaccine at local pharmacy or Health Dept. Aware to provide a copy of the vaccination record if obtained from local pharmacy or Health Dept. Verbalized acceptance and  understanding.  Pneumococcal vaccine status: Declined,  Education has been provided regarding the importance of this vaccine but patient still declined. Advised may receive this vaccine at local pharmacy or Health Dept. Aware to provide a copy of the vaccination record if obtained from local pharmacy or Health Dept. Verbalized acceptance and understanding.   Covid-19 vaccine status: Information provided on how to obtain vaccines.   Qualifies for Shingles Vaccine? Yes   Zostavax completed Yes   Shingrix Completed?: Yes  Screening Tests Health Maintenance  Topic Date Due   Pneumonia Vaccine 52+ Years old (2 of 2 - PCV) 05/13/2011   DTaP/Tdap/Td (2 - Tdap) 10/04/2018   COVID-19 Vaccine (4 - 2023-24 season) 08/11/2022   INFLUENZA VACCINE  04/18/2023   MAMMOGRAM  07/13/2023   Medicare Annual Wellness (AWV)  05/08/2024   DEXA SCAN  Completed   Hepatitis C Screening  Completed   Zoster Vaccines- Shingrix  Completed   HPV VACCINES  Aged Out   Colonoscopy  Discontinued    Health Maintenance  Health Maintenance Due  Topic Date Due   Pneumonia Vaccine 32+ Years old (2 of 2 - PCV) 05/13/2011   DTaP/Tdap/Td (2 - Tdap) 10/04/2018   COVID-19 Vaccine (4 - 2023-24 season) 08/11/2022   INFLUENZA VACCINE  04/18/2023    Colorectal cancer screening: No longer required.   Mammogram status: Ordered 05/09/23. Pt provided with contact info and advised to call to schedule appt.   Bone Density status: Ordered 05/09/23. Pt provided with contact info and advised to call to schedule appt.  Lung Cancer Screening: (Low Dose CT Chest recommended if Age 47-80 years, 20 pack-year currently smoking OR have quit w/in 15years.) does not qualify.   Lung Cancer Screening Referral:    Additional Screening:  Hepatitis C Screening: does qualify; Completed 05/28/16  Vision Screening: Recommended annual ophthalmology exams for early detection of glaucoma and other disorders of the eye. Is the patient up to  date with their annual eye exam?  Yes  Who is the provider or what is the name of the office in which the patient attends annual eye exams? [facility] If pt is not established with a provider, would they like to be referred to a provider to establish care? Yes .   Dental Screening: Recommended annual dental exams for proper oral hygiene   Community Resource Referral / Chronic Care Management: CRR required this visit?  No   CCM required this visit?  No     Plan:     I have personally reviewed and noted the following in the patient's chart:   Medical and social history Use of alcohol, tobacco or illicit drugs  Current medications and supplements including opioid prescriptions. Patient is not currently taking opioid prescriptions. Functional ability and status Nutritional status Physical activity Advanced directives List of other physicians Hospitalizations, surgeries, and ER visits in previous 12 months Vitals Screenings to include cognitive, depression, and falls Referrals and appointments  In addition, I have reviewed and discussed with patient certain preventive protocols, quality metrics, and best practice recommendations. A written personalized care plan for preventive services as well as general preventive health recommendations were provided to patient.     Maryan Puls, LPN   7/82/9562   After Visit Summary: (MyChart) Due to this being a telephonic visit, the after visit summary with patients personalized plan was offered to patient via MyChart   Nurse Notes: none

## 2023-05-09 NOTE — Patient Instructions (Addendum)
[name] , Thank you for taking time to come for your Medicare Wellness Visit. I appreciate your ongoing commitment to your health goals. Please review the following plan we discussed and let me know if I can assist you in the future.   Referrals/Orders/Follow-Ups/Clinician Recommendations: Aim for 30 minutes of exercise or brisk walking, 6-8 glasses of water, and 5 servings of fruits and vegetables each day.   You have an order for:  []   2D Mammogram  [x]   3D Mammogram  [x]   Bone Density     Please call for appointment:  [facility]  [address] [place] [address] [phone]  [facility] [address] [place], [place] [address] [phone]  [facility] [address] Geanie Logan [place], [place] [address] [phone]    Make sure to wear two-piece clothing.  No lotions, powders, or deodorants the day of the appointment. Make sure to bring picture ID and insurance card.  Bring list of medications you are currently taking including any supplements.   Schedule your [place] screening mammogram through MyChart!   Log into your MyChart account.  Go to 'Visit' (or 'Appointments' if on mobile App) --> Schedule an Appointment  Under 'Select a Reason for Visit' choose the Mammogram Screening option.  Complete the pre-visit questions and select the time and place that best fits your schedule.    This is a list of the screening recommended for you and due dates:  Health Maintenance  Topic Date Due   Pneumonia Vaccine (2 of 2 - PCV) 05/13/2011   DTaP/Tdap/Td vaccine (2 - Tdap) 10/04/2018   COVID-19 Vaccine (4 - 2023-24 season) 08/11/2022   Medicare Annual Wellness Visit  04/26/2023   Flu Shot  04/18/2023   Mammogram  07/13/2023   DEXA scan (bone density measurement)  Completed   Hepatitis C Screening  Completed   Zoster (Shingles) Vaccine   Completed   HPV Vaccine  Aged Out   Colon Cancer Screening  Discontinued    Advanced directives: (Copy Requested) Please bring a copy of your health care power of attorney and living will to the office to be added to your chart at your convenience.  Next Medicare Annual Wellness Visit scheduled for next year: Yes

## 2023-05-10 LAB — VITAMIN B12: Vitamin B-12: 642 pg/mL (ref 211–911)

## 2023-05-10 LAB — TSH: TSH: 6.1 u[IU]/mL — ABNORMAL HIGH (ref 0.35–5.50)

## 2023-05-10 LAB — VITAMIN D 25 HYDROXY (VIT D DEFICIENCY, FRACTURES): VITD: 51.11 ng/mL (ref 30.00–100.00)

## 2023-05-15 ENCOUNTER — Encounter: Payer: Self-pay | Admitting: Family Medicine

## 2023-05-15 ENCOUNTER — Ambulatory Visit (INDEPENDENT_AMBULATORY_CARE_PROVIDER_SITE_OTHER): Payer: Medicare PPO | Admitting: Family Medicine

## 2023-05-15 VITALS — BP 128/72 | HR 96 | Temp 97.9°F | Ht 63.75 in | Wt 174.0 lb

## 2023-05-15 DIAGNOSIS — Z79899 Other long term (current) drug therapy: Secondary | ICD-10-CM

## 2023-05-15 DIAGNOSIS — E7800 Pure hypercholesterolemia, unspecified: Secondary | ICD-10-CM

## 2023-05-15 DIAGNOSIS — K219 Gastro-esophageal reflux disease without esophagitis: Secondary | ICD-10-CM

## 2023-05-15 DIAGNOSIS — M85859 Other specified disorders of bone density and structure, unspecified thigh: Secondary | ICD-10-CM | POA: Diagnosis not present

## 2023-05-15 DIAGNOSIS — Z23 Encounter for immunization: Secondary | ICD-10-CM | POA: Diagnosis not present

## 2023-05-15 DIAGNOSIS — R7303 Prediabetes: Secondary | ICD-10-CM

## 2023-05-15 DIAGNOSIS — E669 Obesity, unspecified: Secondary | ICD-10-CM

## 2023-05-15 DIAGNOSIS — I10 Essential (primary) hypertension: Secondary | ICD-10-CM

## 2023-05-15 DIAGNOSIS — F411 Generalized anxiety disorder: Secondary | ICD-10-CM | POA: Diagnosis not present

## 2023-05-15 DIAGNOSIS — Z0000 Encounter for general adult medical examination without abnormal findings: Secondary | ICD-10-CM | POA: Diagnosis not present

## 2023-05-15 DIAGNOSIS — E039 Hypothyroidism, unspecified: Secondary | ICD-10-CM

## 2023-05-15 DIAGNOSIS — N3946 Mixed incontinence: Secondary | ICD-10-CM

## 2023-05-15 MED ORDER — LEVOTHYROXINE SODIUM 75 MCG PO TABS: 75.0000 ug | ORAL_TABLET | Freq: Every day | ORAL | 3 refills | Status: AC

## 2023-05-15 NOTE — Assessment & Plan Note (Signed)
Lab Results  Component Value Date   TSH 6.10 (H) 05/09/2023   Will increase her levothyroxine to 75 mcg and re check in 6 wk No clinical changes

## 2023-05-15 NOTE — Assessment & Plan Note (Signed)
Lab Results  Component Value Date   HGBA1C 6.5 05/09/2023   This is up disc imp of low glycemic diet and wt loss to prevent DM2   Follow up 3 mo for visit and re check

## 2023-05-15 NOTE — Assessment & Plan Note (Signed)
 Discussed how this problem influences overall health and the risks it imposes  Reviewed plan for weight loss with lower calorie diet (via better food choices (lower glycemic and portion control) along with exercise building up to or more than 30 minutes 5 days per week including some aerobic activity and strength training

## 2023-05-15 NOTE — Assessment & Plan Note (Signed)
Reviewed health habits including diet and exercise and skin cancer prevention Reviewed appropriate screening tests for age  Also reviewed health mt list, fam hx and immunization status , as well as social and family history   See HPI Labs reviewed and ordered Flu shot given  Allergic to pna vaccine  Will get Td at pharmacy and had RSV vaccine  Dexa and mammo planned for oct  Colonoscopy 2022 PHQ 0  Encouraged more exercise as tolerated Utd derm care

## 2023-05-15 NOTE — Assessment & Plan Note (Signed)
Severely high  Disc goals for lipids and reasons to control them Rev last labs with pt Rev low sat fat diet in detail  Intol of any dose of statin and zetia  In past ins did not cover pcyk9  Will refer to pharmacist to see if we can get help with this  Watching diet

## 2023-05-15 NOTE — Progress Notes (Signed)
Subjective:    Patient ID: [name], female    DOB: 04/16/1946, 77 y.o.   MRN: [id number]  HPI  Here for health maintenance exam and to review chronic medical problems   Wt Readings from Last 3 Encounters:  05/15/23 174 lb (78.9 kg)  05/09/23 172 lb (78 kg)  08/23/22 173 lb 8 oz (78.7 kg)   30.10 kg/m  Vitals:   05/15/23 1052  BP: 128/72  Pulse: 96  Temp: 97.9 F (36.6 C)  SpO2: 92%     Immunization History  Administered Date(s) Administered   COVID-19, mRNA, vaccine(Comirnaty)12 years and older 06/16/2022   Fluad Trivalent(High Dose 65+) 05/15/2023   H1N1 10/04/2008   Hepatitis B 01/16/1992, 02/16/1992, 08/17/1992   Influenza Split 07/18/2011, 06/04/2012   Influenza Whole 06/17/2008, 07/19/2009, 07/11/2010   Influenza, High Dose Seasonal PF 06/05/2017, 06/25/2019, 06/16/2022   Influenza,inj,Quad PF,6+ Mos 06/02/2013, 06/13/2016, 06/30/2018   Influenza,trivalent, recombinat, inj, PF 06/09/2015   Influenza-Unspecified 06/10/2014, 06/30/2018, 06/25/2019   PFIZER(Purple Top)SARS-COV-2 Vaccination 10/27/2019, 11/17/2019   Pneumococcal Polysaccharide-23 09/17/2002   RSV,unspecified 07/10/2022   Td 10/04/2008   Zoster Recombinant(Shingrix) 05/07/2018, 09/18/2018   Zoster, Live 05/25/2009    Health Maintenance Due  Topic Date Due   DTaP/Tdap/Td (2 - Tdap) 10/04/2018   Feels ok ok  Has some vertigo occational Saw ENT Had testing  Will  have some hearing testing /history of lot of left ear infections     Pna vaccine-cannot have due to allergy   Td -will update   Flu shot -today  Will get covid shot in fall   Had her RSV vaccine    Mammogram due in oct-pt has appt already at [facility] for oct 29 Self breast exam- no lumps or changes   Gyn health No problems     Colon cancer screening - colonoscopy 2022   No recall due to age   Bone health -osteopenia  Dexa  06/2021, has one scheduled for oct 29th at [facility]- none  Fractures- had a  broken toe- does not recall trauma / was in a boot for 8 weeks  Supplements  Last vitamin D Lab Results  Component Value Date   VD25OH 51.11 05/09/2023   Exercise - ready to start since getting out of boot  Got out her PT exercises to start  They give her cardio-uses bands and some in chair / some walking   Dermatology care  Just went 2 months ago - still dealing with a lesion on right hand that won't heal  Not a cancer  Has tried chemo cream/ steroid shot -keeps peeling   She uses sun protection     Mood    05/15/2023   10:56 AM 05/09/2023   12:29 PM 04/25/2022    9:07 AM 04/23/2022   10:39 AM 04/21/2021   11:04 AM  Depression screen PHQ 2/9  Decreased Interest 0 0 0 0 0  Down, Depressed, Hopeless 0 0 0 0 0  PHQ - 2 Score 0 0 0 0 0  Altered sleeping 0    0  Tired, decreased energy 0    0  Change in appetite 0    0  Feeling bad or failure about yourself  0    0  Trouble concentrating 0    0  Moving slowly or fidgety/restless 0    0  Suicidal thoughts 0    0  PHQ-9 Score 0    0  Difficult doing work/chores Not difficult at  all          05/15/2023   10:56 AM 06/30/2019   11:02 AM  GAD 7 : Generalized Anxiety Score  Nervous, Anxious, on Edge 0 2  Control/stop worrying 0 1  Worry too much - different things 0 1  Trouble relaxing 0 1  Restless 0 0  Easily annoyed or irritable 0 0  Afraid - awful might happen 0 2  Total GAD 7 Score 0 7  Anxiety Difficulty Not difficult at all Somewhat difficult    Doing really well overall  GAD Takes zoloft 100 mg daily  Xanax 0.25 mg at bedtime prn     HTN bp is stable today  No cp or palpitations or headaches or edema  No side effects to medicines  BP Readings from Last 3 Encounters:  05/15/23 128/72  08/23/22 126/68  04/23/22 108/66    Altace 5 mg daily  Triam/ hct 37.5-25 mg daily   Lab Results  Component Value Date   NA 136 05/09/2023   K 3.9 05/09/2023   CO2 27 05/09/2023   GLUCOSE 128 (H) 05/09/2023   BUN 25  (H) 05/09/2023   CREATININE 1.10 05/09/2023   CALCIUM 9.4 05/09/2023   GFR 48.65 (L) 05/09/2023   GFRNONAA >60 03/08/2022   GERD Omeprazole 20 mg 2 in am and 1 in pm   Gets rebound if she does stop it   Lab Results  Component Value Date   VITAMINB12 642 05/09/2023   Hypothyroidism  Pt has no clinical changes No change in energy level/ hair or skin/ edema and no tremor Lab Results  Component Value Date   TSH 6.10 (H) 05/09/2023    Levothyroxine 50 mcg daily  No missed doses  Takes it 4: 30 am and goes back to bed     Hyperlipidemia Lab Results  Component Value Date   CHOL 370 (H) 05/09/2023   CHOL 313 (H) 04/18/2022   CHOL 327 (H) 04/19/2021   Lab Results  Component Value Date   HDL 45.00 05/09/2023   HDL 41.20 04/18/2022   HDL 42.40 04/19/2021   Lab Results  Component Value Date   LDLCALC 294 (H) 05/09/2023   LDLCALC 250 (H) 04/18/2022   LDLCALC 258 (H) 04/19/2021   Lab Results  Component Value Date   TRIG 153.0 (H) 05/09/2023   TRIG 110.0 04/18/2022   TRIG 133.0 04/19/2021   Lab Results  Component Value Date   CHOLHDL 8 05/09/2023   CHOLHDL 8 04/18/2022   CHOLHDL 8 04/19/2021   Lab Results  Component Value Date   LDLDIRECT 242.8 01/15/2012   LDLDIRECT 237.0 12/18/2010   LDLDIRECT 227.8 09/20/2009   Intol to statins and zetia  Saw cardiology in 2018  Ins would no tpay for pcyk9 drug in past  Takes omega 3 supplement      Prediabetes Lab Results  Component Value Date   HGBA1C 6.5 05/09/2023   This is up from 6.2   Diet is good  Was not exercising as much due to the broken toe   Urinary incontinence Gemtasa Estrace cream    Patient Active Problem List   Diagnosis Date Noted   Current use of proton pump inhibitor 04/22/2023   Lumbar degenerative disc disease 11/07/2021   Encounter for screening mammogram for breast cancer 04/21/2021   Osteopenia 02/13/2016   Routine general medical examination at a health care facility  01/23/2016   Estrogen deficiency 01/23/2016   Hypothyroid 01/23/2016   Mild depression 02/21/2015  H/O diabetes mellitus 01/04/2015   H/O arthritis 01/04/2015   History of hay fever 01/04/2015   Encounter for Medicare annual wellness exam 01/14/2013   Obesity (BMI 30-39.9) 01/15/2012   MENOPAUSAL SYNDROME 10/04/2008   Prediabetes 12/02/2007   TREMOR 12/02/2007   Hyperlipidemia 01/28/2007   Generalized anxiety disorder 01/28/2007   Essential hypertension 01/28/2007   GERD 01/28/2007   URINARY INCONTINENCE 01/28/2007   COLONIC POLYPS, HX OF 01/28/2007   HERPES ZOSTER 01/20/2007   DYSPHONIA 01/20/2007   COUGH, CHRONIC 01/20/2007   ALLERGY 01/20/2007   Past Medical History:  Diagnosis Date   Allergic rhinitis    gets allergy shot from Dr. Chestine Spore   Allergy    Anxiety    Arthritis    Diabetes mellitus without complication (HCC)    diet controlled   GERD (gastroesophageal reflux disease)    History of shingles    at age 45- mild   Hx of colonic polyp    Hyperglycemia    Hyperlipemia    Very high chol intol of statins-does not want to check it   Hypertension    Panic disorder    Tremor    from anx   Urinary incontinence    mixed   Voice disorder    Past Surgical History:  Procedure Laterality Date   ABDOMINAL HYSTERECTOMY     BLADDER SUSPENSION     BREAST BIOPSY Left 08/21/2022   stereo bx, calcs, "COIL" clip-path pending   BREAST BIOPSY Left 08/21/2022   MM LT BREAST BX W LOC DEV 1ST LESION IMAGE BX SPEC STEREO GUIDE 08/21/2022 [facility]   COLON SURGERY     COLONOSCOPY WITH PROPOFOL N/A 12/24/2016   Procedure: COLONOSCOPY WITH PROPOFOL;  Surgeon: Scot Jun, MD;  Location: [facility] ENDOSCOPY;  Service: Endoscopy;  Laterality: N/A;   COLONOSCOPY WITH PROPOFOL N/A 04/17/2021   Procedure: COLONOSCOPY WITH PROPOFOL;  Surgeon: Regis Bill, MD;  Location: [facility] ENDOSCOPY;  Service: Endoscopy;  Laterality: N/A;   ESOPHAGOGASTRODUODENOSCOPY (EGD) WITH  PROPOFOL N/A 12/24/2016   Procedure: ESOPHAGOGASTRODUODENOSCOPY (EGD) WITH PROPOFOL;  Surgeon: Scot Jun, MD;  Location: [facility] ENDOSCOPY;  Service: Endoscopy;  Laterality: N/A;   EYE SURGERY Bilateral 2000   eye lens replacement   HAND SURGERY Left 2016   left long trigger finger release   JOINT REPLACEMENT     knee replacement - march 15   KNEE ARTHROSCOPY Left 04/26/2016   Procedure: ARTHROSCOPY KNEE;  Surgeon: Kennedy Bucker, MD;  Location: [facility] ORS;  Service: Orthopedics;  Laterality: Left;   MEDIAL PARTIAL KNEE REPLACEMENT Left 2016   NASAL SINUS SURGERY     PARTIAL HYSTERECTOMY     with fibroid   RECTOCELE REPAIR     SYNOVECTOMY Left 04/26/2016   Procedure: PARTIAL SYNOVECTOMY;  Surgeon: Kennedy Bucker, MD;  Location: [facility] ORS;  Service: Orthopedics;  Laterality: Left;   TONSILLECTOMY     Social History   Tobacco Use   Smoking status: Never    Passive exposure: Never   Smokeless tobacco: Never  Vaping Use   Vaping status: Never Used  Substance Use Topics   Alcohol use: Yes    Comment: rare   Drug use: No   Family History  Problem Relation Age of Onset   Diabetes Mother    Hyperlipidemia Mother    Hypertension Mother    Osteoarthritis Mother    Cancer Father        Colon   Leukemia Sister    Syncope episode Daughter  Breast cancer Neg Hx    Allergies  Allergen Reactions   Atorvastatin     REACTION: shoulder pain   Ezetimibe     REACTION: ?   Lisinopril     REACTION: muscle pain   Molds & Smuts    Penicillamine    Penicillins     REACTION: reaction not known Has patient had a PCN reaction causing immediate rash, facial/tongue/throat swelling, SOB or lightheadedness with hypotension: unknown Has patient had a PCN reaction causing severe rash involving mucus membranes or skin necrosis: unknown Has patient had a PCN reaction that required hospitalization unsure Has patient had a PCN reaction occurring within the last 10 years: no If all of the above  answers are "NO", then may proceed with Cephalosporin use.   Pneumococcal Vaccine Swelling    REACTION: severe local reaction   Pneumococcal Vaccine Polyvalent     REACTION: severe local reaction   Pollen Extract    Rosuvastatin     REACTION: leg pain   Simvastatin     REACTION: muscle pain   Current Outpatient Medications on File Prior to Visit  Medication Sig Dispense Refill   ALPRAZolam (XANAX) 0.25 MG tablet TAKE ONE TABLET BY MOUTH AT BEDTIME AS NEEDED FOR ANXIETY 90 tablet 0   Azelastine HCl (ASTEPRO) 0.15 % SOLN Place 1 spray into the nose at bedtime.     Calcium Carbonate-Vitamin D (CALCIUM 600+D PO) Take 1 tablet by mouth 2 (two) times daily.      EPINEPHrine 0.3 mg/0.3 mL IJ SOAJ injection Inject 0.3 mg into the skin once. as needed.     estradiol (ESTRACE) 0.1 MG/GM vaginal cream Place 1 Applicatorful vaginally 3 (three) times a week.     levocetirizine (XYZAL) 5 MG tablet Take 1 tablet by mouth daily.     Melatonin 10 MG TABS Take 1 tablet by mouth at bedtime.     Omega-3 Fatty Acids (FISH OIL) 1200 MG CAPS Take 1 capsule by mouth daily.      omeprazole (PRILOSEC) 20 MG capsule TAKE 2 CAPSULES BY MOUTH EVERY MORNING AND 1 CAPSULE IN THE EVENING 270 capsule 1   ramipril (ALTACE) 5 MG capsule TAKE 1 CAPSULE BY MOUTH ONCE DAILY 90 capsule 1   sertraline (ZOLOFT) 100 MG tablet TAKE 1 TABLET BY MOUTH DAILY 90 tablet 0   triamterene-hydrochlorothiazide (MAXZIDE-25) 37.5-25 MG tablet TAKE 1/2 TABLET EVERYDAY 45 tablet 0   Vibegron (GEMTESA) 75 MG TABS Take 1 tablet (75 mg total) by mouth daily. 90 tablet 1   No current facility-administered medications on file prior to visit.    Review of Systems  Constitutional:  Negative for activity change, appetite change, fatigue, fever and unexpected weight change.  HENT:  Negative for congestion, ear pain, rhinorrhea, sinus pressure and sore throat.   Eyes:  Negative for pain, redness and visual disturbance.  Respiratory:  Negative for  cough, shortness of breath and wheezing.   Cardiovascular:  Negative for chest pain and palpitations.  Gastrointestinal:  Negative for abdominal pain, blood in stool, constipation and diarrhea.  Endocrine: Negative for polydipsia and polyuria.  Genitourinary:  Negative for dysuria, frequency and urgency.  Musculoskeletal:  Positive for arthralgias. Negative for back pain and myalgias.  Skin:  Negative for pallor and rash.  Allergic/Immunologic: Negative for environmental allergies.  Neurological:  Negative for dizziness, syncope and headaches.       Occational vertigo Not today  Hematological:  Negative for adenopathy. Does not bruise/bleed easily.  Psychiatric/Behavioral:  Negative  for decreased concentration and dysphoric mood. The patient is nervous/anxious.        Objective:   Physical Exam Constitutional:      General: She is not in acute distress.    Appearance: Normal appearance. She is well-developed. She is obese. She is not ill-appearing or diaphoretic.  HENT:     Head: Normocephalic and atraumatic.     Right Ear: Tympanic membrane, ear canal and external ear normal.     Left Ear: Tympanic membrane, ear canal and external ear normal.     Nose: Nose normal. No congestion.     Mouth/Throat:     Mouth: Mucous membranes are moist.     Pharynx: Oropharynx is clear. No posterior oropharyngeal erythema.  Eyes:     General: No scleral icterus.    Extraocular Movements: Extraocular movements intact.     Conjunctiva/sclera: Conjunctivae normal.     Pupils: Pupils are equal, round, and reactive to light.  Neck:     Thyroid: No thyromegaly.     Vascular: No carotid bruit or JVD.  Cardiovascular:     Rate and Rhythm: Normal rate and regular rhythm.     Pulses: Normal pulses.     Heart sounds: Normal heart sounds.     No gallop.  Pulmonary:     Effort: Pulmonary effort is normal. No respiratory distress.     Breath sounds: Normal breath sounds. No wheezing.     Comments: Good  air exch Chest:     Chest wall: No tenderness.  Abdominal:     General: Bowel sounds are normal. There is no distension or abdominal bruit.     Palpations: Abdomen is soft. There is no mass.     Tenderness: There is no abdominal tenderness.     Hernia: No hernia is present.  Genitourinary:    Comments: Breast exam: No mass, nodules, thickening, tenderness, bulging, retraction, inflamation, nipple discharge or skin changes noted.  No axillary or clavicular LA.     Musculoskeletal:        General: No tenderness. Normal range of motion.     Cervical back: Normal range of motion and neck supple. No rigidity. No muscular tenderness.     Right lower leg: No edema.     Left lower leg: No edema.     Comments: No kyphosis   Lymphadenopathy:     Cervical: No cervical adenopathy.  Skin:    General: Skin is warm and dry.     Coloration: Skin is not pale.     Findings: No erythema or rash.     Comments: Solar lentigines diffusely   Neurological:     Mental Status: She is alert. Mental status is at baseline.     Cranial Nerves: No cranial nerve deficit.     Motor: No abnormal muscle tone.     Coordination: Coordination normal.     Gait: Gait normal.     Deep Tendon Reflexes: Reflexes are normal and symmetric. Reflexes normal.  Psychiatric:        Mood and Affect: Mood normal.        Cognition and Memory: Cognition and memory normal.           Assessment & Plan:   Problem List Items Addressed This Visit       Cardiovascular and Mediastinum   Essential hypertension    bp in fair control at this time  BP Readings from Last 1 Encounters:  05/15/23 128/72   No changes needed  Most recent labs reviewed  Disc lifstyle change with low sodium diet and exercise  Doing better with lower dose of ace, no more dizziness Plan to continue Altace 5 mg daily  Triam/hct 37.5-25 mg daily        Digestive   GERD    Urged to try and wean omep to 20 mg bid         Endocrine    Hypothyroid    Lab Results  Component Value Date   TSH 6.10 (H) 05/09/2023   Will increase her levothyroxine to 75 mcg and re check in 6 wk No clinical changes       Relevant Medications   levothyroxine (SYNTHROID) 75 MCG tablet     Musculoskeletal and Integument   Osteopenia    Dexa scheduled in oct  Not candidate for oral bisphos due to severe GERD  Taking vit D One fragility fracture- toe  No falls   Ref to pharmacy  Would like to see if prolia is affordable Encouraged strength building exercise as tolerated         Other   Current use of proton pump inhibitor    B12 and D levels ok  Encouraged to try and wean to bid 20 mg omeprazole  Discussed breakthorugh symptom expectation  GFR has gone down  Will update if unable to tolerate       Generalized anxiety disorder    Stable and well controlled  Zoloft 100 mg daily -wants to continue Xanax prn wit hcaution       Hyperlipidemia    Severely high  Disc goals for lipids and reasons to control them Rev last labs with pt Rev low sat fat diet in detail  Intol of any dose of statin and zetia  In past ins did not cover pcyk9  Will refer to pharmacist to see if we can get help with this  Watching diet       Relevant Orders   AMB Referral to Pharmacy Medication Management   Obesity (BMI 30-39.9)    Discussed how this problem influences overall health and the risks it imposes  Reviewed plan for weight loss with lower calorie diet (via better food choices (lower glycemic and portion control) along with exercise building up to or more than 30 minutes 5 days per week including some aerobic activity and strength training         Prediabetes    Lab Results  Component Value Date   HGBA1C 6.5 05/09/2023   This is up disc imp of low glycemic diet and wt loss to prevent DM2   Follow up 3 mo for visit and re check      Routine general medical examination at a health care facility - Primary    Reviewed health  habits including diet and exercise and skin cancer prevention Reviewed appropriate screening tests for age  Also reviewed health mt list, fam hx and immunization status , as well as social and family history   See HPI Labs reviewed and ordered Flu shot given  Allergic to pna vaccine  Will get Td at pharmacy and had RSV vaccine  Dexa and mammo planned for oct  Colonoscopy 2022 PHQ 0  Encouraged more exercise as tolerated Utd derm care       URINARY INCONTINENCE    Continues gemtasa and estrogen vag cream       Other Visit Diagnoses     Need for influenza vaccination  Relevant Orders   Flu Vaccine Trivalent High Dose (Fluad) (Completed)

## 2023-05-15 NOTE — Assessment & Plan Note (Signed)
Continues gemtasa and estrogen vag cream

## 2023-05-15 NOTE — Patient Instructions (Addendum)
Check with your pharmacist if you want to update tetanus shot   Do what you tolerate for exercise  Add some strength training to your routine, this is important for bone and brain health and can reduce your risk of falls and help your body use insulin properly and regulate weight  Light weights, exercise bands , and internet videos are a good way to start  Yoga (chair or regular), machines , floor exercises or a gym with machines are also good options    Go down on omeprazole 20 mg twice daily  You will have heartburn /reflux symptoms for 1-2 weeks If severe let me know  Tums or pepcid ac/ pepcid complete are ok   If no improvement after 2 weeks let me know   I am going to go up on dose of levothyroxine to 75 mcg  We will check labs for this in about 6 weeks  Keep up a good fluid intake I will also check that in 6 weeks   I put the referral in for a pharmacist to discuss cholesterol and osteoporosis  Please let us know if you don't hear in 1-2 weeks   Follow up in 3 months for blood sugar  Try to get most of your carbohydrates from produce (with the exception of white potatoes) and whole grains Eat less bread/pasta/rice/snack foods/cereals/sweets and other items from the middle of the grocery store (processed carbs)

## 2023-05-15 NOTE — Assessment & Plan Note (Signed)
Stable and well controlled  Zoloft 100 mg daily -wants to continue Xanax prn wit hcaution

## 2023-05-15 NOTE — Assessment & Plan Note (Signed)
bp in fair control at this time  BP Readings from Last 1 Encounters:  05/15/23 128/72   No changes needed Most recent labs reviewed  Disc lifstyle change with low sodium diet and exercise  Doing better with lower dose of ace, no more dizziness Plan to continue Altace 5 mg daily  Triam/hct 37.5-25 mg daily

## 2023-05-15 NOTE — Assessment & Plan Note (Signed)
Dexa scheduled in oct  Not candidate for oral bisphos due to severe GERD  Taking vit D One fragility fracture- toe  No falls   Ref to pharmacy  Would like to see if prolia is affordable Encouraged strength building exercise as tolerated

## 2023-05-15 NOTE — Assessment & Plan Note (Signed)
Urged to try and wean omep to 20 mg bid

## 2023-05-15 NOTE — Assessment & Plan Note (Signed)
B12 and D levels ok  Encouraged to try and wean to bid 20 mg omeprazole  Discussed breakthorugh symptom expectation  GFR has gone down  Will update if unable to tolerate

## 2023-05-27 ENCOUNTER — Encounter: Payer: Self-pay | Admitting: Family Medicine

## 2023-05-27 ENCOUNTER — Telehealth: Payer: Self-pay

## 2023-05-27 DIAGNOSIS — M85859 Other specified disorders of bone density and structure, unspecified thigh: Secondary | ICD-10-CM

## 2023-05-27 NOTE — Telephone Encounter (Signed)
Prolia VOB initiated via [email or web address] 

## 2023-05-27 NOTE — Telephone Encounter (Signed)
She is talking about a PCYK9 inhibitor - I would like to start one if it is covered

## 2023-05-27 NOTE — Progress Notes (Signed)
   Care Guide Note  05/27/2023 Name: [name] MRN: [id number] DOB: 26-Nov-1945  Referred by: Tower, Audrie Gallus, MD Reason for referral : Care Coordination (Outreach to schedule with Pharm d )   [name] is a 77 y.o. year old female who is a primary care patient of Tower, Audrie Gallus, MD. [name] was referred to the pharmacist for assistance related to HLD.    Successful contact was made with the patient to discuss pharmacy services including being ready for the pharmacist to call at least 5 minutes before the scheduled appointment time, to have medication bottles and any blood sugar or blood pressure readings ready for review. The patient agreed to meet with the pharmacist via with the pharmacist via telephone visit on (date/time).  05/29/2023  Penne Lash, RMA Care Guide [facility]  [place], [place] [address] Direct Dial: [phone] [email or web address]

## 2023-05-27 NOTE — Telephone Encounter (Signed)
-----   Message from Loree Fee sent at 05/27/2023  3:16 PM EDT ----- Regarding: Test claim - Prolia Hello!   Can we see if a test claim for Prolia injection can be done for this patient, please? She cannot take oral bisphosphonates per PCP due to uncontrolled GERD.   Thank you!

## 2023-05-27 NOTE — Telephone Encounter (Signed)
Will route to pharmacy dpt and PCP

## 2023-05-29 ENCOUNTER — Other Ambulatory Visit (HOSPITAL_COMMUNITY): Payer: Self-pay

## 2023-05-29 ENCOUNTER — Telehealth: Payer: Self-pay | Admitting: Pharmacy Technician

## 2023-05-29 ENCOUNTER — Other Ambulatory Visit: Payer: Medicare PPO | Admitting: Pharmacist

## 2023-05-29 DIAGNOSIS — E7800 Pure hypercholesterolemia, unspecified: Secondary | ICD-10-CM

## 2023-05-29 NOTE — Telephone Encounter (Signed)
Pharmacy Patient Advocate Encounter   Received notification from  [facility] Portal that prior authorization for PROLIA is required/requested.   Insurance verification completed.   The patient is insured through [employer] .   Per test claim: PA required; PA submitted to [facility] via CoverMyMeds Key/confirmation #/EOC [id number] Status is pending

## 2023-05-29 NOTE — Telephone Encounter (Signed)
-----   Message from [name] sent at 05/29/2023 12:03 PM EDT ----- Regarding: Prior authorization Repatha Can we please try for a test claim to see what Repatha would cost patient? From what I can see of her [facility] plan, I believe it should be covered. She has tried and failed 3 statins and ezetimibe. See today's phone note for details as needed.   Repatha 140 mg subQ every 14 days

## 2023-05-29 NOTE — Patient Instructions (Signed)
[name],   It was a pleasure to see you today! As we discussed:?  - I have reached out to our team to investigate the potential copay of the cholesterol injection medication. The brand name of this medication in Repatha  I will plan to follow up via MyChart message and phone call once I hear back regardign insurance coverage of the medication.   I have included general medication information on Repatha below.   Berenice Primas, PharmD - Clinical Pharmacist   Medication & Administration    Dosage: Inject the contents of 1 pen (140mg ) under the skin every 2 weeks.   Administration: Administer under the skin of the abdomen, thigh or upper arm. Rotate sites with each injection. Injection instructions - Syringe: Remove 1 Repatha syringe and let stand at room temperature for at least 30 minutes. Check the syringe for the following: Expiration date Absence of any cracks or damage The medicine is clear and colorless and does not contain any particles Choose your injection site and clean with an alcohol wipe. Allow to air dry completely. Pull the gray needle cap straight off and dispose of it Pinch the skin (or stretch) with your thumb and fingers creating an area 2 inches wide Maintaining the pinch (or stretch) insert the needle at a 45 to 90 degree angle When you are ready to inject, slowly and steadily press the plunger rod down all the way until the syringe is empty When done, release your thumb and gently lift the syringe straight off the skin Dispose of the syringe in a sharps container. Do not try to recap the syringe with the gray needle cap. If there is blood at the injection site, press a cotton ball or gauze to the site. Do not rub the injection site. Adherence/Missed dose instructions: Administer a missed dose within 7 days and resume your normal schedule.  If it has been more than 7 days and you inject every 2 weeks, skip the missed dose and resume your normal schedule..       Goals of Therapy    Lower cholesterol, prevention of cardiovascular events in patients with established cardiovascular disease     Side Effects & Monitoring Parameters  Minor side effects Flu-like symptoms Signs of a common cold Injection site irritation Nose or throat irritation   Storage, Handling Precautions, & Disposal  Repatha should be stored in the refrigerator. If necessary, Repatha may be kept at room temperature for no more than 30 days. Place used devices in a sharps container for disposal.

## 2023-05-29 NOTE — Progress Notes (Signed)
05/29/2023 Name: [name] MRN: [id number] DOB: [birth year]  No chief complaint on file.   [name] is a 77 y.o. year old female who presented for a telephone visit.   They were referred to the pharmacist by their PCP for assistance in managing hyperlipidemia due to previous intolerances of oral therapies. Repatha was prescribed in the past (several years ago) but cost was prohibitive at that time.    Subjective:  Care Team: Primary Care Provider: Judy Pimple, MD ; Next Scheduled Visit: 08/19/23  Medication Access/Adherence  Current Pharmacy:  [facility] - [place], [place] - [address] [place] [address] Phone: [phone] Fax: [phone]  [facility] - [place], [place] - [address] [place] [address] Phone: [phone] Fax: [phone]   Patient reports affordability concerns with their medications: No  Patient reports access/transportation concerns to their pharmacy: No  Patient reports adherence concerns with their medications:  No    Lipids: Current medications: None  Medications tried in the past: Ezetimibe "horrible neck and shoulder pain". Trialed three separate times resulting in similar adverse effects each time.   Statin - extreme bilateral leg/muscle pain not resolved with rest or acetaminophen. Intolerable muscle aches even on lowest doses. Muscle pains resolved only after discontinuing the statin.  Simvastatin Atorvastatin Rosuvastatin  The ASCVD Risk score (Arnett DK, et al., 2019) failed to calculate for the following reasons:   The valid total cholesterol range is 130 to 320 mg/dL   Objective:  Lab Results  Component Value Date   CREATININE 1.10 05/09/2023   BUN 25 (H) 05/09/2023   NA 136 05/09/2023   K 3.9 05/09/2023   CL 99 05/09/2023   CO2 27 05/09/2023    Lab Results  Component Value Date   CHOL 370 (H) 05/09/2023   HDL 45.00 05/09/2023    LDLCALC 294 (H) 05/09/2023   LDLDIRECT 242.8 01/15/2012   TRIG 153.0 (H) 05/09/2023   CHOLHDL 8 05/09/2023    Medications Reviewed Today     Reviewed by Loree Fee, [facility] (Pharmacist) on 05/29/23 at 1105  Med List Status: <None>   Medication Order Taking? Sig Documenting Provider Last Dose Status Informant  ALPRAZolam (XANAX) 0.25 MG tablet [id number] Yes TAKE ONE TABLET BY MOUTH AT BEDTIME AS NEEDED FOR ANXIETY Tower, Audrie Gallus, MD Taking Active   Azelastine HCl (ASTEPRO) 0.15 % SOLN [id number] Yes Place 1 spray into the nose at bedtime. Joaquim Nam, MD Taking Active   Calcium Carbonate-Vitamin D (CALCIUM 600+D PO) [id number] Yes Take 1 tablet by mouth 2 (two) times daily.  [provider] Taking Active Self  EPINEPHrine 0.3 mg/0.3 mL IJ SOAJ injection [id number]  Inject 0.3 mg into the skin once. as needed. [provider]  Active Self           Med Note Floyce Stakes, MORGAN A   Thu Apr 05, 2016 10:51 AM)    estradiol (ESTRACE) 0.1 MG/GM vaginal cream [id number] Yes Place 1 Applicatorful vaginally 3 (three) times a week. [provider] Taking Active   levocetirizine (XYZAL) 5 MG tablet [id number] Yes Take 1 tablet by mouth daily. [provider] Taking Active   levothyroxine (SYNTHROID) 75 MCG tablet [id number] Yes Take 1 tablet (75 mcg total) by mouth daily. Tower, Audrie Gallus, MD Taking Active   Melatonin 10 MG TABS [id number] Yes Take 1 tablet by mouth at bedtime. [provider] Taking Active  Omega-3 Fatty Acids (FISH OIL) 1200 MG CAPS [id number] Yes Take 1 capsule by mouth daily.  [provider] Taking Active Self  omeprazole (PRILOSEC) 20 MG capsule [id number] Yes TAKE 2 CAPSULES BY MOUTH EVERY MORNING AND 1 CAPSULE IN THE [place] Tower, Audrie Gallus, MD Taking Active            Med Note Loree Fee   Wed May 29, 2023 11:05 AM) Taking 1 tablet twice daily  ramipril (ALTACE) 5 MG capsule [id number] Yes TAKE 1 CAPSULE BY MOUTH ONCE DAILY Tower,  Audrie Gallus, MD Taking Active   sertraline (ZOLOFT) 100 MG tablet [id number] Yes TAKE 1 TABLET BY MOUTH DAILY Tower, Audrie Gallus, MD Taking Active   triamterene-hydrochlorothiazide (MAXZIDE-25) 37.5-25 MG tablet [id number] Yes TAKE 1/2 TABLET EVERYDAY Tower, Audrie Gallus, MD Taking Active   Vibegron (GEMTESA) 75 MG TABS [id number] Yes Take 1 tablet (75 mg total) by mouth daily. Tower, Audrie Gallus, MD Taking Active             Assessment/Plan:   Hyperlipidemia/ASCVD Risk Reduction: uncontrolled with significantly elevated LDL of 294 mg/dL (direct) on 2/53/66. Patient has had adequate trials of three different statins at lowest doses as well as ezetimibe though has not been able to tolerate due to severe muscle and joint pains. Patient would significantly benefit form PCSK9i to reduce LDL and her overall ASCVD risk as per results of FOURIER and ODYSSEY trials. PCSK9i has been cost prohibitive in the past, though patient reports that current brand medications copay is reasonable for her (currently paying $128/90ds Gem[place]).  Reached out to CPhT team for assistance with investigating Repatha coverage/cost Reviewed long term complications of uncontrolled cholesterol and risk reduction strategies surrounding LDL reduction.  Reviewed PCSK9i medications (Repatha or Praluent), general dosing schedule, adverse effects and administration schedule Reviewed lifestyle recommendations including dietary modifications and maintaining activity as able.  Future considerations: [employer] for HLD is not currently open, though may reopen in the future which could offset the cost of PSCK9i   Follow Up Plan:  Will call patient with cost estimate once determined and review injection device in greater detail.   Loree Fee, PharmD Clinical Pharmacist [facility] [phone]

## 2023-05-29 NOTE — Telephone Encounter (Signed)
Pharmacy Patient Advocate Encounter  Received notification from [facility] that Prior Authorization for Prolia 60MG /ML syringes has been APPROVED from 05-29-2023 to 09-17-2023. Ran test claim, Copay is $64.00. This test claim was processed through [facility]- copay amounts may vary at other pharmacies due to pharmacy/plan contracts, or as the patient moves through the different stages of their insurance plan.   PA #/Case ID/Reference #: [id number]

## 2023-05-29 NOTE — Telephone Encounter (Signed)
Pharmacy Patient Advocate Encounter  Insurance verification completed.    The patient is insured through [employer] test claim for Repatha. Currently a quantity of is a 28 day supply and requires Prior Authorization.   This test claim was processed through [facility]- copay amounts may vary at other pharmacies due to pharmacy/plan contracts, or as the patient moves through the different stages of their insurance plan.

## 2023-05-30 MED ORDER — DENOSUMAB 60 MG/ML ~~LOC~~ SOSY: 60.0000 mg | PREFILLED_SYRINGE | Freq: Once | SUBCUTANEOUS | Status: AC

## 2023-05-30 NOTE — Telephone Encounter (Addendum)
Called patient reviewed all following information including appointment, Co pay due at time of visit and if pick up of injection is needed from outside pharmacy.     Out of pocket for patient:  $64.00   Lab appointment : 05/09/23  Nurse visit:  06/04/23  Lab order placed: No labs done   Prolia has been  [x]   Ordered sent to [facility] pharmacy   []   Script sent to local pharmacy for patient to bring   []   Script sent to Specialty pharmacy   []   Script sent to [facility] to deliver

## 2023-05-31 ENCOUNTER — Other Ambulatory Visit (HOSPITAL_COMMUNITY): Payer: Self-pay

## 2023-05-31 NOTE — Telephone Encounter (Signed)
Pharmacy Patient Advocate Encounter   Received notification from [facility] [employer]s Mess[employer] that prior authorization for repatha is required/requested.   Insurance verification completed.   The patient is insured through [employer] .   Per test claim: The current 05/31/23 day co-pay is, $40.00.  No PA needed at this time. This test claim was processed through [facility]- copay amounts may vary at other pharmacies due to pharmacy/plan contracts, or as the patient moves through the different stages of their insurance plan.

## 2023-06-03 ENCOUNTER — Other Ambulatory Visit: Payer: Self-pay | Admitting: Pharmacist

## 2023-06-03 MED ORDER — REPATHA SURECLICK 140 MG/ML ~~LOC~~ SOAJ: 140.0000 mg | SUBCUTANEOUS | 11 refills | Status: AC

## 2023-06-03 NOTE — Progress Notes (Signed)
Prior Authorization Submitted 05/31/23 Prior Authorization for Repatha: Approved (CMM Key: [id number]) Estimated Copay via [facility]: $40  Prescription pended to PCP: Repatha 14 mg subcutaneous every 14 days Medication counseling provided 05/29/23 Will call pharmacy to ensure covered and follow up with patient via phone   Follow up:  PharmD phone follow up this week to answer any questions regarding injection device.    Loree Fee, PharmD Clinical Pharmacist [facility] [phone]

## 2023-06-04 ENCOUNTER — Encounter: Payer: Self-pay | Admitting: Pharmacist

## 2023-06-04 DIAGNOSIS — H8112 Benign paroxysmal vertigo, left ear: Secondary | ICD-10-CM | POA: Diagnosis not present

## 2023-06-04 NOTE — Telephone Encounter (Signed)
I have not seen anyone with side effects so far  I am unaware of having to separate from other shots- anything I need to know?    Thanks

## 2023-06-06 ENCOUNTER — Ambulatory Visit (INDEPENDENT_AMBULATORY_CARE_PROVIDER_SITE_OTHER): Payer: Medicare PPO

## 2023-06-06 DIAGNOSIS — M85859 Other specified disorders of bone density and structure, unspecified thigh: Secondary | ICD-10-CM | POA: Diagnosis not present

## 2023-06-06 MED ORDER — DENOSUMAB 60 MG/ML ~~LOC~~ SOSY
60.0000 mg | PREFILLED_SYRINGE | Freq: Once | SUBCUTANEOUS | Status: AC
  Administered 2023-06-06: 60 mg via SUBCUTANEOUS

## 2023-06-06 NOTE — Progress Notes (Addendum)
Per orders of Dr. Roxy Manns, injection of Prolia  given by Donnamarie Poag in left arm.  Patient tolerated injection well. Patient informed we will contact her when time to set up for next injections.

## 2023-06-11 DIAGNOSIS — H8112 Benign paroxysmal vertigo, left ear: Secondary | ICD-10-CM | POA: Diagnosis not present

## 2023-06-26 ENCOUNTER — Other Ambulatory Visit (INDEPENDENT_AMBULATORY_CARE_PROVIDER_SITE_OTHER): Payer: Medicare PPO

## 2023-06-26 DIAGNOSIS — E039 Hypothyroidism, unspecified: Secondary | ICD-10-CM

## 2023-06-26 LAB — TSH: TSH: 4.24 u[IU]/mL (ref 0.35–5.50)

## 2023-07-02 ENCOUNTER — Other Ambulatory Visit: Payer: Self-pay | Admitting: Family Medicine

## 2023-07-02 NOTE — Telephone Encounter (Signed)
Name of Medication: Xanax Name of Pharmacy: [facility]  Last Fill or Written Date and Quantity: 12/27/22 #90 tab/ 0 refills Last Office Visit and Type: CPE on 05/15/23 Next Office Visit and Type: f/u on 08/19/23

## 2023-07-04 DIAGNOSIS — J301 Allergic rhinitis due to pollen: Secondary | ICD-10-CM | POA: Diagnosis not present

## 2023-07-05 ENCOUNTER — Other Ambulatory Visit: Payer: Self-pay | Admitting: Family Medicine

## 2023-07-11 DIAGNOSIS — H6982 Other specified disorders of Eustachian tube, left ear: Secondary | ICD-10-CM | POA: Diagnosis not present

## 2023-07-11 DIAGNOSIS — J301 Allergic rhinitis due to pollen: Secondary | ICD-10-CM | POA: Diagnosis not present

## 2023-07-11 DIAGNOSIS — H8112 Benign paroxysmal vertigo, left ear: Secondary | ICD-10-CM | POA: Diagnosis not present

## 2023-07-11 DIAGNOSIS — J328 Other chronic sinusitis: Secondary | ICD-10-CM | POA: Diagnosis not present

## 2023-07-16 ENCOUNTER — Ambulatory Visit
Admission: RE | Admit: 2023-07-16 | Discharge: 2023-07-16 | Disposition: A | Discharge status: Home / Self Care | Payer: Medicare PPO | Source: Ambulatory Visit | Attending: Family Medicine | Admitting: Family Medicine

## 2023-07-16 DIAGNOSIS — Z1231 Encounter for screening mammogram for malignant neoplasm of breast: Secondary | ICD-10-CM | POA: Insufficient documentation

## 2023-07-16 DIAGNOSIS — Z780 Asymptomatic menopausal state: Secondary | ICD-10-CM | POA: Insufficient documentation

## 2023-07-16 DIAGNOSIS — M8589 Other specified disorders of bone density and structure, multiple sites: Secondary | ICD-10-CM | POA: Diagnosis not present

## 2023-07-23 ENCOUNTER — Other Ambulatory Visit: Payer: Self-pay | Admitting: Family Medicine

## 2023-07-25 NOTE — Telephone Encounter (Signed)
Pharmacy Patient Advocate Encounter  Received notification from [facility] that Prior Authorization for Prolia has been APPROVED from 09/18/23 to 09/16/24   PA #/Case ID/Reference #: [id number]  Approval letter indexed to media tab

## 2023-07-29 DIAGNOSIS — H8112 Benign paroxysmal vertigo, left ear: Secondary | ICD-10-CM | POA: Diagnosis not present

## 2023-08-08 DIAGNOSIS — E119 Type 2 diabetes mellitus without complications: Secondary | ICD-10-CM | POA: Diagnosis not present

## 2023-08-19 ENCOUNTER — Encounter: Payer: Self-pay | Admitting: Family Medicine

## 2023-08-19 ENCOUNTER — Ambulatory Visit: Payer: Medicare PPO | Admitting: Family Medicine

## 2023-08-19 VITALS — BP 126/72 | HR 82 | Temp 97.9°F | Ht 63.75 in | Wt 175.4 lb

## 2023-08-19 DIAGNOSIS — I10 Essential (primary) hypertension: Secondary | ICD-10-CM | POA: Diagnosis not present

## 2023-08-19 DIAGNOSIS — E7800 Pure hypercholesterolemia, unspecified: Secondary | ICD-10-CM

## 2023-08-19 DIAGNOSIS — K219 Gastro-esophageal reflux disease without esophagitis: Secondary | ICD-10-CM | POA: Diagnosis not present

## 2023-08-19 DIAGNOSIS — R7303 Prediabetes: Secondary | ICD-10-CM

## 2023-08-19 DIAGNOSIS — E039 Hypothyroidism, unspecified: Secondary | ICD-10-CM | POA: Diagnosis not present

## 2023-08-19 LAB — COMPREHENSIVE METABOLIC PANEL
ALT: 17 U/L (ref 0–35)
AST: 19 U/L (ref 0–37)
Albumin: 4.2 g/dL (ref 3.5–5.2)
Alkaline Phosphatase: 61 U/L (ref 39–117)
BUN: 28 mg/dL — ABNORMAL HIGH (ref 6–23)
CO2: 28 meq/L (ref 19–32)
Calcium: 9.1 mg/dL (ref 8.4–10.5)
Chloride: 102 meq/L (ref 96–112)
Creatinine, Ser: 1.06 mg/dL (ref 0.40–1.20)
GFR: 50.76 mL/min — ABNORMAL LOW (ref >60.00)
Glucose, Bld: 129 mg/dL — ABNORMAL HIGH (ref 70–99)
Potassium: 4.3 meq/L (ref 3.5–5.1)
Sodium: 137 meq/L (ref 135–145)
Total Bilirubin: 0.6 mg/dL (ref 0.2–1.2)
Total Protein: 6.2 g/dL (ref 6.0–8.3)

## 2023-08-19 LAB — LIPID PANEL
Cholesterol: 203 mg/dL — ABNORMAL HIGH (ref 0–200)
HDL: 42.7 mg/dL (ref >39.00)
LDL Cholesterol: 126 mg/dL — ABNORMAL HIGH (ref 0–99)
NonHDL: 160.24
Total CHOL/HDL Ratio: 5
Triglycerides: 173 mg/dL — ABNORMAL HIGH (ref 0.0–149.0)
VLDL: 34.6 mg/dL (ref 0.0–40.0)

## 2023-08-19 LAB — POCT GLYCOSYLATED HEMOGLOBIN (HGB A1C): Hemoglobin A1C: 6.2 % — AB (ref 4.0–5.6)

## 2023-08-19 MED ORDER — OMEPRAZOLE 20 MG PO CPDR: 20.0000 mg | DELAYED_RELEASE_CAPSULE | Freq: Two times a day (BID) | ORAL | 3 refills | Status: AC

## 2023-08-19 MED ORDER — TRIAMTERENE-HCTZ 37.5-25 MG PO TABS: 0.5000 | ORAL_TABLET | Freq: Every day | ORAL | 3 refills | Status: AC

## 2023-08-19 NOTE — Patient Instructions (Addendum)
A1c is improved   Try to get most of your carbohydrates from produce (with the exception of white potatoes) and whole grains Eat less bread/pasta/rice/snack foods/cereals/sweets and other items from the middle of the grocery store (processed carbs) Keep up the good work   Make sure you drink enough fluids for kidney health  Aim for at least 60 oz daily (mostly water)   Labs for cholesterol and chemistries today   I will send Lillia Abed a message about repatha shots regarding refills and approval   Start some regular exercise when you can   Walking /pedaling/ water exercise for cardio   Add some strength training to your routine, this is important for bone and brain health and can reduce your risk of falls and help your body use insulin properly and regulate weight  Light weights, exercise bands , and internet videos are a good way to start  Yoga (chair or regular), machines , floor exercises or a gym with machines are also good options

## 2023-08-19 NOTE — Progress Notes (Signed)
Subjective:    Patient ID: [name], female    DOB: 04-Aug-1946, 77 y.o.   MRN: [id number]  HPI  Wt Readings from Last 3 Encounters:  08/19/23 175 lb 6 oz (79.5 kg)  05/15/23 174 lb (78.9 kg)  05/09/23 172 lb (78 kg)   30.34 kg/m  Vitals:   08/19/23 1020  BP: 126/72  Pulse: 82  Temp: 97.9 F (36.6 C)  SpO2: 97%   Pt presents for follow up of prediabetes and chronic health problems    Prediabetes  Last A1c was 6.5 / up from 6.2 prior   Now back to 6.2 today  Tried not to snack as much on carbs  Avoids dessert    HTN bp is stable today  No cp or palpitations or headaches or edema  No side effects to medicines  BP Readings from Last 3 Encounters:  08/19/23 126/72  05/15/23 128/72  08/23/22 126/68     Altace 5 mg daily  Triam/ hct 37.5-25 mg  1/2 pill daily   Lab Results  Component Value Date   NA 136 05/09/2023   K 3.9 05/09/2023   CO2 27 05/09/2023   GLUCOSE 128 (H) 05/09/2023   BUN 25 (H) 05/09/2023   CREATININE 1.10 05/09/2023   CALCIUM 9.4 05/09/2023   GFR 48.65 (L) 05/09/2023   GFRNONAA >60 03/08/2022    Hyperlipidemia Lab Results  Component Value Date   CHOL 370 (H) 05/09/2023   HDL 45.00 05/09/2023   LDLCALC 294 (H) 05/09/2023   LDLDIRECT 242.8 01/15/2012   TRIG 153.0 (H) 05/09/2023   CHOLHDL 8 05/09/2023   Intol of any dose of statin and zetia  Saw pharmacist Taking repatha 140 mg every 2 weeks  No side effects so far/tolerates well  Has had 6 doses   Hypothyroidism  Pt has no clinical changes No change in energy level/ hair or skin/ edema and no tremor Lab Results  Component Value Date   TSH 4.24 06/26/2023    This normalized Now taking levothyroxine 75 mcg daily     Prolia - for osteopenia   Vertigo finally got better  Had epely maneuver multiple times       Patient Active Problem List   Diagnosis Date Noted   Current use of proton pump inhibitor 04/22/2023   Lumbar degenerative disc disease 11/07/2021    Encounter for screening mammogram for breast cancer 04/21/2021   Osteopenia 02/13/2016   Routine general medical examination at a health care facility 01/23/2016   Estrogen deficiency 01/23/2016   Hypothyroid 01/23/2016   Mild depression 02/21/2015   H/O diabetes mellitus 01/04/2015   H/O arthritis 01/04/2015   History of hay fever 01/04/2015   Encounter for Medicare annual wellness exam 01/14/2013   Obesity (BMI 30-39.9) 01/15/2012   MENOPAUSAL SYNDROME 10/04/2008   Prediabetes 12/02/2007   Abnormal involuntary movement 12/02/2007   Hyperlipidemia 01/28/2007   Generalized anxiety disorder 01/28/2007   Essential hypertension 01/28/2007   GERD 01/28/2007   URINARY INCONTINENCE 01/28/2007   History of colonic polyps 01/28/2007   Herpes zoster 01/20/2007   DYSPHONIA 01/20/2007   COUGH, CHRONIC 01/20/2007   Allergy 01/20/2007   Past Medical History:  Diagnosis Date   Allergic rhinitis    gets allergy shot from Dr. Chestine Spore   Allergy    Anxiety    Arthritis    Diabetes mellitus without complication (HCC)    diet controlled   GERD (gastroesophageal reflux disease)    History of shingles  at age 35- mild   Hx of colonic polyp    Hyperglycemia    Hyperlipemia    Very high chol intol of statins-does not want to check it   Hypertension    Panic disorder    Tremor    from anx   Urinary incontinence    mixed   Voice disorder    Past Surgical History:  Procedure Laterality Date   ABDOMINAL HYSTERECTOMY     BLADDER SUSPENSION     BREAST BIOPSY Left 08/21/2022   stereo bx, calcs, "COIL" clip-BREAST CALCIFICATIONS, LEFT UPPER OUTER; STEREOTACTIC BIOPSY: - FIBROADENOMATOID CHANGE WITH ASSOCIATED COARSE CALCIFICATIONS. - NEGATIVE FOR ATYPIA AND MALIGNANCY.   BREAST BIOPSY Left 08/21/2022   MM LT BREAST BX W LOC DEV 1ST LESION IMAGE BX SPEC STEREO GUIDE 08/21/2022 [facility]   COLON SURGERY     COLONOSCOPY WITH PROPOFOL N/A 12/24/2016   Procedure: COLONOSCOPY WITH  PROPOFOL;  Surgeon: Scot Jun, MD;  Location: [facility];  Service: Endoscopy;  Laterality: N/A;   COLONOSCOPY WITH PROPOFOL N/A 04/17/2021   Procedure: COLONOSCOPY WITH PROPOFOL;  Surgeon: Regis Bill, MD;  Location: [facility]OSCOPY;  Service: Endoscopy;  Laterality: N/A;   ESOPHAGOGASTRODUODENOSCOPY (EGD) WITH PROPOFOL N/A 12/24/2016   Procedure: ESOPHAGOGASTRODUODENOSCOPY (EGD) WITH PROPOFOL;  Surgeon: Scot Jun, MD;  Location: [facility];  Service: Endoscopy;  Laterality: N/A;   EYE SURGERY Bilateral 2000   eye lens replacement   HAND SURGERY Left 2016   left long trigger finger release   JOINT REPLACEMENT     knee replacement - march 15   KNEE ARTHROSCOPY Left 04/26/2016   Procedure: ARTHROSCOPY KNEE;  Surgeon: Kennedy Bucker, MD;  Location: [facility];  Service: Orthopedics;  Laterality: Left;   MEDIAL PARTIAL KNEE REPLACEMENT Left 2016   NASAL SINUS SURGERY     PARTIAL HYSTERECTOMY     with fibroid   RECTOCELE REPAIR     SYNOVECTOMY Left 04/26/2016   Procedure: PARTIAL SYNOVECTOMY;  Surgeon: Kennedy Bucker, MD;  Location: [facility];  Service: Orthopedics;  Laterality: Left;   TONSILLECTOMY     Social History   Tobacco Use   Smoking status: Never    Passive exposure: Never   Smokeless tobacco: Never  Vaping Use   Vaping status: Never Used  Substance Use Topics   Alcohol use: Yes    Comment: rare   Drug use: No   Family History  Problem Relation Age of Onset   Diabetes Mother    Hyperlipidemia Mother    Hypertension Mother    Osteoarthritis Mother    Cancer Father        Colon   Leukemia Sister    Syncope episode Daughter    Breast cancer Neg Hx    Allergies  Allergen Reactions   Atorvastatin     REACTION: shoulder pain   Ezetimibe     REACTION: ?   Lisinopril     REACTION: muscle pain   Molds & Smuts    Penicillamine    Penicillins     REACTION: reaction not known Has patient had a PCN reaction causing immediate rash,  facial/tongue/throat swelling, SOB or lightheadedness with hypotension: unknown Has patient had a PCN reaction causing severe rash involving mucus membranes or skin necrosis: unknown Has patient had a PCN reaction that required hospitalization unsure Has patient had a PCN reaction occurring within the last 10 years: no If all of the above answers are "NO", then may proceed with Cephalosporin use.  Pneumococcal Vaccine Swelling    REACTION: severe local reaction   Pneumococcal Vaccine Polyvalent     REACTION: severe local reaction   Pollen Extract    Rosuvastatin     REACTION: leg pain   Simvastatin     REACTION: muscle pain   Current Outpatient Medications on File Prior to Visit  Medication Sig Dispense Refill   ALPRAZolam (XANAX) 0.25 MG tablet TAKE ONE TABLET BY MOUTH AT BEDTIME AS NEEDED FOR ANXIETY 90 tablet 0   Azelastine HCl (ASTEPRO) 0.15 % SOLN Place 1 spray into the nose at bedtime.     Calcium Carbonate-Vitamin D (CALCIUM 600+D PO) Take 1 tablet by mouth 2 (two) times daily.      EPINEPHrine 0.3 mg/0.3 mL IJ SOAJ injection Inject 0.3 mg into the skin once. as needed.     estradiol (ESTRACE) 0.1 MG/GM vaginal cream Place 1 Applicatorful vaginally 3 (three) times a week.     Evolocumab (REPATHA SURECLICK) 140 MG/ML SOAJ Inject 140 mg into the skin every 14 (fourteen) days. 6 mL 11   levocetirizine (XYZAL) 5 MG tablet Take 1 tablet by mouth daily.     levothyroxine (SYNTHROID) 75 MCG tablet Take 1 tablet (75 mcg total) by mouth daily. 90 tablet 3   Melatonin 10 MG TABS Take 1 tablet by mouth at bedtime.     Omega-3 Fatty Acids (FISH OIL) 1200 MG CAPS Take 1 capsule by mouth daily.      ramipril (ALTACE) 5 MG capsule TAKE 1 CAPSULE BY MOUTH ONCE DAILY 90 capsule 1   sertraline (ZOLOFT) 100 MG tablet TAKE 1 TABLET BY MOUTH DAILY 90 tablet 1   Vibegron (GEMTESA) 75 MG TABS Take 1 tablet (75 mg total) by mouth daily. 90 tablet 1   Current Facility-Administered Medications on File  Prior to Visit  Medication Dose Route Frequency Provider Last Rate Last Admin   denosumab (PROLIA) injection 60 mg  60 mg Subcutaneous Once Nyomi Howser A, MD        Review of Systems  Constitutional:  Negative for activity change, appetite change, fatigue, fever and unexpected weight change.  HENT:  Negative for congestion, ear pain, rhinorrhea, sinus pressure and sore throat.   Eyes:  Negative for pain, redness and visual disturbance.  Respiratory:  Negative for cough, shortness of breath and wheezing.   Cardiovascular:  Negative for chest pain and palpitations.  Gastrointestinal:  Negative for abdominal pain, blood in stool, constipation and diarrhea.  Endocrine: Negative for polydipsia and polyuria.  Genitourinary:  Negative for dysuria, frequency and urgency.  Musculoskeletal:  Negative for arthralgias, back pain and myalgias.  Skin:  Negative for pallor and rash.  Allergic/Immunologic: Negative for environmental allergies.  Neurological:  Negative for dizziness, syncope and headaches.  Hematological:  Negative for adenopathy. Does not bruise/bleed easily.  Psychiatric/Behavioral:  Negative for decreased concentration and dysphoric mood. The patient is not nervous/anxious.        Objective:   Physical Exam Constitutional:      General: She is not in acute distress.    Appearance: Normal appearance. She is well-developed. She is obese. She is not ill-appearing or diaphoretic.  HENT:     Head: Normocephalic and atraumatic.  Eyes:     Conjunctiva/sclera: Conjunctivae normal.     Pupils: Pupils are equal, round, and reactive to light.  Neck:     Thyroid: No thyromegaly.     Vascular: No carotid bruit or JVD.  Cardiovascular:     Rate and Rhythm:  Normal rate and regular rhythm.     Heart sounds: Normal heart sounds.     No gallop.  Pulmonary:     Effort: Pulmonary effort is normal. No respiratory distress.     Breath sounds: Normal breath sounds. No wheezing or rales.   Abdominal:     General: There is no distension or abdominal bruit.     Palpations: Abdomen is soft.  Musculoskeletal:     Cervical back: Normal range of motion and neck supple.     Right lower leg: No edema.     Left lower leg: No edema.  Lymphadenopathy:     Cervical: No cervical adenopathy.  Skin:    General: Skin is warm and dry.     Coloration: Skin is not pale.     Findings: No rash.  Neurological:     Mental Status: She is alert.     Coordination: Coordination normal.     Deep Tendon Reflexes: Reflexes are normal and symmetric. Reflexes normal.  Psychiatric:        Mood and Affect: Mood normal.           Assessment & Plan:   Problem List Items Addressed This Visit       Cardiovascular and Mediastinum   Essential hypertension    bp in fair control at this time  BP Readings from Last 1 Encounters:  08/19/23 126/72   No changes needed Most recent labs reviewed  Disc lifstyle change with low sodium diet and exercise  Doing better with lower dose of ace, no more dizziness Plan to continue Altace 5 mg daily  Triam/hct 37.5-25 mg daily  Encouraged more fluid intake for renal health      Relevant Medications   triamterene-hydrochlorothiazide (MAXZIDE-25) 37.5-25 MG tablet   Other Relevant Orders   Comprehensive metabolic panel     Digestive   GERD    Refilled omeprazole 20 mg  Tolerating bid dosing  Would like to go down more in the future if able       Relevant Medications   omeprazole (PRILOSEC) 20 MG capsule     Endocrine   Hypothyroid    Lab Results  Component Value Date   TSH 4.24 06/26/2023   Continues levothyroxine 75 mcg daily        Other   Prediabetes - Primary    Lab Results  Component Value Date   HGBA1C 6.2 (A) 08/19/2023   Improved with less snacking and sweets Commended  disc imp of low glycemic diet and wt loss to prevent DM2       Relevant Orders   POCT HgB A1C (Completed)   Hyperlipidemia    Disc goals for lipids  and reasons to control them Rev last labs with pt Rev low sat fat diet in detail   Now on repatha  Lab today  Expect improvement  Last LDL was 294      Relevant Medications   triamterene-hydrochlorothiazide (MAXZIDE-25) 37.5-25 MG tablet   Other Relevant Orders   Comprehensive metabolic panel   Lipid panel

## 2023-08-19 NOTE — Assessment & Plan Note (Signed)
Disc goals for lipids and reasons to control them Rev last labs with pt Rev low sat fat diet in detail   Now on repatha  Lab today  Expect improvement  Last LDL was 294

## 2023-08-19 NOTE — Assessment & Plan Note (Signed)
bp in fair control at this time  BP Readings from Last 1 Encounters:  08/19/23 126/72   No changes needed Most recent labs reviewed  Disc lifstyle change with low sodium diet and exercise  Doing better with lower dose of ace, no more dizziness Plan to continue Altace 5 mg daily  Triam/hct 37.5-25 mg daily  Encouraged more fluid intake for renal health

## 2023-08-19 NOTE — Assessment & Plan Note (Signed)
Lab Results  Component Value Date   HGBA1C 6.2 (A) 08/19/2023   Improved with less snacking and sweets Commended  disc imp of low glycemic diet and wt loss to prevent DM2

## 2023-08-19 NOTE — Assessment & Plan Note (Signed)
Lab Results  Component Value Date   TSH 4.24 06/26/2023   Continues levothyroxine 75 mcg daily

## 2023-08-19 NOTE — Assessment & Plan Note (Signed)
Refilled omeprazole 20 mg  Tolerating bid dosing  Would like to go down more in the future if able

## 2023-09-05 ENCOUNTER — Encounter: Payer: Self-pay | Admitting: Family Medicine

## 2023-09-05 MED ORDER — ALPRAZOLAM 0.25 MG PO TABS: 0.5000 mg | ORAL_TABLET | Freq: Every evening | ORAL | 0 refills | Status: AC | PRN

## 2023-09-05 NOTE — Telephone Encounter (Signed)
Let her know I sent in another 60 pills so she can take 2 at bedtime if needed for severe anxiety  We cannot continue that indefinitely due to risks   Please schedule follow up in office when able to talk more   So sorry to hear that

## 2023-09-05 NOTE — Telephone Encounter (Signed)
Called patient and reviewed all information. Patient verbalized understanding. Will call if any further questions.  Patient will call back once things settle down to schedule appt.

## 2023-09-12 ENCOUNTER — Other Ambulatory Visit: Payer: Self-pay | Admitting: Family Medicine

## 2023-10-07 DIAGNOSIS — J301 Allergic rhinitis due to pollen: Secondary | ICD-10-CM | POA: Diagnosis not present

## 2023-10-10 DIAGNOSIS — J301 Allergic rhinitis due to pollen: Secondary | ICD-10-CM | POA: Diagnosis not present

## 2023-10-14 ENCOUNTER — Other Ambulatory Visit: Payer: Self-pay | Admitting: Family Medicine

## 2023-10-14 NOTE — Telephone Encounter (Signed)
Last refill  04/18/23 #90 w/ 1 refill   Last CPE 05/15/23, F/u on 09/06/23

## 2023-10-21 ENCOUNTER — Other Ambulatory Visit: Payer: Self-pay | Admitting: Family Medicine

## 2023-11-04 ENCOUNTER — Telehealth: Payer: Self-pay | Admitting: Family Medicine

## 2023-11-04 NOTE — Telephone Encounter (Signed)
Copied from CRM [id number]. Topic: Clinical - Medication Question >> Nov 04, 2023  2:31 PM Alvino Blood C wrote: Reason for CRM: Duwayne Heck from  pharmacy  would like a call back at [phone]. She is requesting the following medication: levothyroxine (SYNTHROID) 75 MCG tablet be switched to a different manufacturer.

## 2023-11-04 NOTE — Telephone Encounter (Signed)
If they do change the generic we will need the patient's ok and also re check TSH 2-3 week after the change in med  Is ok with me as long as we re check

## 2023-11-05 NOTE — Telephone Encounter (Signed)
Left VM giving okay to pharmacy as long as pt is okay with it.  Called pt but no answer so left VM requesting pt to call the office back

## 2023-11-11 NOTE — Telephone Encounter (Signed)
 Per mychart pt has moved and is getting est with a new provider in [place] city

## 2023-11-14 DIAGNOSIS — R739 Hyperglycemia, unspecified: Secondary | ICD-10-CM | POA: Diagnosis not present

## 2023-11-14 DIAGNOSIS — M818 Other osteoporosis without current pathological fracture: Secondary | ICD-10-CM | POA: Diagnosis not present

## 2023-11-14 DIAGNOSIS — E785 Hyperlipidemia, unspecified: Secondary | ICD-10-CM | POA: Diagnosis not present

## 2023-11-14 DIAGNOSIS — F419 Anxiety disorder, unspecified: Secondary | ICD-10-CM | POA: Diagnosis not present

## 2023-11-14 DIAGNOSIS — R32 Unspecified urinary incontinence: Secondary | ICD-10-CM | POA: Diagnosis not present

## 2023-11-14 DIAGNOSIS — E559 Vitamin D deficiency, unspecified: Secondary | ICD-10-CM | POA: Diagnosis not present

## 2023-11-14 DIAGNOSIS — Z889 Allergy status to unspecified drugs, medicaments and biological substances status: Secondary | ICD-10-CM | POA: Diagnosis not present

## 2023-11-14 DIAGNOSIS — E039 Hypothyroidism, unspecified: Secondary | ICD-10-CM | POA: Diagnosis not present

## 2023-11-14 DIAGNOSIS — I10 Essential (primary) hypertension: Secondary | ICD-10-CM | POA: Diagnosis not present

## 2023-11-20 NOTE — Telephone Encounter (Signed)
 Unfortunately I don't think that is something we can do because of insurance  Thanks Joellen

## 2023-11-20 NOTE — Telephone Encounter (Signed)
 Copied from CRM [id number]. Topic: Clinical - Medical Advice >> Nov 20, 2023 11:57 AM [place] J wrote: Reason for CRM: Patient's daughter Jill Side calling in stating that the patient's husband recently passed so the patient is staying with her for the time being. Jill Side stated that the patient is due for her prolia injection, but due to the traveling distance she was wondering if a phone call or referral to [facility] on [address] could be sent through. She did state that they have an appointment with a local endocrinologist but would like to see if there is a work around we could do with the infusion center for something soon. Please call Jill Side with the soonest update: [phone]

## 2023-11-20 NOTE — Telephone Encounter (Signed)
 Due to benefit verification and documentation I'm not sure this is option.

## 2023-11-27 NOTE — Telephone Encounter (Signed)
 Daughter not listed on dpr. Called number on file it is not working at this time. Will try to call later.

## 2023-11-28 ENCOUNTER — Telehealth: Payer: Self-pay | Admitting: *Deleted

## 2023-11-28 NOTE — Telephone Encounter (Signed)
 We try to avoid being late- I don't think a few weeks would be a big deal but would not put off longer than that  Thanks for letting me know

## 2023-11-28 NOTE — Telephone Encounter (Signed)
Number not working at this time.  °

## 2023-11-28 NOTE — Telephone Encounter (Signed)
Daughter notified of Dr. Tower's comments  

## 2023-11-28 NOTE — Telephone Encounter (Signed)
 Copied from CRM [id number]. Topic: Clinical - Medical Advice >> Nov 28, 2023  2:34 PM Sim Boast F wrote: Reason for CRM: Patient daughter Alsion called, patient has moved in with her and is establishing care with endocrinologist 3/31 to take over prolia injections, however, patient is due for prolia injection 12/04/23 Jill Side wants to know if it's okay for patient to prolong prolia injection until she establishes new care or should she try to come in the office? Her call back number is [phone] but if you call doesn't go through again for some reason please call patients mobile number on file [phone] (M)  and leave detailed message.

## 2023-12-02 DIAGNOSIS — J3089 Other allergic rhinitis: Secondary | ICD-10-CM | POA: Diagnosis not present

## 2023-12-02 DIAGNOSIS — J3081 Allergic rhinitis due to animal (cat) (dog) hair and dander: Secondary | ICD-10-CM | POA: Diagnosis not present

## 2023-12-02 DIAGNOSIS — J301 Allergic rhinitis due to pollen: Secondary | ICD-10-CM | POA: Diagnosis not present

## 2023-12-03 NOTE — Telephone Encounter (Signed)
 Have called number on file still not working sending my chart message to reach out to office for further instructions.

## 2023-12-09 DIAGNOSIS — J301 Allergic rhinitis due to pollen: Secondary | ICD-10-CM | POA: Diagnosis not present

## 2023-12-16 DIAGNOSIS — Z79899 Other long term (current) drug therapy: Secondary | ICD-10-CM | POA: Diagnosis not present

## 2023-12-16 DIAGNOSIS — M8580 Other specified disorders of bone density and structure, unspecified site: Secondary | ICD-10-CM | POA: Diagnosis not present

## 2023-12-16 DIAGNOSIS — E559 Vitamin D deficiency, unspecified: Secondary | ICD-10-CM | POA: Diagnosis not present

## 2023-12-16 DIAGNOSIS — E785 Hyperlipidemia, unspecified: Secondary | ICD-10-CM | POA: Diagnosis not present

## 2023-12-16 DIAGNOSIS — M818 Other osteoporosis without current pathological fracture: Secondary | ICD-10-CM | POA: Diagnosis not present

## 2023-12-16 DIAGNOSIS — M8589 Other specified disorders of bone density and structure, multiple sites: Secondary | ICD-10-CM | POA: Diagnosis not present

## 2023-12-16 DIAGNOSIS — K219 Gastro-esophageal reflux disease without esophagitis: Secondary | ICD-10-CM | POA: Diagnosis not present

## 2023-12-16 DIAGNOSIS — E039 Hypothyroidism, unspecified: Secondary | ICD-10-CM | POA: Diagnosis not present

## 2023-12-16 DIAGNOSIS — R739 Hyperglycemia, unspecified: Secondary | ICD-10-CM | POA: Diagnosis not present

## 2024-01-13 DIAGNOSIS — M8589 Other specified disorders of bone density and structure, multiple sites: Secondary | ICD-10-CM | POA: Diagnosis not present

## 2024-01-13 DIAGNOSIS — M8580 Other specified disorders of bone density and structure, unspecified site: Secondary | ICD-10-CM | POA: Diagnosis not present

## 2024-02-16 ENCOUNTER — Encounter: Payer: Self-pay | Admitting: Family Medicine

## 2024-02-18 ENCOUNTER — Ambulatory Visit: Payer: Medicare PPO | Admitting: Family Medicine

## 2024-03-09 DIAGNOSIS — J22 Unspecified acute lower respiratory infection: Secondary | ICD-10-CM | POA: Diagnosis not present

## 2024-03-13 DIAGNOSIS — J40 Bronchitis, not specified as acute or chronic: Secondary | ICD-10-CM | POA: Diagnosis not present

## 2024-03-13 DIAGNOSIS — R091 Pleurisy: Secondary | ICD-10-CM | POA: Diagnosis not present

## 2024-03-13 DIAGNOSIS — Z8701 Personal history of pneumonia (recurrent): Secondary | ICD-10-CM | POA: Diagnosis not present

## 2024-05-14 ENCOUNTER — Other Ambulatory Visit: Payer: Medicare PPO

## 2024-05-19 ENCOUNTER — Encounter: Payer: Medicare PPO | Admitting: Family Medicine

## 2024-08-06 ENCOUNTER — Telehealth: Payer: Self-pay | Admitting: Family Medicine

## 2024-08-06 NOTE — Telephone Encounter (Signed)
 Per cancellation notes on 05/19/2024 Patient has moved to [place], [place]. No longer a patient at [facility].   [facility] Care Guide [facility] AWV TEAM Direct Dial: [phone]

## 2024-08-27 NOTE — Telephone Encounter (Signed)
 PCP removed as patient has moved.
